# Patient Record
Sex: Female | Born: 1955 | Race: Black or African American | Hispanic: No | Marital: Married | Smoking: Never smoker
Health system: Southern US, Community
[De-identification: ages and names within clinical notes are randomized; demographics above are authoritative.]

## PROBLEM LIST (undated history)

## (undated) DIAGNOSIS — Z973 Presence of spectacles and contact lenses: Secondary | ICD-10-CM

## (undated) DIAGNOSIS — M940 Chondrocostal junction syndrome [Tietze]: Secondary | ICD-10-CM

## (undated) DIAGNOSIS — M5412 Radiculopathy, cervical region: Secondary | ICD-10-CM

## (undated) DIAGNOSIS — Z9109 Other allergy status, other than to drugs and biological substances: Secondary | ICD-10-CM

## (undated) DIAGNOSIS — T7840XA Allergy, unspecified, initial encounter: Secondary | ICD-10-CM

## (undated) DIAGNOSIS — M722 Plantar fascial fibromatosis: Secondary | ICD-10-CM

## (undated) DIAGNOSIS — M549 Dorsalgia, unspecified: Secondary | ICD-10-CM

## (undated) DIAGNOSIS — M67912 Unspecified disorder of synovium and tendon, left shoulder: Secondary | ICD-10-CM

## (undated) DIAGNOSIS — M797 Fibromyalgia: Secondary | ICD-10-CM

## (undated) DIAGNOSIS — G43909 Migraine, unspecified, not intractable, without status migrainosus: Secondary | ICD-10-CM

## (undated) DIAGNOSIS — G5601 Carpal tunnel syndrome, right upper limb: Secondary | ICD-10-CM

## (undated) DIAGNOSIS — M1990 Unspecified osteoarthritis, unspecified site: Secondary | ICD-10-CM

## (undated) HISTORY — DX: Plantar fascial fibromatosis: M72.2

## (undated) HISTORY — DX: Other allergy status, other than to drugs and biological substances: Z91.09

## (undated) HISTORY — DX: Unspecified disorder of synovium and tendon, left shoulder: M67.912

## (undated) HISTORY — DX: Unspecified osteoarthritis, unspecified site: M19.90

## (undated) HISTORY — DX: Chondrocostal junction syndrome (tietze): M94.0

## (undated) HISTORY — DX: Fibromyalgia: M79.7

## (undated) HISTORY — DX: Radiculopathy, cervical region: M54.12

## (undated) HISTORY — DX: Carpal tunnel syndrome, right upper limb: G56.01

## (undated) HISTORY — PX: COLONOSCOPY: SHX174

## (undated) HISTORY — DX: Migraine, unspecified, not intractable, without status migrainosus: G43.909

## (undated) HISTORY — DX: Allergy, unspecified, initial encounter: T78.40XA

## (undated) HISTORY — DX: Dorsalgia, unspecified: M54.9

## (undated) HISTORY — PX: TUBAL LIGATION: SHX77

## (undated) HISTORY — PX: UPPER GASTROINTESTINAL ENDOSCOPY: SHX188

---

## 2010-11-11 ENCOUNTER — Encounter: Payer: Self-pay | Admitting: Family Medicine

## 2010-11-11 ENCOUNTER — Other Ambulatory Visit (HOSPITAL_COMMUNITY)
Admission: RE | Admit: 2010-11-11 | Discharge: 2010-11-11 | Disposition: A | Discharge status: Home / Self Care | Payer: 59 | Source: Ambulatory Visit | Attending: Family Medicine | Admitting: Family Medicine

## 2010-11-11 ENCOUNTER — Ambulatory Visit (INDEPENDENT_AMBULATORY_CARE_PROVIDER_SITE_OTHER): Payer: 59 | Admitting: Family Medicine

## 2010-11-11 VITALS — BP 118/80 | HR 72 | Temp 99.0°F | Ht 65.0 in | Wt 165.6 lb

## 2010-11-11 DIAGNOSIS — Z0000 Encounter for general adult medical examination without abnormal findings: Secondary | ICD-10-CM

## 2010-11-11 DIAGNOSIS — Z01419 Encounter for gynecological examination (general) (routine) without abnormal findings: Secondary | ICD-10-CM | POA: Insufficient documentation

## 2010-11-11 DIAGNOSIS — G5600 Carpal tunnel syndrome, unspecified upper limb: Secondary | ICD-10-CM | POA: Insufficient documentation

## 2010-11-11 DIAGNOSIS — J309 Allergic rhinitis, unspecified: Secondary | ICD-10-CM

## 2010-11-11 DIAGNOSIS — IMO0002 Reserved for concepts with insufficient information to code with codable children: Secondary | ICD-10-CM

## 2010-11-11 DIAGNOSIS — K219 Gastro-esophageal reflux disease without esophagitis: Secondary | ICD-10-CM

## 2010-11-11 DIAGNOSIS — M1710 Unilateral primary osteoarthritis, unspecified knee: Secondary | ICD-10-CM

## 2010-11-11 DIAGNOSIS — M179 Osteoarthritis of knee, unspecified: Secondary | ICD-10-CM

## 2010-11-11 DIAGNOSIS — Z1239 Encounter for other screening for malignant neoplasm of breast: Secondary | ICD-10-CM

## 2010-11-11 DIAGNOSIS — Z780 Asymptomatic menopausal state: Secondary | ICD-10-CM

## 2010-11-11 DIAGNOSIS — J302 Other seasonal allergic rhinitis: Secondary | ICD-10-CM

## 2010-11-11 DIAGNOSIS — M549 Dorsalgia, unspecified: Secondary | ICD-10-CM

## 2010-11-11 HISTORY — DX: Carpal tunnel syndrome, unspecified upper limb: G56.00

## 2010-11-11 MED ORDER — NAPHAZOLINE HCL 0.1 % OP SOLN: 2.0000 [drp] | Freq: Four times a day (QID) | OPHTHALMIC | Status: AC | PRN

## 2010-11-11 MED ORDER — NAPROXEN-ESOMEPRAZOLE 500-20 MG PO TBEC: 1.0000 | DELAYED_RELEASE_TABLET | Freq: Two times a day (BID) | ORAL | Status: DC

## 2010-11-11 NOTE — Patient Instructions (Signed)

## 2010-11-11 NOTE — Progress Notes (Signed)
Subjective:     Kimberly Escobar is a 55 y.o. female and is here for a comprehensive physical exam. The patient reports problems - multiple. Pt needs referral for ortho for oa and back pain,  GI for Genella Rife,  And Hydrographic surveyor for CTS.  Pt had all these appointment before she moved but had to cancel all when moving down here.     History   Social History  . Marital Status: Married    Spouse Name: N/A    Number of Children: 4  . Years of Education: N/A   Occupational History  . housewife Other   Social History Main Topics  . Smoking status: Never Smoker   . Smokeless tobacco: Never Used  . Alcohol Use: 1.8 oz/week    3 Glasses of wine per week  . Drug Use: No  . Sexually Active: Yes -- Female partner(s)   Other Topics Concern  . Not on file   Social History Narrative   Exercise --walk 3x a week   No health maintenance topics applied.  The following portions of the patient's history were reviewed and updated as appropriate: allergies, current medications, past family history, past medical history, past social history, past surgical history and problem list.  Review of Systems Review of Systems  Constitutional: Negative for activity change, appetite change and fatigue.  HENT: Negative for hearing loss, congestion, tinnitus and ear discharge.  dentist q64m Eyes: Negative for visual disturbance (see optho q1y -- vision corrected to 20/20 with glasses).  Respiratory: Negative for cough, chest tightness and shortness of breath.   Cardiovascular: Negative for chest pain, palpitations and leg swelling.  Gastrointestinal: Negative for abdominal pain, diarrhea, constipation and abdominal distention.  Genitourinary: Negative for urgency, frequency, decreased urine volume and difficulty urinating.  Musculoskeletal: Negative for back pain, arthralgias and gait problem.  Skin: Negative for color change, pallor and rash.  Neurological: Negative for dizziness, light-headedness, numbness and  headaches.  Hematological: Negative for adenopathy. Does not bruise/bleed easily.  Psychiatric/Behavioral: Negative for suicidal ideas, confusion, sleep disturbance, self-injury, dysphoric mood, decreased concentration and agitation.       Objective:    BP 118/80  Pulse 72  Temp(Src) 99 F (37.2 C) (Oral)  Ht 5\' 5"  (1.651 m)  Wt 165 lb 9.6 oz (75.116 kg)  BMI 27.56 kg/m2  SpO2 97% General appearance: alert, cooperative, appears stated age and no distress Head: Normocephalic, without obvious abnormality, atraumatic Eyes: conjunctivae/corneas clear. PERRL, EOM's intact. Fundi benign. Ears: normal TM's and external ear canals both ears Nose: Nares normal. Septum midline. Mucosa normal. No drainage or sinus tenderness. Throat: lips, mucosa, and tongue normal; teeth and gums normal Neck: no adenopathy, no carotid bruit, no JVD, supple, symmetrical, trachea midline and thyroid not enlarged, symmetric, no tenderness/mass/nodules Back: + low back pain ,  difficulty lying down Lungs: clear to auscultation bilaterally Breasts: normal appearance, no masses or tenderness Heart: regular rate and rhythm, S1, S2 normal, no murmur, click, rub or gallop Abdomen: soft, non-tender; bowel sounds normal; no masses,  no organomegaly Pelvic: cervix normal in appearance, external genitalia normal, no adnexal masses or tenderness, no cervical motion tenderness, rectovaginal septum normal, uterus normal size, shape, and consistency and vagina normal without discharge Extremities: extremities normal, atraumatic, no cyanosis or edema Pulses: 2+ and symmetric Skin: Skin color, texture, turgor normal. No rashes or lesions Lymph nodes: Cervical, supraclavicular, and axillary nodes normal. Neurologic: Alert and oriented X 3, normal strength and tone. Normal symmetric reflexes. Normal coordination and gait  psych-- no depression, no anxiety    Assessment:    Healthy female exam.  OA--- f/u ortho Low back  pain---pt saw ortho in [place]---we will refer her to ortho and send for records allergies  ---naphcon eye drops gerd---- pt needed egd--had it scheduled in [place] but had to cancel it when she moved--we are sending for her records Plan:  Check fasting labs Schedule--mammo, bmd ghm utd    See After Visit Summary for Counseling Recommendations

## 2010-11-12 ENCOUNTER — Encounter: Payer: Self-pay | Admitting: Gastroenterology

## 2010-11-18 ENCOUNTER — Other Ambulatory Visit: Payer: Self-pay | Admitting: Family Medicine

## 2010-11-18 DIAGNOSIS — Z0000 Encounter for general adult medical examination without abnormal findings: Secondary | ICD-10-CM

## 2010-11-19 ENCOUNTER — Other Ambulatory Visit (INDEPENDENT_AMBULATORY_CARE_PROVIDER_SITE_OTHER): Payer: 59

## 2010-11-19 DIAGNOSIS — Z0000 Encounter for general adult medical examination without abnormal findings: Secondary | ICD-10-CM

## 2010-11-19 LAB — LIPID PANEL
Cholesterol: 197 mg/dL (ref 0–200)
LDL Cholesterol: 121 mg/dL — ABNORMAL HIGH (ref 0–99)
Triglycerides: 74 mg/dL (ref 0.0–149.0)
VLDL: 14.8 mg/dL (ref 0.0–40.0)

## 2010-11-19 LAB — HEPATIC FUNCTION PANEL
Albumin: 4.4 g/dL (ref 3.5–5.2)
Total Protein: 8.1 g/dL (ref 6.0–8.3)

## 2010-11-19 LAB — CBC WITH DIFFERENTIAL/PLATELET
Basophils Relative: 0.6 % (ref 0.0–3.0)
Eosinophils Relative: 0.9 % (ref 0.0–5.0)
HCT: 38.4 % (ref 36.0–46.0)
Hemoglobin: 12.9 g/dL (ref 12.0–15.0)
Lymphs Abs: 2.1 10*3/uL (ref 0.7–4.0)
MCV: 96.3 fl (ref 78.0–100.0)
Monocytes Absolute: 0.3 10*3/uL (ref 0.1–1.0)
Monocytes Relative: 7.1 % (ref 3.0–12.0)
Platelets: 267 10*3/uL (ref 150.0–400.0)
RBC: 3.99 Mil/uL (ref 3.87–5.11)
WBC: 4.6 10*3/uL (ref 4.5–10.5)

## 2010-11-19 LAB — BASIC METABOLIC PANEL
BUN: 17 mg/dL (ref 6–23)
Chloride: 107 mEq/L (ref 96–112)
GFR: 110.88 mL/min (ref >60.00)
Potassium: 3.9 mEq/L (ref 3.5–5.1)
Sodium: 141 mEq/L (ref 135–145)

## 2010-11-19 NOTE — Progress Notes (Signed)
Labs only

## 2010-12-03 ENCOUNTER — Ambulatory Visit: Payer: 59 | Admitting: Gastroenterology

## 2010-12-06 ENCOUNTER — Encounter: Payer: Self-pay | Admitting: Family Medicine

## 2010-12-10 ENCOUNTER — Ambulatory Visit (INDEPENDENT_AMBULATORY_CARE_PROVIDER_SITE_OTHER): Payer: 59 | Admitting: Gastroenterology

## 2010-12-10 ENCOUNTER — Encounter: Payer: Self-pay | Admitting: Gastroenterology

## 2010-12-10 VITALS — BP 116/70 | HR 72 | Ht 65.0 in | Wt 163.4 lb

## 2010-12-10 DIAGNOSIS — K219 Gastro-esophageal reflux disease without esophagitis: Secondary | ICD-10-CM

## 2010-12-10 HISTORY — DX: Gastro-esophageal reflux disease without esophagitis: K21.9

## 2010-12-10 NOTE — Progress Notes (Signed)
HPI: This is a   very pleasant 55 year old woman who I am meeting for the first time today.  She has been "spitting up after she eats." depends on amount of food that she eats.  In a week she will "spit up" twice.  Large meals cuase it more often.  Liquidy belching, sometimes has acid taste to it.  No particular types of foods.    This has been going on for 3 years  She was given some reglan many month ago, she tried it a few times without noticing any improving.  Overall her wieght is up slightly.  No overt GI bleeding.  No significant pains.  Recent cbc was normal.  Recently moved from [place], husband relocated.   She had a colonoscopy about 2 years, no polyps were found.  She is on vivomovo bid.   Review of systems: Pertinent positive and negative review of systems were noted in the above HPI section. Complete review of systems was performed and was otherwise normal.    Past Medical History  Diagnosis Date  . Arthritis   . Migraines   . Environmental allergies     Past Surgical History  Procedure Date  . Cesarean section     x's 4    Current Outpatient Prescriptions  Medication Sig Dispense Refill  . cyclobenzaprine (FLEXERIL) 10 MG tablet Take 10 mg by mouth 3 (three) times daily as needed.        . metoCLOPramide (REGLAN) 10 MG tablet Take 10 mg by mouth 3 (three) times daily.        . naproxen (NAPROSYN) 375 MG tablet Take 375 mg by mouth 2 (two) times daily with a meal.        . Naproxen-Esomeprazole (VIMOVO) 500-20 MG TBEC Take 1 tablet by mouth 2 (two) times daily.  60 tablet  5  . traMADol (ULTRAM) 50 MG tablet Take 50 mg by mouth every 6 (six) hours as needed. Maximum dose= 8 tablets per day         Allergies as of 12/10/2010  . (No Known Allergies)    Family History  Problem Relation Age of Onset  . Arthritis    . Heart disease Mother   . Heart disease Sister     Pacemaker  . Hypertension Mother   . Hypertension Sister   . Stroke Sister     oldest  sibling  . Breast cancer Sister     Oldest sibling    History   Social History  . Marital Status: Married    Spouse Name: N/A    Number of Children: 4  . Years of Education: N/A   Occupational History  . housewife Other   Social History Main Topics  . Smoking status: Never Smoker   . Smokeless tobacco: Never Used  . Alcohol Use: 1.8 oz/week    3 Glasses of wine per week  . Drug Use: No  . Sexually Active: Yes -- Female partner(s)   Other Topics Concern  . Not on file   Social History Narrative   Exercise --walk 3x a week       Physical Exam: BP 116/70  Pulse 72  Ht 5\' 5"  (1.651 m)  Wt 163 lb 6.4 oz (74.118 kg)  BMI 27.19 kg/m2 Constitutional: generally well-appearing Psychiatric: alert and oriented x3 Eyes: extraocular movements intact Mouth: oral pharynx moist, no lesions Neck: supple no lymphadenopathy Cardiovascular: heart regular rate and rhythm Lungs: clear to auscultation bilaterally Abdomen: soft, nontender, nondistended, no  obvious ascites, no peritoneal signs, normal bowel sounds Extremities: no lower extremity edema bilaterally Skin: no lesions on visible extremities    Assessment and plan: 55 y.o. female with  intermittent vomiting, regurgitation likely GERD related  She takes 1 g of naproxen a day for her knees and this concerned because gastritis, peptic ulcer disease. Tylenol does not help unfortunately. I think we should proceed with EGD at her soonest convenience to check for peptic ulcer disease, H. pylori, gastritis, significant GERD damage. Further recommendations based on those findings. She was recommended to take Reglan several months ago by a doctor in [place] before she moved here, she has not been taking it very often and I removed it from her medication list.

## 2010-12-10 NOTE — Patient Instructions (Addendum)
We will get records from Dr. Corwin Levins in [facility]) for your 2010 colonoscopy. You will be set up for an upper endoscopy. GERD handout.    Gastroesophageal reflux disease (GERD) happens when acid from your stomach flows up into the esophagus. When acid comes in contact with the esophagus, the acid causes soreness (inflammation) in the esophagus. Over time, GERD may create small holes (ulcers) in the lining of the esophagus. CAUSES   Increased body weight. This puts pressure on the stomach, making acid rise from the stomach into the esophagus.   Smoking. This increases acid production in the stomach.   Drinking alcohol. This causes decreased pressure in the lower esophageal sphincter (valve or ring of muscle between the esophagus and stomach), allowing acid from the stomach into the esophagus.   Late evening meals and a full stomach. This increases pressure and acid production in the stomach.   A malformed lower esophageal sphincter.  Sometimes, no cause is found. SYMPTOMS   Burning pain in the lower part of the mid-chest behind the breastbone and in the mid-stomach area. This may occur twice a week or more often.   Trouble swallowing.   Sore throat.   Dry cough.   Asthma-like symptoms including chest tightness, shortness of breath, or wheezing.  DIAGNOSIS  Your caregiver may be able to diagnose GERD based on your symptoms. In some cases, X-rays and other tests may be done to check for complications or to check the condition of your stomach and esophagus. TREATMENT  Your caregiver may recommend over-the-counter or prescription medicines to help decrease acid production. Ask your caregiver before starting or adding any new medicines.  HOME CARE INSTRUCTIONS   Change the factors that you can control. Ask your caregiver for guidance concerning weight loss, quitting smoking, and alcohol consumption.   Avoid foods and drinks that make your symptoms worse, such as:     Caffeine or alcoholic drinks.   Chocolate.   Peppermint or mint flavorings.   Garlic and onions.   Spicy foods.   Citrus fruits, such as oranges, lemons, or limes.   Tomato-based foods such as sauce, chili, salsa, and pizza.   Fried and fatty foods.   Avoid lying down for the 3 hours prior to your bedtime or prior to taking a nap.   Eat small, frequent meals instead of large meals.   Wear loose-fitting clothing. Do not wear anything tight around your waist that causes pressure on your stomach.   Raise the head of your bed 6 to 8 inches with wood blocks to help you sleep. Extra pillows will not help.   Only take over-the-counter or prescription medicines for pain, discomfort, or fever as directed by your caregiver.   Do not take aspirin, ibuprofen, or other nonsteroidal anti-inflammatory drugs (NSAIDs).  SEEK IMMEDIATE MEDICAL CARE IF:   You have pain in your arms, neck, jaw, teeth, or back.   Your pain increases or changes in intensity or duration.   You develop nausea, vomiting, or sweating (diaphoresis).   You develop shortness of breath, or you faint.   Your vomit is green, yellow, black, or looks like coffee grounds or blood.   Your stool is red, bloody, or black.  These symptoms could be signs of other problems, such as heart disease, gastric bleeding, or esophageal bleeding. MAKE SURE YOU:   Understand these instructions.   Will watch your condition.   Will get help right away if you are not doing well or  get worse.  Document Released: 10/09/2004 Document Revised: 09/11/2010 Document Reviewed: 07/19/2010 [facility] Patient Information 2012 [place], [place].

## 2010-12-23 ENCOUNTER — Encounter: Payer: Self-pay | Admitting: Gastroenterology

## 2010-12-23 ENCOUNTER — Ambulatory Visit (AMBULATORY_SURGERY_CENTER): Payer: 59 | Admitting: Gastroenterology

## 2010-12-23 VITALS — BP 129/68 | HR 79 | Temp 97.7°F | Resp 16 | Ht 65.0 in | Wt 163.0 lb

## 2010-12-23 DIAGNOSIS — K2940 Chronic atrophic gastritis without bleeding: Secondary | ICD-10-CM

## 2010-12-23 DIAGNOSIS — K219 Gastro-esophageal reflux disease without esophagitis: Secondary | ICD-10-CM

## 2010-12-23 DIAGNOSIS — K2970 Gastritis, unspecified, without bleeding: Secondary | ICD-10-CM

## 2010-12-23 DIAGNOSIS — A048 Other specified bacterial intestinal infections: Secondary | ICD-10-CM

## 2010-12-23 MED ORDER — OMEPRAZOLE MAGNESIUM 20 MG PO TBEC: 20.0000 mg | DELAYED_RELEASE_TABLET | Freq: Every day | ORAL | Status: DC

## 2010-12-23 MED ORDER — SODIUM CHLORIDE 0.9 % IV SOLN: 500.0000 mL | INTRAVENOUS | Status: DC

## 2010-12-23 NOTE — Patient Instructions (Signed)
Please refer to your blue and neon green sheets for instructions regarding diet and activity for the rest of today.  You may resume your medications as you would normally take them.   You will receive a letter in the mail in about two weeks regarding the results of the biopsies taken today.  Gastritis Gastritis is an inflammation (the body's way of reacting to injury and/or infection) of the stomach. It is often caused by viral or bacterial (germ) infections. It can also be caused by chemicals (including alcohol) and medications. This illness may be associated with generalized malaise (feeling tired, not well), cramps, and fever. The illness may last 2 to 7 days. If symptoms of gastritis continue, gastroscopy (looking into the stomach with a telescope-like instrument), biopsy (taking tissue samples), and/or blood tests may be necessary to determine the cause. Antibiotics will not affect the illness unless there is a bacterial infection present. One common bacterial cause of gastritis is an organism known as H. Pylori. This can be treated with antibiotics. Other forms of gastritis are caused by too much acid in the stomach. They can be treated with medications such as H2 blockers and antacids. Home treatment is usually all that is needed. Young children will quickly become dehydrated (loss of body fluids) if vomiting and diarrhea are both present. Medications may be given to control nausea. Medications are usually not given for diarrhea unless especially bothersome. Some medications slow the removal of the virus from the gastrointestinal tract. This slows down the healing process. HOME CARE INSTRUCTIONS Home care instructions for nausea and vomiting:  For adults: drink small amounts of fluids often. Drink at least 2 quarts a day. Take sips frequently. Do not drink large amounts of fluid at one time. This may worsen the nausea.   Only take over-the-counter or prescription medicines for pain, discomfort,  or fever as directed by your caregiver.   Drink clear liquids only. Those are anything you can see through such as water, broth, or soft drinks.   Once you are keeping clear liquids down, you may start full liquids, soups, juices, and ice cream or sherbet. Slowly add bland (plain, not spicy) foods to your diet.  Home care instructions for diarrhea:  Diarrhea can be caused by bacterial infections or a virus. Your condition should improve with time, rest, fluids, and/or anti-diarrheal medication.   Until your diarrhea is under control, you should drink clear liquids often in small amounts. Clear liquids include: water, broth, jell-o water and weak tea.  Avoid:  Milk.   Fruits.   Tobacco.   Alcohol.   Extremely hot or cold fluids.   Too much intake of anything at one time.  When your diarrhea stops you may add the following foods, which help the stool to become more formed:  Rice.   Bananas.   Apples without skin.   Dry toast.  Once these foods are tolerated you may add low-fat yogurt and low-fat cottage cheese. They will help to restore the normal bacterial balance in your bowel. Wash your hands well to avoid spreading bacteria (germ) or virus. SEEK IMMEDIATE MEDICAL CARE IF:   You are unable to keep fluids down.   Vomiting or diarrhea become persistent (constant).   Abdominal pain develops, increases, or localizes. (Right sided pain can be appendicitis. Left sided pain in adults can be diverticulitis.)   You develop a fever (an oral temperature above 102 F (38.9 C)).   Diarrhea becomes excessive or contains blood or mucus.     You have excessive weakness, dizziness, fainting or extreme thirst.   You are not improving or you are getting worse.   You have any other questions or concerns.  Document Released: 12/24/2000 Document Revised: 09/11/2010 Document Reviewed: 12/30/2004 [employer] Patient Information 2012 [employer]. 

## 2010-12-23 NOTE — Progress Notes (Signed)
Patient did not experience any of the following events: a burn prior to discharge; a fall within the facility; wrong site/side/patient/procedure/implant event; or a hospital transfer or hospital admission upon discharge from the facility. (G8907) Patient did not have preoperative order for IV antibiotic SSI prophylaxis. (G8918)  

## 2010-12-23 NOTE — Op Note (Signed)
[facility] [address] [place], [place]  [address]  ENDOSCOPY PROCEDURE REPORT  PATIENT:  Kimberly, Escobar  MR#:  [id number] BIRTHDATE:  May 03, 1955, 55 yrs. old  GENDER:  female ENDOSCOPIST:  Rachael Fee, MD Referred by:  Loreen Freud, DO PROCEDURE DATE:  12/23/2010 PROCEDURE:  EGD with biopsy, 62[address] ASA CLASS:  Class II INDICATIONS:  GERD, intermittent vomiting, daily NSAIDs MEDICATIONS:   Fentanyl 50 mcg IV, These medications were titrated to patient response per physician's verbal order, Versed 6 mg IV TOPICAL ANESTHETIC:  Cetacaine Spray  DESCRIPTION OF PROCEDURE:   After the risks benefits and alternatives of the procedure were thoroughly explained, informed consent was obtained.  The LB [id number]-[id number] endoscope was introduced through the mouth and advanced to the second portion of the duodenum, without limitations.  The instrument was slowly withdrawn as the mucosa was fully examined. <<PROCEDUREIMAGES>> There was mild, non-specific gastritis. This was biopsied, sent to pathology (jar 1) (see image5).  Otherwise the examination was normal (see image6, image4, image3, image2, and image1). Retroflexed views revealed no abnormalities.    The scope was then withdrawn from the patient and the procedure completed. COMPLICATIONS:  None  ENDOSCOPIC IMPRESSION: 1) Mild gastritis, biopsied for H. pylori 2) Otherwise normal examination  RECOMMENDATIONS: Please start OTC PPI (such as prilosec, prevacid, or generic equivalent).  One pill, once daily, best taken 20-30 min before BF meal.  ______________________________ Rachael Fee, MD  n. eSIGNED:   Rachael Fee at 12/23/2010 09:10 AM  [place], [phone]

## 2010-12-24 ENCOUNTER — Telehealth: Payer: Self-pay | Admitting: *Deleted

## 2010-12-24 ENCOUNTER — Telehealth: Payer: Self-pay | Admitting: Family Medicine

## 2010-12-24 MED ORDER — VALACYCLOVIR HCL 500 MG PO TABS: 500.0000 mg | ORAL_TABLET | Freq: Every day | ORAL | Status: DC

## 2010-12-24 NOTE — Telephone Encounter (Signed)
Rx prescribed  by previous PCP please advise     KP

## 2010-12-24 NOTE — Telephone Encounter (Signed)
Ok to rx--- #30  5 refills

## 2010-12-24 NOTE — Telephone Encounter (Signed)
No answer so left message for pt as ok'd in admitting yesterday. ewm

## 2010-12-24 NOTE — Telephone Encounter (Signed)
Noted pt medication has already been sent per noted in chart

## 2010-12-24 NOTE — Telephone Encounter (Signed)
Patient needs refill for valacyclovir 500 mg 1 by mouth daily - [facility] [address] - this was prescribed by previous pcp

## 2010-12-24 NOTE — Telephone Encounter (Signed)
Faxed.   KP 

## 2010-12-26 ENCOUNTER — Other Ambulatory Visit: Payer: Self-pay | Admitting: *Deleted

## 2010-12-26 NOTE — Telephone Encounter (Signed)
Pt left vm stating her acyclovir has not been filled yet, contacted mariah at [facility] in [place] at [email or web address] noted in chart and the medication is there. Left vm to alert pt the medication is at pharmacy ready for pick up

## 2010-12-31 ENCOUNTER — Other Ambulatory Visit: Payer: Self-pay

## 2010-12-31 MED ORDER — AMOXICILL-CLARITHRO-LANSOPRAZ PO MISC: Freq: Two times a day (BID) | ORAL | Status: DC

## 2011-01-06 ENCOUNTER — Encounter: Payer: Self-pay | Admitting: Family Medicine

## 2011-01-21 ENCOUNTER — Ambulatory Visit (INDEPENDENT_AMBULATORY_CARE_PROVIDER_SITE_OTHER): Payer: 59 | Admitting: Family Medicine

## 2011-01-21 ENCOUNTER — Encounter: Payer: Self-pay | Admitting: Family Medicine

## 2011-01-21 VITALS — BP 126/82 | HR 87 | Temp 98.6°F | Wt 160.0 lb

## 2011-01-21 DIAGNOSIS — G4700 Insomnia, unspecified: Secondary | ICD-10-CM

## 2011-01-21 DIAGNOSIS — A048 Other specified bacterial intestinal infections: Secondary | ICD-10-CM

## 2011-01-21 MED ORDER — ZOLPIDEM TARTRATE 5 MG PO TABS: 5.0000 mg | ORAL_TABLET | Freq: Every evening | ORAL | Status: DC | PRN

## 2011-01-21 NOTE — Patient Instructions (Signed)
Helicobacter Pylori and Ulcer Disease An ulcer may be in your stomach (gastric ulcer) or in the first part of your small bowel, which is called the duodenum (duodenal ulcer). An ulcer is a break in the stomach or duodenum lining. The break wears down into the deeper tissue. Helicobacter pylori (H. pylori) is a type of germ (bacteria) that may cause the majority of gastric or duodenal ulcers. CAUSES   A germ (bacterium). H. pylori can weaken the protective mucous coating of the stomach and duodenum. This allows acid to get through to the sensitive lining of the stomach or duodenum and an ulcer can then form.   Certain medications.   Using substances that can bother the lining of the stomach (alcohol, tobacco or medications such as Advil or Motrin) in the presence of H.pylori infection. This can increase the chances of getting an ulcer.   Cancer (rarely).  Most people infected with H. pylori do not get ulcers. It is not known how people catch H. pylori. It may be through food or water. H. pylori has been found in the saliva of some infected people. Therefore, the bacteria may also spread through mouth-to-mouth contact such as kissing. SYMPTOMS  The problems (symptoms) of ulcer disease are usually:  A burning or gnawing of the mid-upper belly (abdomen). This is often worse on an empty stomach. It may get better with food. This may be associated with feeling sick to your stomach (nausea), bloating and vomiting.   If the ulcer results in bleeding, it can cause:   Black, tarry stools.   Throwing up bright red blood.   Throwing up coffee ground looking materials.  With severe bleeding, there may be loss of consciousness and shock. Besides ulcer disease, H. pylori can also cause chronic gastritis (irritation of the lining of the stomach without ulcer) or stomach acid-type discomfort. You may not have symptoms even though you have an H. pylori infection. Although this is an infection, you may not  have usual infection symptoms (such as fever). DIAGNOSIS  Ulcer disease can be diagnosed in many different ways. If you have an ulcer, it is important to know whether or not it is caused by H. Pylori. Treatment for an ulcer caused by H. pylori is different from that for an ulcer with other causes. The best way to detect H. pylori is taking tissue directly from the ulcer during an endoscopy test.   An endoscopy is an exam that uses an endoscope. This is a thin, lighted tube with a small camera on the end. It is like a flexible telescope. The patient is given a drug to make them calm (sedative). The caregiver eases the endoscope into the mouth and down the throat to the stomach and duodenum. This allows the doctor to see the lining of the esophagus, stomach and duodenum.   If an endoscopy is not needed, then H. pylori can be detected with tests of the blood, stool or even breath.  TREATMENT   H. pylori peptic ulcer treatment usually involves a combination of:   Medicines that kill germs (antibiotics).   Acid suppressors.   Stomach protectors.   The use of only one medication to treat H. pylori is not recommended. The most proven treatment is a 2 week course of treatment called triple therapy. It involves taking two antibiotics to kill the bacteria and either an acid suppressor or stomach-lining shield. Two-week triple therapy reduces ulcer symptoms, kills the bacteria, and prevents ulcers from coming back in many   patients.   Unfortunately, patients may find triple therapy hard to do. This is because it involves taking as many as 20 pills a day. Also, the antibiotics used in triple therapy may cause mild side effects. These include nausea, vomiting, diarrhea, dark stools, a metallic taste in the mouth, dizziness, headache and yeast infections in women. Talk to your caregiver if you have any of these side effects.  HOME CARE INSTRUCTIONS   Take your medications as directed and for as long as  prescribed. Contact your caregiver if you have problems or side effects from your medications.   Continue regular work and usual activities unless told otherwise by your caregiver.   Avoid tobacco, alcohol and caffeine. Tobacco use will decrease and slow healing.   Avoid medications that are harmful. This includes aspirin and NSAIDS such as ibuprofen and naproxen.   Avoid foods that seem to aggravate or cause discomfort.   There are many over-the-counter products available to control stomach acid and other symptoms. Discuss these with your caregiver before using them. Do not  stop taking prescription medications for over-the-counter medications without talking with your caregiver.   Special diets are not usually needed.   Keep any follow-up appointments and blood tests as directed.  SEEK MEDICAL CARE IF:   Your pain or other ulcer symptoms do not improve within a few days of starting treatment.   You develop diarrhea. This can be a problem related to certain treatments.   You have ongoing indigestion or heartburn even if your main ulcer symptoms are improved.   You think you have any side effects from your medications or if you do not understand how to use your medications right.  SEEK IMMEDIATE MEDICAL CARE IF:  Any of the following happen:  You develop bright red, rectal bleeding.   You develop dark black, tarry stools.   You throw up (vomit) blood.   You become light-headed, weak, have fainting episodes, or become sweaty, cold and clammy.   You have severe abdominal pain not controlled by medications. Do not take pain medications unless ordered by your caregiver.  MAKE SURE YOU:   Understand these instructions.   Will watch your condition.   Will get help right away if you are not doing well or get worse.  Document Released: 03/22/2003 Document Revised: 09/11/2010 Document Reviewed: 08/19/2007 [facility] Patient Information 2012 [place], [place].  Insomnia Insomnia is  frequent trouble falling and/or staying asleep. Insomnia can be a long term problem or a short term problem. Both are common. Insomnia can be a short term problem when the wakefulness is related to a certain stress or worry. Long term insomnia is often related to ongoing stress during waking hours and/or poor sleeping habits. Overtime, sleep deprivation itself can make the problem worse. Every little thing feels more severe because you are overtired and your ability to cope is decreased. CAUSES   Stress, anxiety, and depression.   Poor sleeping habits.   Distractions such as TV in the bedroom.   Naps close to bedtime.   Engaging in emotionally charged conversations before bed.   Technical reading before sleep.   Alcohol and other sedatives. They may make the problem worse. They can hurt normal sleep patterns and normal dream activity.   Stimulants such as caffeine for several hours prior to bedtime.   Pain syndromes and shortness of breath can cause insomnia.   Exercise late at night.   Changing time zones may cause sleeping problems (jet lag).  It is  sometimes helpful to have someone observe your sleeping patterns. They should look for periods of not breathing during the night (sleep apnea). They should also look to see how long those periods last. If you live alone or observers are uncertain, you can also be observed at a sleep clinic where your sleep patterns will be professionally monitored. Sleep apnea requires a checkup and treatment. Give your caregivers your medical history. Give your caregivers observations your family has made about your sleep.  SYMPTOMS   Not feeling rested in the morning.   Anxiety and restlessness at bedtime.   Difficulty falling and staying asleep.  TREATMENT   Your caregiver may prescribe treatment for an underlying medical disorders. Your caregiver can give advice or help if you are using alcohol or other drugs for self-medication. Treatment of  underlying problems will usually eliminate insomnia problems.   Medications can be prescribed for short time use. They are generally not recommended for lengthy use.   Over-the-counter sleep medicines are not recommended for lengthy use. They can be habit forming.   You can promote easier sleeping by making lifestyle changes such as:   Using relaxation techniques that help with breathing and reduce muscle tension.   Exercising earlier in the day.   Changing your diet and the time of your last meal. No night time snacks.   Establish a regular time to go to bed.   Counseling can help with stressful problems and worry.   Soothing music and white noise may be helpful if there are background noises you cannot remove.   Stop tedious detailed work at least one hour before bedtime.  HOME CARE INSTRUCTIONS   Keep a diary. Inform your caregiver about your progress. This includes any medication side effects. See your caregiver regularly. Take note of:   Times when you are asleep.   Times when you are awake during the night.   The quality of your sleep.   How you feel the next day.  This information will help your caregiver care for you.  Get out of bed if you are still awake after 15 minutes. Read or do some quiet activity. Keep the lights down. Wait until you feel sleepy and go back to bed.   Keep regular sleeping and waking hours. Avoid naps.   Exercise regularly.   Avoid distractions at bedtime. Distractions include watching television or engaging in any intense or detailed activity like attempting to balance the household checkbook.   Develop a bedtime ritual. Keep a familiar routine of bathing, brushing your teeth, climbing into bed at the same time each night, listening to soothing music. Routines increase the success of falling to sleep faster.   Use relaxation techniques. This can be using breathing and muscle tension release routines. It can also include visualizing peaceful  scenes. You can also help control troubling or intruding thoughts by keeping your mind occupied with boring or repetitive thoughts like the old concept of counting sheep. You can make it more creative like imagining planting one beautiful flower after another in your backyard garden.   During your day, work to eliminate stress. When this is not possible use some of the previous suggestions to help reduce the anxiety that accompanies stressful situations.  MAKE SURE YOU:   Understand these instructions.   Will watch your condition.   Will get help right away if you are not doing well or get worse.  Document Released: 12/28/1999 Document Revised: 09/11/2010 Document Reviewed: 01/27/2007 [employer] Patient Information 2012  [employer].

## 2011-01-22 ENCOUNTER — Encounter: Payer: Self-pay | Admitting: Family Medicine

## 2011-01-22 DIAGNOSIS — G4700 Insomnia, unspecified: Secondary | ICD-10-CM | POA: Insufficient documentation

## 2011-01-22 HISTORY — DX: Insomnia, unspecified: G47.00

## 2011-01-22 NOTE — Progress Notes (Signed)
  Subjective:    Patient ID: Kimberly Escobar, female    DOB: 01-28-1955, 56 y.o.   MRN: [id number]  HPI Pt here to review labs.  Pt was diagnosed with H pylori and took 2 prevpacs--- she has finished them and pt still has reflux symptoms.      Review of Systems    as above Objective:   Physical Exam  Constitutional: She is oriented to person, place, and time. She appears well-developed and well-nourished.  Pulmonary/Chest: Effort normal and breath sounds normal.  Abdominal: Soft. Bowel sounds are normal. She exhibits no distension and no mass. There is no tenderness. There is no rebound and no guarding.  Neurological: She is alert and oriented to person, place, and time.  Psychiatric: She has a normal mood and affect. Her behavior is normal. Judgment and thought content normal.          Assessment & Plan:  H pylori---  Finished prevpac and take prilosec otc 1 po qd                  F/u if symptoms do not improve Labs were great ---recheck 1year

## 2011-02-17 ENCOUNTER — Telehealth: Payer: Self-pay | Admitting: Family Medicine

## 2011-02-17 DIAGNOSIS — M1710 Unilateral primary osteoarthritis, unspecified knee: Secondary | ICD-10-CM

## 2011-02-17 DIAGNOSIS — IMO0002 Reserved for concepts with insufficient information to code with codable children: Secondary | ICD-10-CM

## 2011-02-17 NOTE — Telephone Encounter (Signed)
Ok to put referral in 

## 2011-04-09 ENCOUNTER — Other Ambulatory Visit (INDEPENDENT_AMBULATORY_CARE_PROVIDER_SITE_OTHER): Payer: Medicare Other

## 2011-04-09 DIAGNOSIS — M949 Disorder of cartilage, unspecified: Secondary | ICD-10-CM

## 2011-04-09 DIAGNOSIS — M899 Disorder of bone, unspecified: Secondary | ICD-10-CM

## 2011-04-09 LAB — CALCIUM: Calcium: 9.3 mg/dL (ref 8.4–10.5)

## 2011-07-10 ENCOUNTER — Encounter: Payer: Medicare Other | Admitting: Family Medicine

## 2011-07-29 ENCOUNTER — Ambulatory Visit (INDEPENDENT_AMBULATORY_CARE_PROVIDER_SITE_OTHER): Payer: 59 | Admitting: Family Medicine

## 2011-07-29 ENCOUNTER — Encounter: Payer: Self-pay | Admitting: Family Medicine

## 2011-07-29 ENCOUNTER — Telehealth: Payer: Self-pay | Admitting: Family Medicine

## 2011-07-29 VITALS — BP 100/68 | HR 75 | Temp 98.6°F | Wt 164.0 lb

## 2011-07-29 DIAGNOSIS — H6690 Otitis media, unspecified, unspecified ear: Secondary | ICD-10-CM

## 2011-07-29 DIAGNOSIS — H6692 Otitis media, unspecified, left ear: Secondary | ICD-10-CM

## 2011-07-29 DIAGNOSIS — H04129 Dry eye syndrome of unspecified lacrimal gland: Secondary | ICD-10-CM

## 2011-07-29 MED ORDER — AMOXICILLIN-POT CLAVULANATE 875-125 MG PO TABS: 1.0000 | ORAL_TABLET | Freq: Two times a day (BID) | ORAL | Status: AC

## 2011-07-29 MED ORDER — ANTIPYRINE-BENZOCAINE 5.4-1.4 % OT SOLN: 3.0000 [drp] | Freq: Four times a day (QID) | OTIC | Status: AC | PRN

## 2011-07-29 MED ORDER — CYCLOSPORINE 0.05 % OP EMUL: 1.0000 [drp] | Freq: Two times a day (BID) | OPHTHALMIC | Status: AC

## 2011-07-29 NOTE — Telephone Encounter (Signed)
Seen today.   KP 

## 2011-07-29 NOTE — Patient Instructions (Signed)
Otitis Media, Adult  A middle ear infection is an infection in the space behind the eardrum. The medical name for this is "otitis media." It may happen after a common cold. It is caused by a germ that starts growing in that space. You may feel swollen glands in your neck on the side of the ear infection.  HOME CARE INSTRUCTIONS   · Take your medicine as directed until it is gone, even if you feel better after the first few days.  · Only take over-the-counter or prescription medicines for pain, discomfort, or fever as directed by your caregiver.  · Occasional use of a nasal decongestant a couple times per day may help with discomfort and help the eustachian tube to drain better.  Follow up with your caregiver in 10 to 14 days or as directed, to be certain that the infection has cleared. Not keeping the appointment could result in a chronic or permanent injury, pain, hearing loss and disability. If there is any problem keeping the appointment, you must call back to this facility for assistance.  SEEK IMMEDIATE MEDICAL CARE IF:   · You are not getting better in 2 to 3 days.  · You have pain that is not controlled with medication.  · You feel worse instead of better.  · You cannot use the medication as directed.  · You develop swelling, redness or pain around the ear or stiffness in your neck.  MAKE SURE YOU:   · Understand these instructions.  · Will watch your condition.  · Will get help right away if you are not doing well or get worse.  Document Released: 10/05/2003 Document Revised: 12/19/2010 Document Reviewed: 08/06/2007  [employer]® Patient Information ©2012 [employer].

## 2011-07-29 NOTE — Progress Notes (Signed)
  Subjective:     Kimberly Escobar is a 56 y.o. female who presents with ear pain and possible ear infection. Symptoms include: left ear pain and plugged sensation in the left ear. Onset of symptoms was several days ago, and have been gradually worsening since that time. Associated symptoms include: none.  Patient denies: achiness, chills, congestion, coryza, fever , headache, low grade fever, non productive cough, post nasal drip, productive cough, sinus pressure, sneezing and sore throat. She is drinking plenty of fluids.  The following portions of the patient's history were reviewed and updated as appropriate: allergies, current medications, past family history, past medical history, past social history, past surgical history and problem list.  Review of Systems Pertinent items are noted in HPI.   Objective:    BP 100/68  Pulse 75  Temp 98.6 F (37 C) (Oral)  Wt 164 lb (74.39 kg)  SpO2 97% General:  alert, cooperative, appears stated age and no distress  Right Ear: normal  Left Ear: diminished mobility--+ errythema,  + fluid  Mouth:  lips, mucosa, and tongue normal; teeth and gums normal  Neck: no adenopathy, no carotid bruit, no JVD, supple, symmetrical, trachea midline and thyroid not enlarged, symmetric, no tenderness/mass/nodules     Assessment:    Left acute otitis media   Plan:    Treatment: Augmentin.  Auralgan  OTC analgesia as needed. Fluids, rest, avoid carbonated/alcoholic and caffeinated beverages.  Follow up in several days if not improving.

## 2011-07-29 NOTE — Telephone Encounter (Signed)
Caller: [place]/Patient; PCP: Lelon Perla.; CB#: [phone]; ; ; Call regarding Ear Pain;  Patient states she developed left ear pain, onset 07/28/11. States pain worse at night. Patient describes as intermittent "sharp" pain.  Denies ear drainage or swelling. Afebrile. Triage per Ear Symptoms Protocol. No emergent sx identified. Care advice given per guidelines. Patient advised to elevate head, warm compresses to ear. Advised to keep water out of ear. Patient advised Ibuprofen 800mg . q 8 hours prn for pain, advised to take with food. Call back parameters reviewed. Patient verbalizes understanding. Appt. scheduled for 07/29/11 1445 with Dr. Laury Axon.

## 2011-10-24 ENCOUNTER — Ambulatory Visit (INDEPENDENT_AMBULATORY_CARE_PROVIDER_SITE_OTHER): Payer: 59 | Admitting: Family Medicine

## 2011-10-24 ENCOUNTER — Encounter: Payer: Self-pay | Admitting: Family Medicine

## 2011-10-24 ENCOUNTER — Telehealth: Payer: Self-pay | Admitting: Family Medicine

## 2011-10-24 VITALS — BP 126/82 | HR 89 | Temp 98.3°F | Ht 65.0 in | Wt 160.4 lb

## 2011-10-24 DIAGNOSIS — K0889 Other specified disorders of teeth and supporting structures: Secondary | ICD-10-CM

## 2011-10-24 DIAGNOSIS — M549 Dorsalgia, unspecified: Secondary | ICD-10-CM

## 2011-10-24 DIAGNOSIS — K089 Disorder of teeth and supporting structures, unspecified: Secondary | ICD-10-CM

## 2011-10-24 DIAGNOSIS — B009 Herpesviral infection, unspecified: Secondary | ICD-10-CM

## 2011-10-24 DIAGNOSIS — R6884 Jaw pain: Secondary | ICD-10-CM | POA: Insufficient documentation

## 2011-10-24 MED ORDER — VALACYCLOVIR HCL 500 MG PO TABS: 500.0000 mg | ORAL_TABLET | Freq: Every day | ORAL | Status: DC

## 2011-10-24 MED ORDER — ACYCLOVIR 5 % EX OINT: TOPICAL_OINTMENT | CUTANEOUS | Status: DC

## 2011-10-24 MED ORDER — AMOXICILLIN-POT CLAVULANATE 875-125 MG PO TABS: 1.0000 | ORAL_TABLET | Freq: Two times a day (BID) | ORAL | Status: DC

## 2011-10-24 MED ORDER — TRAMADOL HCL 50 MG PO TABS: 50.0000 mg | ORAL_TABLET | Freq: Four times a day (QID) | ORAL | Status: DC | PRN

## 2011-10-24 MED ORDER — CYCLOBENZAPRINE HCL 10 MG PO TABS: 10.0000 mg | ORAL_TABLET | Freq: Three times a day (TID) | ORAL | Status: DC | PRN

## 2011-10-24 NOTE — Assessment & Plan Note (Signed)
Flexeril  Ultram rto 2 weeks or sooner if no better

## 2011-10-24 NOTE — Assessment & Plan Note (Signed)
?   Dental issue No swollen lymph nodes No pain with palpation abx sent to pharmacy F/u dentist

## 2011-10-24 NOTE — Telephone Encounter (Signed)
Caller: [name]/Patient; Patient Name: Kimberly Escobar; PCP: Lelon Perla.; Best Callback Phone Number: [phone].  Call regarding Right Jaw Pain, below Ear, onset 2 week. All emergent symptoms ruled out per Jaw Symptoms protocol, see in 72 hours due to persistent pain more than 1 week that interferes with sleep and not evaluated.  Appt scheduled at 1600 on 10-11 with Dr Laury Axon.  Pt verbalized understanding.

## 2011-10-24 NOTE — Progress Notes (Signed)
  Subjective:    Patient ID: Kimberly Escobar, female    DOB: [birth year], 56 y.o.   MRN: [id number]  HPI Pt here c/o R jaw pain.  No clicking, no grinding of teeth.  Pain is not severe in jaw.  No swelling. Pt also c/o low back pain.  Hx pinched nerve and she over did gardening last week and has had pain in low back back and buttock.      Review of Systems as above   Objective:   Physical Exam  Constitutional: She is oriented to person, place, and time. She appears well-developed and well-nourished.  HENT:  Head: Normocephalic.  Right Ear: External ear normal.  Left Ear: External ear normal.  Nose: Nose normal.  Mouth/Throat: Oropharynx is clear and moist. No oropharyngeal exudate.  Neck: Normal range of motion. Neck supple. No thyromegaly present.  Musculoskeletal: She exhibits no edema.       Neg SLR DTR = and B/L   Lymphadenopathy:    She has no cervical adenopathy.  Neurological: She is alert and oriented to person, place, and time.  Psychiatric: She has a normal mood and affect. Her behavior is normal. Judgment and thought content normal.          Assessment & Plan:

## 2011-10-24 NOTE — Patient Instructions (Signed)
Back Pain, Adult Low back pain is very common. About 1 in 5 people have back pain.The cause of low back pain is rarely dangerous. The pain often gets better over time.About half of people with a sudden onset of back pain feel better in just 2 weeks. About 8 in 10 people feel better by 6 weeks.  CAUSES Some common causes of back pain include:  Strain of the muscles or ligaments supporting the spine.  Wear and tear (degeneration) of the spinal discs.  Arthritis.  Direct injury to the back. DIAGNOSIS Most of the time, the direct cause of low back pain is not known.However, back pain can be treated effectively even when the exact cause of the pain is unknown.Answering your caregiver's questions about your overall health and symptoms is one of the most accurate ways to make sure the cause of your pain is not dangerous. If your caregiver needs more information, he or she may order lab work or imaging tests (X-rays or MRIs).However, even if imaging tests show changes in your back, this usually does not require surgery. HOME CARE INSTRUCTIONS For many people, back pain returns.Since low back pain is rarely dangerous, it is often a condition that people can learn to manageon their own.   Remain active. It is stressful on the back to sit or stand in one place. Do not sit, drive, or stand in one place for more than 30 minutes at a time. Take short walks on level surfaces as soon as pain allows.Try to increase the length of time you walk each day.  Do not stay in bed.Resting more than 1 or 2 days can delay your recovery.  Do not avoid exercise or work.Your body is made to move.It is not dangerous to be active, even though your back may hurt.Your back will likely heal faster if you return to being active before your pain is gone.  Pay attention to your body when you bend and lift. Many people have less discomfortwhen lifting if they bend their knees, keep the load close to their bodies,and  avoid twisting. Often, the most comfortable positions are those that put less stress on your recovering back.  Find a comfortable position to sleep. Use a firm mattress and lie on your side with your knees slightly bent. If you lie on your back, put a pillow under your knees.  Only take over-the-counter or prescription medicines as directed by your caregiver. Over-the-counter medicines to reduce pain and inflammation are often the most helpful.Your caregiver may prescribe muscle relaxant drugs.These medicines help dull your pain so you can more quickly return to your normal activities and healthy exercise.  Put ice on the injured area.  Put ice in a plastic bag.  Place a towel between your skin and the bag.  Leave the ice on for 15 to 20 minutes, 3 to 4 times a day for the first 2 to 3 days. After that, ice and heat may be alternated to reduce pain and spasms.  Ask your caregiver about trying back exercises and gentle massage. This may be of some benefit.  Avoid feeling anxious or stressed.Stress increases muscle tension and can worsen back pain.It is important to recognize when you are anxious or stressed and learn ways to manage it.Exercise is a great option. SEEK MEDICAL CARE IF:  You have pain that is not relieved with rest or medicine.  You have pain that does not improve in 1 week.  You have new symptoms.  You are generally   not feeling well. SEEK IMMEDIATE MEDICAL CARE IF:   You have pain that radiates from your back into your legs.  You develop new bowel or bladder control problems.  You have unusual weakness or numbness in your arms or legs.  You develop nausea or vomiting.  You develop abdominal pain.  You feel faint. Document Released: 12/30/2004 Document Revised: 07/01/2011 Document Reviewed: 05/20/2010 [employer] Patient Information 2013 [employer].  

## 2011-10-24 NOTE — Telephone Encounter (Signed)
FYI

## 2011-11-17 ENCOUNTER — Other Ambulatory Visit (HOSPITAL_COMMUNITY)
Admission: RE | Admit: 2011-11-17 | Discharge: 2011-11-17 | Disposition: A | Discharge status: Home / Self Care | Payer: 59 | Source: Ambulatory Visit | Attending: Family Medicine | Admitting: Family Medicine

## 2011-11-17 ENCOUNTER — Ambulatory Visit (INDEPENDENT_AMBULATORY_CARE_PROVIDER_SITE_OTHER): Payer: 59 | Admitting: Family Medicine

## 2011-11-17 ENCOUNTER — Encounter: Payer: Self-pay | Admitting: Family Medicine

## 2011-11-17 VITALS — BP 108/64 | HR 75 | Temp 98.3°F | Ht 65.0 in | Wt 158.0 lb

## 2011-11-17 DIAGNOSIS — M545 Low back pain, unspecified: Secondary | ICD-10-CM

## 2011-11-17 DIAGNOSIS — Z124 Encounter for screening for malignant neoplasm of cervix: Secondary | ICD-10-CM | POA: Insufficient documentation

## 2011-11-17 DIAGNOSIS — Z23 Encounter for immunization: Secondary | ICD-10-CM

## 2011-11-17 DIAGNOSIS — G43909 Migraine, unspecified, not intractable, without status migrainosus: Secondary | ICD-10-CM

## 2011-11-17 DIAGNOSIS — Z0000 Encounter for general adult medical examination without abnormal findings: Secondary | ICD-10-CM

## 2011-11-17 LAB — POCT URINALYSIS DIPSTICK
Bilirubin, UA: NEGATIVE
Blood, UA: NEGATIVE
Nitrite, UA: NEGATIVE
Protein, UA: NEGATIVE
pH, UA: 6.5

## 2011-11-17 LAB — CBC WITH DIFFERENTIAL/PLATELET
Basophils Absolute: 0 10*3/uL (ref 0.0–0.1)
Eosinophils Absolute: 0 10*3/uL (ref 0.0–0.7)
Lymphocytes Relative: 36.3 % (ref 12.0–46.0)
MCHC: 32.9 g/dL (ref 30.0–36.0)
MCV: 97.6 fl (ref 78.0–100.0)
Monocytes Absolute: 0.3 10*3/uL (ref 0.1–1.0)
Neutrophils Relative %: 56.1 % (ref 43.0–77.0)
Platelets: 278 10*3/uL (ref 150.0–400.0)
RBC: 4.28 Mil/uL (ref 3.87–5.11)
RDW: 13.5 % (ref 11.5–14.6)

## 2011-11-17 LAB — BASIC METABOLIC PANEL
BUN: 14 mg/dL (ref 6–23)
CO2: 27 mEq/L (ref 19–32)
Calcium: 9.7 mg/dL (ref 8.4–10.5)
Chloride: 105 mEq/L (ref 96–112)
Creatinine, Ser: 0.7 mg/dL (ref 0.4–1.2)

## 2011-11-17 LAB — HEPATIC FUNCTION PANEL
Alkaline Phosphatase: 68 U/L (ref 39–117)
Bilirubin, Direct: 0 mg/dL (ref 0.0–0.3)
Total Bilirubin: 0.3 mg/dL (ref 0.3–1.2)
Total Protein: 8.1 g/dL (ref 6.0–8.3)

## 2011-11-17 LAB — LIPID PANEL
HDL: 61 mg/dL (ref >39.00)
LDL Cholesterol: 113 mg/dL — ABNORMAL HIGH (ref 0–99)
Total CHOL/HDL Ratio: 3
Triglycerides: 71 mg/dL (ref 0.0–149.0)
VLDL: 14.2 mg/dL (ref 0.0–40.0)

## 2011-11-17 MED ORDER — RIZATRIPTAN BENZOATE 10 MG PO TBDP: 10.0000 mg | ORAL_TABLET | ORAL | Status: DC | PRN

## 2011-11-17 MED ORDER — KETOROLAC TROMETHAMINE 60 MG/2ML IM SOLN
60.0000 mg | Freq: Once | INTRAMUSCULAR | Status: AC
  Administered 2011-11-17: 60 mg via INTRAMUSCULAR

## 2011-11-17 NOTE — Patient Instructions (Addendum)
Preventive Care for Adults, Female A healthy lifestyle and preventive care can promote health and wellness. Preventive health guidelines for women include the following key practices.  A routine yearly physical is a good way to check with your caregiver about your health and preventive screening. It is a chance to share any concerns and updates on your health, and to receive a thorough exam.  Visit your dentist for a routine exam and preventive care every 6 months. Brush your teeth twice a day and floss once a day. Good oral hygiene prevents tooth decay and gum disease.  The frequency of eye exams is based on your age, health, family medical history, use of contact lenses, and other factors. Follow your caregiver's recommendations for frequency of eye exams.  Eat a healthy diet. Foods like vegetables, fruits, whole grains, low-fat dairy products, and lean protein foods contain the nutrients you need without too many calories. Decrease your intake of foods high in solid fats, added sugars, and salt. Eat the right amount of calories for you.Get information about a proper diet from your caregiver, if necessary.  Regular physical exercise is one of the most important things you can do for your health. Most adults should get at least 150 minutes of moderate-intensity exercise (any activity that increases your heart rate and causes you to sweat) each week. In addition, most adults need muscle-strengthening exercises on 2 or more days a week.  Maintain a healthy weight. The body mass index (BMI) is a screening tool to identify possible weight problems. It provides an estimate of body fat based on height and weight. Your caregiver can help determine your BMI, and can help you achieve or maintain a healthy weight.For adults 20 years and older:  A BMI below 18.5 is considered underweight.  A BMI of 18.5 to 24.9 is normal.  A BMI of 25 to 29.9 is considered overweight.  A BMI of 30 and above is  considered obese.  Maintain normal blood lipids and cholesterol levels by exercising and minimizing your intake of saturated fat. Eat a balanced diet with plenty of fruit and vegetables. Blood tests for lipids and cholesterol should begin at age 20 and be repeated every 5 years. If your lipid or cholesterol levels are high, you are over 50, or you are at high risk for heart disease, you may need your cholesterol levels checked more frequently.Ongoing high lipid and cholesterol levels should be treated with medicines if diet and exercise are not effective.  If you smoke, find out from your caregiver how to quit. If you do not use tobacco, do not start.  If you are pregnant, do not drink alcohol. If you are breastfeeding, be very cautious about drinking alcohol. If you are not pregnant and choose to drink alcohol, do not exceed 1 drink per day. One drink is considered to be 12 ounces (355 mL) of beer, 5 ounces (148 mL) of wine, or 1.5 ounces (44 mL) of liquor.  Avoid use of street drugs. Do not share needles with anyone. Ask for help if you need support or instructions about stopping the use of drugs.  High blood pressure causes heart disease and increases the risk of stroke. Your blood pressure should be checked at least every 1 to 2 years. Ongoing high blood pressure should be treated with medicines if weight loss and exercise are not effective.  If you are 55 to 58 years old, ask your caregiver if you should take aspirin to prevent strokes.  Diabetes   screening involves taking a blood sample to check your fasting blood sugar level. This should be done once every 3 years, after age 45, if you are within normal weight and without risk factors for diabetes. Testing should be considered at a younger age or be carried out more frequently if you are overweight and have at least 1 risk factor for diabetes.  Breast cancer screening is essential preventive care for women. You should practice "breast  self-awareness." This means understanding the normal appearance and feel of your breasts and may include breast self-examination. Any changes detected, no matter how small, should be reported to a caregiver. Women in their 20s and 30s should have a clinical breast exam (CBE) by a caregiver as part of a regular health exam every 1 to 3 years. After age 40, women should have a CBE every year. Starting at age 40, women should consider having a mammography (breast X-ray test) every year. Women who have a family history of breast cancer should talk to their caregiver about genetic screening. Women at a high risk of breast cancer should talk to their caregivers about having magnetic resonance imaging (MRI) and a mammography every year.  The Pap test is a screening test for cervical cancer. A Pap test can show cell changes on the cervix that might become cervical cancer if left untreated. A Pap test is a procedure in which cells are obtained and examined from the lower end of the uterus (cervix).  Women should have a Pap test starting at age 21.  Between ages 21 and 29, Pap tests should be repeated every 2 years.  Beginning at age 30, you should have a Pap test every 3 years as long as the past 3 Pap tests have been normal.  Some women have medical problems that increase the chance of getting cervical cancer. Talk to your caregiver about these problems. It is especially important to talk to your caregiver if a new problem develops soon after your last Pap test. In these cases, your caregiver may recommend more frequent screening and Pap tests.  The above recommendations are the same for women who have or have not gotten the vaccine for human papillomavirus (HPV).  If you had a hysterectomy for a problem that was not cancer or a condition that could lead to cancer, then you no longer need Pap tests. Even if you no longer need a Pap test, a regular exam is a good idea to make sure no other problems are  starting.  If you are between ages 65 and 70, and you have had normal Pap tests going back 10 years, you no longer need Pap tests. Even if you no longer need a Pap test, a regular exam is a good idea to make sure no other problems are starting.  If you have had past treatment for cervical cancer or a condition that could lead to cancer, you need Pap tests and screening for cancer for at least 20 years after your treatment.  If Pap tests have been discontinued, risk factors (such as a new sexual partner) need to be reassessed to determine if screening should be resumed.  The HPV test is an additional test that may be used for cervical cancer screening. The HPV test looks for the virus that can cause the cell changes on the cervix. The cells collected during the Pap test can be tested for HPV. The HPV test could be used to screen women aged 30 years and older, and should   be used in women of any age who have unclear Pap test results. After the age of 30, women should have HPV testing at the same frequency as a Pap test.  Colorectal cancer can be detected and often prevented. Most routine colorectal cancer screening begins at the age of 50 and continues through age 75. However, your caregiver may recommend screening at an earlier age if you have risk factors for colon cancer. On a yearly basis, your caregiver may provide home test kits to check for hidden blood in the stool. Use of a small camera at the end of a tube, to directly examine the colon (sigmoidoscopy or colonoscopy), can detect the earliest forms of colorectal cancer. Talk to your caregiver about this at age 50, when routine screening begins. Direct examination of the colon should be repeated every 5 to 10 years through age 75, unless early forms of pre-cancerous polyps or small growths are found.  Hepatitis C blood testing is recommended for all people born from 1945 through 1965 and any individual with known risks for hepatitis C.  Practice  safe sex. Use condoms and avoid high-risk sexual practices to reduce the spread of sexually transmitted infections (STIs). STIs include gonorrhea, chlamydia, syphilis, trichomonas, herpes, HPV, and human immunodeficiency virus (HIV). Herpes, HIV, and HPV are viral illnesses that have no cure. They can result in disability, cancer, and death. Sexually active women aged 25 and younger should be checked for chlamydia. Older women with new or multiple partners should also be tested for chlamydia. Testing for other STIs is recommended if you are sexually active and at increased risk.  Osteoporosis is a disease in which the bones lose minerals and strength with aging. This can result in serious bone fractures. The risk of osteoporosis can be identified using a bone density scan. Women ages 65 and over and women at risk for fractures or osteoporosis should discuss screening with their caregivers. Ask your caregiver whether you should take a calcium supplement or vitamin D to reduce the rate of osteoporosis.  Menopause can be associated with physical symptoms and risks. Hormone replacement therapy is available to decrease symptoms and risks. You should talk to your caregiver about whether hormone replacement therapy is right for you.  Use sunscreen with sun protection factor (SPF) of 30 or more. Apply sunscreen liberally and repeatedly throughout the day. You should seek shade when your shadow is shorter than you. Protect yourself by wearing long sleeves, pants, a wide-brimmed hat, and sunglasses year round, whenever you are outdoors.  Once a month, do a whole body skin exam, using a mirror to look at the skin on your back. Notify your caregiver of new moles, moles that have irregular borders, moles that are larger than a pencil eraser, or moles that have changed in shape or color.  Stay current with required immunizations.  Influenza. You need a dose every fall (or winter). The composition of the flu vaccine  changes each year, so being vaccinated once is not enough.  Pneumococcal polysaccharide. You need 1 to 2 doses if you smoke cigarettes or if you have certain chronic medical conditions. You need 1 dose at age 65 (or older) if you have never been vaccinated.  Tetanus, diphtheria, pertussis (Tdap, Td). Get 1 dose of Tdap vaccine if you are younger than age 65, are over 65 and have contact with an infant, are a healthcare worker, are pregnant, or simply want to be protected from whooping cough. After that, you need a Td   booster dose every 10 years. Consult your caregiver if you have not had at least 3 tetanus and diphtheria-containing shots sometime in your life or have a deep or dirty wound.  HPV. You need this vaccine if you are a woman age 26 or younger. The vaccine is given in 3 doses over 6 months.  Measles, mumps, rubella (MMR). You need at least 1 dose of MMR if you were born in 1957 or later. You may also need a second dose.  Meningococcal. If you are age 19 to 21 and a first-year college student living in a residence hall, or have one of several medical conditions, you need to get vaccinated against meningococcal disease. You may also need additional booster doses.  Zoster (shingles). If you are age 60 or older, you should get this vaccine.  Varicella (chickenpox). If you have never had chickenpox or you were vaccinated but received only 1 dose, talk to your caregiver to find out if you need this vaccine.  Hepatitis A. You need this vaccine if you have a specific risk factor for hepatitis A virus infection or you simply wish to be protected from this disease. The vaccine is usually given as 2 doses, 6 to 18 months apart.  Hepatitis B. You need this vaccine if you have a specific risk factor for hepatitis B virus infection or you simply wish to be protected from this disease. The vaccine is given in 3 doses, usually over 6 months. Preventive Services / Frequency Ages 19 to 39  Blood  pressure check.** / Every 1 to 2 years.  Lipid and cholesterol check.** / Every 5 years beginning at age 20.  Clinical breast exam.** / Every 3 years for women in their 20s and 30s.  Pap test.** / Every 2 years from ages 21 through 29. Every 3 years starting at age 30 through age 65 or 70 with a history of 3 consecutive normal Pap tests.  HPV screening.** / Every 3 years from ages 30 through ages 65 to 70 with a history of 3 consecutive normal Pap tests.  Hepatitis C blood test.** / For any individual with known risks for hepatitis C.  Skin self-exam. / Monthly.  Influenza immunization.** / Every year.  Pneumococcal polysaccharide immunization.** / 1 to 2 doses if you smoke cigarettes or if you have certain chronic medical conditions.  Tetanus, diphtheria, pertussis (Tdap, Td) immunization. / A one-time dose of Tdap vaccine. After that, you need a Td booster dose every 10 years.  HPV immunization. / 3 doses over 6 months, if you are 26 and younger.  Measles, mumps, rubella (MMR) immunization. / You need at least 1 dose of MMR if you were born in 1957 or later. You may also need a second dose.  Meningococcal immunization. / 1 dose if you are age 19 to 21 and a first-year college student living in a residence hall, or have one of several medical conditions, you need to get vaccinated against meningococcal disease. You may also need additional booster doses.  Varicella immunization.** / Consult your caregiver.  Hepatitis A immunization.** / Consult your caregiver. 2 doses, 6 to 18 months apart.  Hepatitis B immunization.** / Consult your caregiver. 3 doses usually over 6 months. Ages 40 to 64  Blood pressure check.** / Every 1 to 2 years.  Lipid and cholesterol check.** / Every 5 years beginning at age 20.  Clinical breast exam.** / Every year after age 40.  Mammogram.** / Every year beginning at age 40   and continuing for as long as you are in good health. Consult with your  caregiver.  Pap test.** / Every 3 years starting at age 30 through age 65 or 70 with a history of 3 consecutive normal Pap tests.  HPV screening.** / Every 3 years from ages 30 through ages 65 to 70 with a history of 3 consecutive normal Pap tests.  Fecal occult blood test (FOBT) of stool. / Every year beginning at age 50 and continuing until age 75. You may not need to do this test if you get a colonoscopy every 10 years.  Flexible sigmoidoscopy or colonoscopy.** / Every 5 years for a flexible sigmoidoscopy or every 10 years for a colonoscopy beginning at age 50 and continuing until age 75.  Hepatitis C blood test.** / For all people born from 1945 through 1965 and any individual with known risks for hepatitis C.  Skin self-exam. / Monthly.  Influenza immunization.** / Every year.  Pneumococcal polysaccharide immunization.** / 1 to 2 doses if you smoke cigarettes or if you have certain chronic medical conditions.  Tetanus, diphtheria, pertussis (Tdap, Td) immunization.** / A one-time dose of Tdap vaccine. After that, you need a Td booster dose every 10 years.  Measles, mumps, rubella (MMR) immunization. / You need at least 1 dose of MMR if you were born in 1957 or later. You may also need a second dose.  Varicella immunization.** / Consult your caregiver.  Meningococcal immunization.** / Consult your caregiver.  Hepatitis A immunization.** / Consult your caregiver. 2 doses, 6 to 18 months apart.  Hepatitis B immunization.** / Consult your caregiver. 3 doses, usually over 6 months. Ages 65 and over  Blood pressure check.** / Every 1 to 2 years.  Lipid and cholesterol check.** / Every 5 years beginning at age 20.  Clinical breast exam.** / Every year after age 40.  Mammogram.** / Every year beginning at age 40 and continuing for as long as you are in good health. Consult with your caregiver.  Pap test.** / Every 3 years starting at age 30 through age 65 or 70 with a 3  consecutive normal Pap tests. Testing can be stopped between 65 and 70 with 3 consecutive normal Pap tests and no abnormal Pap or HPV tests in the past 10 years.  HPV screening.** / Every 3 years from ages 30 through ages 65 or 70 with a history of 3 consecutive normal Pap tests. Testing can be stopped between 65 and 70 with 3 consecutive normal Pap tests and no abnormal Pap or HPV tests in the past 10 years.  Fecal occult blood test (FOBT) of stool. / Every year beginning at age 50 and continuing until age 75. You may not need to do this test if you get a colonoscopy every 10 years.  Flexible sigmoidoscopy or colonoscopy.** / Every 5 years for a flexible sigmoidoscopy or every 10 years for a colonoscopy beginning at age 50 and continuing until age 75.  Hepatitis C blood test.** / For all people born from 1945 through 1965 and any individual with known risks for hepatitis C.  Osteoporosis screening.** / A one-time screening for women ages 65 and over and women at risk for fractures or osteoporosis.  Skin self-exam. / Monthly.  Influenza immunization.** / Every year.  Pneumococcal polysaccharide immunization.** / 1 dose at age 65 (or older) if you have never been vaccinated.  Tetanus, diphtheria, pertussis (Tdap, Td) immunization. / A one-time dose of Tdap vaccine if you are over   65 and have contact with an infant, are a healthcare worker, or simply want to be protected from whooping cough. After that, you need a Td booster dose every 10 years.  Varicella immunization.** / Consult your caregiver.  Meningococcal immunization.** / Consult your caregiver.  Hepatitis A immunization.** / Consult your caregiver. 2 doses, 6 to 18 months apart.  Hepatitis B immunization.** / Check with your caregiver. 3 doses, usually over 6 months. ** Family history and personal history of risk and conditions may change your caregiver's recommendations. Document Released: 02/25/2001 Document Revised: 03/24/2011  Document Reviewed: 05/27/2010 [employer] Patient Information 2013 [employer].  

## 2011-11-17 NOTE — Progress Notes (Signed)
Subjective:     Kimberly Escobar is a 56 y.o. female and is here for a comprehensive physical exam. The patient reports problems - low back pain con't and she is seeing dentist for possible TMJ.  History   Social History  . Marital Status: Married    Spouse Name: N/A    Number of Children: 4  . Years of Education: N/A   Occupational History  . housewife Other   Social History Main Topics  . Smoking status: Never Smoker   . Smokeless tobacco: Never Used  . Alcohol Use: 1.8 oz/week    3 Glasses of wine per week  . Drug Use: No  . Sexually Active: Yes -- Female partner(s)   Other Topics Concern  . Not on file   Social History Narrative   Exercise --walk 3x a week   Health Maintenance  Topic Date Due  . Colonoscopy  09/15/1971  . Tetanus/tdap  09/14/1972  . Influenza Vaccine  09/14/2011  . Mammogram  11/25/2012  . Pap Smear  11/17/2014    The following portions of the patient's history were reviewed and updated as appropriate:  She  has a past medical history of Arthritis; Migraines; Environmental allergies; Back pain; and Carpal tunnel syndrome of right wrist. She  does not have any pertinent problems on file. She  has past surgical history that includes Cesarean section. Her family history includes Arthritis in an unspecified family member; Breast cancer in her sister; Heart disease in her mother and sister; Hypertension in her mother and sister; and Stroke in her sister.  There is no history of Colon cancer. She  reports that she has never smoked. She has never used smokeless tobacco. She reports that she drinks about 1.8 ounces of alcohol per week. She reports that she does not use illicit drugs. She has a current medication list which includes the following prescription(s): acyclovir ointment, calcium carbonate-vitamin d, multivitamin, pyridoxine hcl, tramadol, valacyclovir, naproxen-esomeprazole, omeprazole, rizatriptan, and zolpidem. Current Outpatient Prescriptions on File  Prior to Visit  Medication Sig Dispense Refill  . acyclovir ointment (ZOVIRAX) 5 % Apply topically every 3 (three) hours. As directed  15 g  5  . Calcium Carbonate-Vitamin D (CALCIUM + D PO) Take 4 tablets by mouth daily.        . Multiple Vitamin (MULTIVITAMIN) capsule Take 1 capsule by mouth daily.        . Pyridoxine HCl (VITAMIN B6 PO) Take 2 tablets by mouth daily.        . traMADol (ULTRAM) 50 MG tablet Take 1 tablet (50 mg total) by mouth every 6 (six) hours as needed. Maximum dose= 8 tablets per day  30 tablet  1  . valACYclovir (VALTREX) 500 MG tablet Take 1 tablet (500 mg total) by mouth daily.  30 tablet  5  . Naproxen-Esomeprazole (VIMOVO) 500-20 MG TBEC Take 1 tablet by mouth 2 (two) times daily.  60 tablet  5  . omeprazole (PRILOSEC) 20 MG capsule       . rizatriptan (MAXALT-MLT) 10 MG disintegrating tablet Take 1 tablet (10 mg total) by mouth as needed for migraine. May repeat in 2 hours if needed  10 tablet  0  . zolpidem (AMBIEN) 5 MG tablet Take 1 tablet (5 mg total) by mouth at bedtime as needed for sleep.  15 tablet  0   She  has no known allergies..  Review of Systems Review of Systems  Constitutional: Negative for activity change, appetite change and fatigue.  HENT: Negative for hearing loss, congestion, tinnitus and ear discharge.  dentist q70m Eyes: Negative for visual disturbance (see optho q1y -- vision corrected to 20/20 with glasses).  Respiratory: Negative for cough, chest tightness and shortness of breath.   Cardiovascular: Negative for chest pain, palpitations and leg swelling.  Gastrointestinal: Negative for abdominal pain, diarrhea, constipation and abdominal distention.  Genitourinary: Negative for urgency, frequency, decreased urine volume and difficulty urinating.  Musculoskeletal: Negative for arthralgias and gait problem.  + low back pain Skin: Negative for color change, pallor and rash.  Neurological: Negative for dizziness, light-headedness, numbness  --+ headache Hematological: Negative for adenopathy. Does not bruise/bleed easily.  Psychiatric/Behavioral: Negative for suicidal ideas, confusion, sleep disturbance, self-injury, dysphoric mood, decreased concentration and agitation.       Objective:    BP 108/64  Pulse 75  Temp 98.3 F (36.8 C) (Oral)  Ht 5\' 5"  (1.651 m)  Wt 158 lb (71.668 kg)  BMI 26.29 kg/m2  SpO2 96% General appearance: alert, cooperative, appears stated age and no distress Head: Normocephalic, without obvious abnormality, atraumatic Eyes: negative findings: lids and lashes normal, conjunctivae and sclerae normal and pupils equal, round, reactive to light and accomodation Ears: normal TM's and external ear canals both ears Nose: Nares normal. Septum midline. Mucosa normal. No drainage or sinus tenderness. Throat: lips, mucosa, and tongue normal; teeth and gums normal Neck: no adenopathy, no carotid bruit, no JVD, supple, symmetrical, trachea midline and thyroid not enlarged, symmetric, no tenderness/mass/nodules Back: symmetric, no curvature. ROM normal. No CVA tenderness. Lungs: clear to auscultation bilaterally Breasts: normal appearance, no masses or tenderness Heart: S1, S2 normal Abdomen: soft, non-tender; bowel sounds normal; no masses,  no organomegaly Pelvic: cervix normal in appearance, external genitalia normal, no adnexal masses or tenderness, no cervical motion tenderness, rectovaginal septum normal, uterus normal size, shape, and consistency and vagina normal without discharge Extremities: extremities normal, atraumatic, no cyanosis or edema Pulses: 2+ and symmetric Skin: Skin color, texture, turgor normal. No rashes or lesions Lymph nodes: Cervical, supraclavicular, and axillary nodes normal. Neurologic: Alert and oriented X 3, normal strength and tone. Normal symmetric reflexes. Normal coordination and gait psych-- no depression, no anxiety    Assessment:    Healthy female exam.        Plan:  ghm utd Check labs   See After Visit Summary for Counseling Recommendations

## 2011-11-18 ENCOUNTER — Encounter: Payer: Self-pay | Admitting: Gastroenterology

## 2011-11-24 ENCOUNTER — Encounter: Payer: Self-pay | Admitting: Gastroenterology

## 2011-12-22 ENCOUNTER — Telehealth: Payer: Self-pay | Admitting: *Deleted

## 2011-12-22 NOTE — Telephone Encounter (Signed)
[name] came in for a previsit for colon.  She had a colonoscopy at [facility]/ Dr. Allena Katz in late 2011.  She and her husband states that she had a normal colon no polyps.  I am giving the release for medical records form to Graciella Freer.  Wyona Almas

## 2012-01-05 ENCOUNTER — Encounter: Payer: 59 | Admitting: Gastroenterology

## 2012-01-09 ENCOUNTER — Other Ambulatory Visit: Payer: 59 | Admitting: Gastroenterology

## 2012-01-26 ENCOUNTER — Encounter: Payer: Self-pay | Admitting: Family Medicine

## 2012-01-28 ENCOUNTER — Other Ambulatory Visit: Payer: Self-pay | Admitting: *Deleted

## 2012-01-28 ENCOUNTER — Encounter: Payer: 59 | Admitting: Family Medicine

## 2012-01-28 DIAGNOSIS — G4700 Insomnia, unspecified: Secondary | ICD-10-CM

## 2012-01-28 MED ORDER — ZOLPIDEM TARTRATE 5 MG PO TABS: 5.0000 mg | ORAL_TABLET | Freq: Every evening | ORAL | Status: DC | PRN

## 2012-01-28 NOTE — Telephone Encounter (Signed)
Last OV 11-17-11, last filled 01-21-11 #15

## 2012-01-28 NOTE — Telephone Encounter (Signed)
She just got it filled----15 should last 1 month

## 2012-01-28 NOTE — Telephone Encounter (Signed)
Rx was not received by the pharmacy--- Rx phoned in.  While I was waiting for the pharmacist to come on the line I noticed the Rx was not filled since Jan of 2013. Rx has not been phoned in.....Marland KitchenMarland KitchenPlease advise    KP

## 2012-01-28 NOTE — Telephone Encounter (Signed)
i misread date---- ok to fill #[id number]

## 2012-01-29 ENCOUNTER — Other Ambulatory Visit: Payer: Self-pay | Admitting: Family Medicine

## 2012-01-29 MED ORDER — ZOLPIDEM TARTRATE 5 MG PO TABS: 5.0000 mg | ORAL_TABLET | Freq: Every evening | ORAL | Status: DC | PRN

## 2012-03-30 ENCOUNTER — Encounter: Payer: Self-pay | Admitting: Lab

## 2012-03-31 ENCOUNTER — Ambulatory Visit (INDEPENDENT_AMBULATORY_CARE_PROVIDER_SITE_OTHER): Payer: Medicare Other | Admitting: Family Medicine

## 2012-03-31 ENCOUNTER — Encounter: Payer: Self-pay | Admitting: Family Medicine

## 2012-03-31 VITALS — BP 100/76 | HR 90 | Temp 98.7°F | Ht 64.5 in | Wt 164.0 lb

## 2012-03-31 DIAGNOSIS — K219 Gastro-esophageal reflux disease without esophagitis: Secondary | ICD-10-CM

## 2012-03-31 DIAGNOSIS — Z0000 Encounter for general adult medical examination without abnormal findings: Secondary | ICD-10-CM

## 2012-03-31 DIAGNOSIS — G4700 Insomnia, unspecified: Secondary | ICD-10-CM

## 2012-03-31 DIAGNOSIS — Z136 Encounter for screening for cardiovascular disorders: Secondary | ICD-10-CM

## 2012-03-31 MED ORDER — ZOLPIDEM TARTRATE 5 MG PO TABS: 5.0000 mg | ORAL_TABLET | Freq: Every evening | ORAL | Status: DC | PRN

## 2012-03-31 NOTE — Patient Instructions (Addendum)
Preventive Care for Adults, Female A healthy lifestyle and preventive care can promote health and wellness. Preventive health guidelines for women include the following key practices.  A routine yearly physical is a good way to check with your caregiver about your health and preventive screening. It is a chance to share any concerns and updates on your health, and to receive a thorough exam.  Visit your dentist for a routine exam and preventive care every 6 months. Brush your teeth twice a day and floss once a day. Good oral hygiene prevents tooth decay and gum disease.  The frequency of eye exams is based on your age, health, family medical history, use of contact lenses, and other factors. Follow your caregiver's recommendations for frequency of eye exams.  Eat a healthy diet. Foods like vegetables, fruits, whole grains, low-fat dairy products, and lean protein foods contain the nutrients you need without too many calories. Decrease your intake of foods high in solid fats, added sugars, and salt. Eat the right amount of calories for you.Get information about a proper diet from your caregiver, if necessary.  Regular physical exercise is one of the most important things you can do for your health. Most adults should get at least 150 minutes of moderate-intensity exercise (any activity that increases your heart rate and causes you to sweat) each week. In addition, most adults need muscle-strengthening exercises on 2 or more days a week.  Maintain a healthy weight. The body mass index (BMI) is a screening tool to identify possible weight problems. It provides an estimate of body fat based on height and weight. Your caregiver can help determine your BMI, and can help you achieve or maintain a healthy weight.For adults 20 years and older:  A BMI below 18.5 is considered underweight.  A BMI of 18.5 to 24.9 is normal.  A BMI of 25 to 29.9 is considered overweight.  A BMI of 30 and above is  considered obese.  Maintain normal blood lipids and cholesterol levels by exercising and minimizing your intake of saturated fat. Eat a balanced diet with plenty of fruit and vegetables. Blood tests for lipids and cholesterol should begin at age 20 and be repeated every 5 years. If your lipid or cholesterol levels are high, you are over 50, or you are at high risk for heart disease, you may need your cholesterol levels checked more frequently.Ongoing high lipid and cholesterol levels should be treated with medicines if diet and exercise are not effective.  If you smoke, find out from your caregiver how to quit. If you do not use tobacco, do not start.  If you are pregnant, do not drink alcohol. If you are breastfeeding, be very cautious about drinking alcohol. If you are not pregnant and choose to drink alcohol, do not exceed 1 drink per day. One drink is considered to be 12 ounces (355 mL) of beer, 5 ounces (148 mL) of wine, or 1.5 ounces (44 mL) of liquor.  Avoid use of street drugs. Do not share needles with anyone. Ask for help if you need support or instructions about stopping the use of drugs.  High blood pressure causes heart disease and increases the risk of stroke. Your blood pressure should be checked at least every 1 to 2 years. Ongoing high blood pressure should be treated with medicines if weight loss and exercise are not effective.  If you are 55 to 59 years old, ask your caregiver if you should take aspirin to prevent strokes.  Diabetes   screening involves taking a blood sample to check your fasting blood sugar level. This should be done once every 3 years, after age 45, if you are within normal weight and without risk factors for diabetes. Testing should be considered at a younger age or be carried out more frequently if you are overweight and have at least 1 risk factor for diabetes.  Breast cancer screening is essential preventive care for women. You should practice "breast  self-awareness." This means understanding the normal appearance and feel of your breasts and may include breast self-examination. Any changes detected, no matter how small, should be reported to a caregiver. Women in their 20s and 30s should have a clinical breast exam (CBE) by a caregiver as part of a regular health exam every 1 to 3 years. After age 40, women should have a CBE every year. Starting at age 40, women should consider having a mammography (breast X-ray test) every year. Women who have a family history of breast cancer should talk to their caregiver about genetic screening. Women at a high risk of breast cancer should talk to their caregivers about having magnetic resonance imaging (MRI) and a mammography every year.  The Pap test is a screening test for cervical cancer. A Pap test can show cell changes on the cervix that might become cervical cancer if left untreated. A Pap test is a procedure in which cells are obtained and examined from the lower end of the uterus (cervix).  Women should have a Pap test starting at age 21.  Between ages 21 and 29, Pap tests should be repeated every 2 years.  Beginning at age 30, you should have a Pap test every 3 years as long as the past 3 Pap tests have been normal.  Some women have medical problems that increase the chance of getting cervical cancer. Talk to your caregiver about these problems. It is especially important to talk to your caregiver if a new problem develops soon after your last Pap test. In these cases, your caregiver may recommend more frequent screening and Pap tests.  The above recommendations are the same for women who have or have not gotten the vaccine for human papillomavirus (HPV).  If you had a hysterectomy for a problem that was not cancer or a condition that could lead to cancer, then you no longer need Pap tests. Even if you no longer need a Pap test, a regular exam is a good idea to make sure no other problems are  starting.  If you are between ages 65 and 70, and you have had normal Pap tests going back 10 years, you no longer need Pap tests. Even if you no longer need a Pap test, a regular exam is a good idea to make sure no other problems are starting.  If you have had past treatment for cervical cancer or a condition that could lead to cancer, you need Pap tests and screening for cancer for at least 20 years after your treatment.  If Pap tests have been discontinued, risk factors (such as a new sexual partner) need to be reassessed to determine if screening should be resumed.  The HPV test is an additional test that may be used for cervical cancer screening. The HPV test looks for the virus that can cause the cell changes on the cervix. The cells collected during the Pap test can be tested for HPV. The HPV test could be used to screen women aged 30 years and older, and should   be used in women of any age who have unclear Pap test results. After the age of 30, women should have HPV testing at the same frequency as a Pap test.  Colorectal cancer can be detected and often prevented. Most routine colorectal cancer screening begins at the age of 50 and continues through age 75. However, your caregiver may recommend screening at an earlier age if you have risk factors for colon cancer. On a yearly basis, your caregiver may provide home test kits to check for hidden blood in the stool. Use of a small camera at the end of a tube, to directly examine the colon (sigmoidoscopy or colonoscopy), can detect the earliest forms of colorectal cancer. Talk to your caregiver about this at age 50, when routine screening begins. Direct examination of the colon should be repeated every 5 to 10 years through age 75, unless early forms of pre-cancerous polyps or small growths are found.  Hepatitis C blood testing is recommended for all people born from 1945 through 1965 and any individual with known risks for hepatitis C.  Practice  safe sex. Use condoms and avoid high-risk sexual practices to reduce the spread of sexually transmitted infections (STIs). STIs include gonorrhea, chlamydia, syphilis, trichomonas, herpes, HPV, and human immunodeficiency virus (HIV). Herpes, HIV, and HPV are viral illnesses that have no cure. They can result in disability, cancer, and death. Sexually active women aged 25 and younger should be checked for chlamydia. Older women with new or multiple partners should also be tested for chlamydia. Testing for other STIs is recommended if you are sexually active and at increased risk.  Osteoporosis is a disease in which the bones lose minerals and strength with aging. This can result in serious bone fractures. The risk of osteoporosis can be identified using a bone density scan. Women ages 65 and over and women at risk for fractures or osteoporosis should discuss screening with their caregivers. Ask your caregiver whether you should take a calcium supplement or vitamin D to reduce the rate of osteoporosis.  Menopause can be associated with physical symptoms and risks. Hormone replacement therapy is available to decrease symptoms and risks. You should talk to your caregiver about whether hormone replacement therapy is right for you.  Use sunscreen with sun protection factor (SPF) of 30 or more. Apply sunscreen liberally and repeatedly throughout the day. You should seek shade when your shadow is shorter than you. Protect yourself by wearing long sleeves, pants, a wide-brimmed hat, and sunglasses year round, whenever you are outdoors.  Once a month, do a whole body skin exam, using a mirror to look at the skin on your back. Notify your caregiver of new moles, moles that have irregular borders, moles that are larger than a pencil eraser, or moles that have changed in shape or color.  Stay current with required immunizations.  Influenza. You need a dose every fall (or winter). The composition of the flu vaccine  changes each year, so being vaccinated once is not enough.  Pneumococcal polysaccharide. You need 1 to 2 doses if you smoke cigarettes or if you have certain chronic medical conditions. You need 1 dose at age 65 (or older) if you have never been vaccinated.  Tetanus, diphtheria, pertussis (Tdap, Td). Get 1 dose of Tdap vaccine if you are younger than age 65, are over 65 and have contact with an infant, are a healthcare worker, are pregnant, or simply want to be protected from whooping cough. After that, you need a Td   booster dose every 10 years. Consult your caregiver if you have not had at least 3 tetanus and diphtheria-containing shots sometime in your life or have a deep or dirty wound.  HPV. You need this vaccine if you are a woman age 26 or younger. The vaccine is given in 3 doses over 6 months.  Measles, mumps, rubella (MMR). You need at least 1 dose of MMR if you were born in 1957 or later. You may also need a second dose.  Meningococcal. If you are age 19 to 21 and a first-year college student living in a residence hall, or have one of several medical conditions, you need to get vaccinated against meningococcal disease. You may also need additional booster doses.  Zoster (shingles). If you are age 60 or older, you should get this vaccine.  Varicella (chickenpox). If you have never had chickenpox or you were vaccinated but received only 1 dose, talk to your caregiver to find out if you need this vaccine.  Hepatitis A. You need this vaccine if you have a specific risk factor for hepatitis A virus infection or you simply wish to be protected from this disease. The vaccine is usually given as 2 doses, 6 to 18 months apart.  Hepatitis B. You need this vaccine if you have a specific risk factor for hepatitis B virus infection or you simply wish to be protected from this disease. The vaccine is given in 3 doses, usually over 6 months. Preventive Services / Frequency Ages 19 to 39  Blood  pressure check.** / Every 1 to 2 years.  Lipid and cholesterol check.** / Every 5 years beginning at age 20.  Clinical breast exam.** / Every 3 years for women in their 20s and 30s.  Pap test.** / Every 2 years from ages 21 through 29. Every 3 years starting at age 30 through age 65 or 70 with a history of 3 consecutive normal Pap tests.  HPV screening.** / Every 3 years from ages 30 through ages 65 to 70 with a history of 3 consecutive normal Pap tests.  Hepatitis C blood test.** / For any individual with known risks for hepatitis C.  Skin self-exam. / Monthly.  Influenza immunization.** / Every year.  Pneumococcal polysaccharide immunization.** / 1 to 2 doses if you smoke cigarettes or if you have certain chronic medical conditions.  Tetanus, diphtheria, pertussis (Tdap, Td) immunization. / A one-time dose of Tdap vaccine. After that, you need a Td booster dose every 10 years.  HPV immunization. / 3 doses over 6 months, if you are 26 and younger.  Measles, mumps, rubella (MMR) immunization. / You need at least 1 dose of MMR if you were born in 1957 or later. You may also need a second dose.  Meningococcal immunization. / 1 dose if you are age 19 to 21 and a first-year college student living in a residence hall, or have one of several medical conditions, you need to get vaccinated against meningococcal disease. You may also need additional booster doses.  Varicella immunization.** / Consult your caregiver.  Hepatitis A immunization.** / Consult your caregiver. 2 doses, 6 to 18 months apart.  Hepatitis B immunization.** / Consult your caregiver. 3 doses usually over 6 months. Ages 40 to 64  Blood pressure check.** / Every 1 to 2 years.  Lipid and cholesterol check.** / Every 5 years beginning at age 20.  Clinical breast exam.** / Every year after age 40.  Mammogram.** / Every year beginning at age 40   and continuing for as long as you are in good health. Consult with your  caregiver.  Pap test.** / Every 3 years starting at age 30 through age 65 or 70 with a history of 3 consecutive normal Pap tests.  HPV screening.** / Every 3 years from ages 30 through ages 65 to 70 with a history of 3 consecutive normal Pap tests.  Fecal occult blood test (FOBT) of stool. / Every year beginning at age 50 and continuing until age 75. You may not need to do this test if you get a colonoscopy every 10 years.  Flexible sigmoidoscopy or colonoscopy.** / Every 5 years for a flexible sigmoidoscopy or every 10 years for a colonoscopy beginning at age 50 and continuing until age 75.  Hepatitis C blood test.** / For all people born from 1945 through 1965 and any individual with known risks for hepatitis C.  Skin self-exam. / Monthly.  Influenza immunization.** / Every year.  Pneumococcal polysaccharide immunization.** / 1 to 2 doses if you smoke cigarettes or if you have certain chronic medical conditions.  Tetanus, diphtheria, pertussis (Tdap, Td) immunization.** / A one-time dose of Tdap vaccine. After that, you need a Td booster dose every 10 years.  Measles, mumps, rubella (MMR) immunization. / You need at least 1 dose of MMR if you were born in 1957 or later. You may also need a second dose.  Varicella immunization.** / Consult your caregiver.  Meningococcal immunization.** / Consult your caregiver.  Hepatitis A immunization.** / Consult your caregiver. 2 doses, 6 to 18 months apart.  Hepatitis B immunization.** / Consult your caregiver. 3 doses, usually over 6 months. Ages 65 and over  Blood pressure check.** / Every 1 to 2 years.  Lipid and cholesterol check.** / Every 5 years beginning at age 20.  Clinical breast exam.** / Every year after age 40.  Mammogram.** / Every year beginning at age 40 and continuing for as long as you are in good health. Consult with your caregiver.  Pap test.** / Every 3 years starting at age 30 through age 65 or 70 with a 3  consecutive normal Pap tests. Testing can be stopped between 65 and 70 with 3 consecutive normal Pap tests and no abnormal Pap or HPV tests in the past 10 years.  HPV screening.** / Every 3 years from ages 30 through ages 65 or 70 with a history of 3 consecutive normal Pap tests. Testing can be stopped between 65 and 70 with 3 consecutive normal Pap tests and no abnormal Pap or HPV tests in the past 10 years.  Fecal occult blood test (FOBT) of stool. / Every year beginning at age 50 and continuing until age 75. You may not need to do this test if you get a colonoscopy every 10 years.  Flexible sigmoidoscopy or colonoscopy.** / Every 5 years for a flexible sigmoidoscopy or every 10 years for a colonoscopy beginning at age 50 and continuing until age 75.  Hepatitis C blood test.** / For all people born from 1945 through 1965 and any individual with known risks for hepatitis C.  Osteoporosis screening.** / A one-time screening for women ages 65 and over and women at risk for fractures or osteoporosis.  Skin self-exam. / Monthly.  Influenza immunization.** / Every year.  Pneumococcal polysaccharide immunization.** / 1 dose at age 65 (or older) if you have never been vaccinated.  Tetanus, diphtheria, pertussis (Tdap, Td) immunization. / A one-time dose of Tdap vaccine if you are over   65 and have contact with an infant, are a healthcare worker, or simply want to be protected from whooping cough. After that, you need a Td booster dose every 10 years.  Varicella immunization.** / Consult your caregiver.  Meningococcal immunization.** / Consult your caregiver.  Hepatitis A immunization.** / Consult your caregiver. 2 doses, 6 to 18 months apart.  Hepatitis B immunization.** / Check with your caregiver. 3 doses, usually over 6 months. ** Family history and personal history of risk and conditions may change your caregiver's recommendations. Document Released: 02/25/2001 Document Revised: 03/24/2011  Document Reviewed: 05/27/2010 [employer] Patient Information 2013 [employer]  

## 2012-03-31 NOTE — Progress Notes (Signed)
Subjective:    Kimberly Escobar is a 57 y.o. female who presents for Medicare Annual/Subsequent preventive examination.  Preventive Screening-Counseling & Management  Tobacco History  Smoking status  . Never Smoker   Smokeless tobacco  . Never Used     Problems Prior to Visit 1. Uri symptoms  Current Problems (verified) Patient Active Problem List  Diagnosis  . CTS (carpal tunnel syndrome)  . GERD (gastroesophageal reflux disease)  . Insomnia  . Jaw pain  . Back pain    Medications Prior to Visit Current Outpatient Prescriptions on File Prior to Visit  Medication Sig Dispense Refill  . acyclovir ointment (ZOVIRAX) 5 % Apply topically every 3 (three) hours. As directed  15 g  5  . Calcium Carbonate-Vitamin D (CALCIUM + D PO) Take 4 tablets by mouth daily.        . Multiple Vitamin (MULTIVITAMIN) capsule Take 1 capsule by mouth daily.        . Naproxen-Esomeprazole (VIMOVO) 500-20 MG TBEC Take 1 tablet by mouth 2 (two) times daily.  60 tablet  5  . omeprazole (PRILOSEC) 20 MG capsule       . Pyridoxine HCl (VITAMIN B6 PO) Take 2 tablets by mouth daily.        . rizatriptan (MAXALT-MLT) 10 MG disintegrating tablet Take 1 tablet (10 mg total) by mouth as needed for migraine. May repeat in 2 hours if needed  10 tablet  0  . traMADol (ULTRAM) 50 MG tablet Take 1 tablet (50 mg total) by mouth every 6 (six) hours as needed. Maximum dose= 8 tablets per day  30 tablet  1  . valACYclovir (VALTREX) 500 MG tablet Take 1 tablet (500 mg total) by mouth daily.  30 tablet  5   No current facility-administered medications on file prior to visit.    Current Medications (verified) Current Outpatient Prescriptions  Medication Sig Dispense Refill  . acyclovir ointment (ZOVIRAX) 5 % Apply topically every 3 (three) hours. As directed  15 g  5  . Calcium Carbonate-Vitamin D (CALCIUM + D PO) Take 4 tablets by mouth daily.        . Multiple Vitamin (MULTIVITAMIN) capsule Take 1 capsule by  mouth daily.        . Naproxen-Esomeprazole (VIMOVO) 500-20 MG TBEC Take 1 tablet by mouth 2 (two) times daily.  60 tablet  5  . omeprazole (PRILOSEC) 20 MG capsule       . Pyridoxine HCl (VITAMIN B6 PO) Take 2 tablets by mouth daily.        . rizatriptan (MAXALT-MLT) 10 MG disintegrating tablet Take 1 tablet (10 mg total) by mouth as needed for migraine. May repeat in 2 hours if needed  10 tablet  0  . traMADol (ULTRAM) 50 MG tablet Take 1 tablet (50 mg total) by mouth every 6 (six) hours as needed. Maximum dose= 8 tablets per day  30 tablet  1  . valACYclovir (VALTREX) 500 MG tablet Take 1 tablet (500 mg total) by mouth daily.  30 tablet  5  . zolpidem (AMBIEN) 5 MG tablet Take 1 tablet (5 mg total) by mouth at bedtime as needed for sleep.  15 tablet  0   No current facility-administered medications for this visit.     Allergies (verified) Review of patient's allergies indicates no known allergies.   PAST HISTORY  Family History Family History  Problem Relation Age of Onset  . Arthritis    . Heart disease Mother   .  Hypertension Mother   . Heart disease Sister     Pacemaker  . Hypertension Sister   . Stroke Sister     oldest sibling  . Breast cancer Sister     Oldest sibling  . Colon cancer Neg Hx     Social History History  Substance Use Topics  . Smoking status: Never Smoker   . Smokeless tobacco: Never Used  . Alcohol Use: 1.8 oz/week    3 Glasses of wine per week     Are there smokers in your home (other than you)? No  Risk Factors Current exercise habits: Gym/ health club routine includes low impact aerobics.  Dietary issues discussed: na   Cardiac risk factors: none.  Depression Screen (Note: if answer to either of the following is "Yes", a more complete depression screening is indicated)   Over the past two weeks, have you felt down, depressed or hopeless? No  Over the past two weeks, have you felt little interest or pleasure in doing things? No  Have  you lost interest or pleasure in daily life? No  Do you often feel hopeless? No  Do you cry easily over simple problems? No  Activities of Daily Living In your present state of health, do you have any difficulty performing the following activities?:  Driving? No Managing money?  No Feeding yourself? No Getting from bed to chair? No Climbing a flight of stairs? No Preparing food and eating?: No Bathing or showering? No Getting dressed: No Getting to the toilet? No Using the toilet:No Moving around from place to place: No In the past year have you fallen or had a near fall?:No   Are you sexually active?  Yes  Do you have more than one partner?  No  Hearing Difficulties: No Do you often ask people to speak up or repeat themselves? No Do you experience ringing or noises in your ears? No Do you have difficulty understanding soft or whispered voices? No   Do you feel that you have a problem with memory? Yes  Do you often misplace items? No  Do you feel safe at home?  Yes  Cognitive Testing  Alert? Yes  Normal Appearance?Yes  Oriented to person? Yes  Place? Yes   Time? Yes  Recall of three objects?  Yes  Can perform simple calculations? Yes  Displays appropriate judgment?Yes   Can read the correct time from a watch face?Yes   Advanced Directives have been discussed with the patient? Yes  List the Names of Other Physician/Practitioners you currently use: 1.  opth- ? 2 ortho--paul 3 dentist-- bray Indicate any recent Medical Services you may have received from other than [facility] providers in the past year (date may be approximate).  Immunization History  Administered Date(s) Administered  . Tdap 11/17/2011    Screening Tests Health Maintenance  Topic Date Due  . Influenza Vaccine  Dec 31, 1955  . Colonoscopy  09/15/1971  . Mammogram  12/03/2013  . Pap Smear  11/17/2014  . Tetanus/tdap  11/16/2021    All answers were reviewed with the patient and necessary referrals  were made:  Loreen Freud, DO   03/31/2012   History reviewed:  She  has a past medical history of Arthritis; Migraines; Environmental allergies; Back pain; and Carpal tunnel syndrome of right wrist. She  does not have any pertinent problems on file. She  has past surgical history that includes Cesarean section. Her family history includes Arthritis in an unspecified family member; Breast cancer in her  sister; Heart disease in her mother and sister; Hypertension in her mother and sister; and Stroke in her sister.  There is no history of Colon cancer. She  reports that she has never smoked. She has never used smokeless tobacco. She reports that she drinks about 1.8 ounces of alcohol per week. She reports that she does not use illicit drugs. She has a current medication list which includes the following prescription(s): acyclovir ointment, calcium carbonate-vitamin d, multivitamin, naproxen-esomeprazole, omeprazole, pyridoxine hcl, rizatriptan, tramadol, valacyclovir, and zolpidem. Current Outpatient Prescriptions on File Prior to Visit  Medication Sig Dispense Refill  . acyclovir ointment (ZOVIRAX) 5 % Apply topically every 3 (three) hours. As directed  15 g  5  . Calcium Carbonate-Vitamin D (CALCIUM + D PO) Take 4 tablets by mouth daily.        . Multiple Vitamin (MULTIVITAMIN) capsule Take 1 capsule by mouth daily.        . Naproxen-Esomeprazole (VIMOVO) 500-20 MG TBEC Take 1 tablet by mouth 2 (two) times daily.  60 tablet  5  . omeprazole (PRILOSEC) 20 MG capsule       . Pyridoxine HCl (VITAMIN B6 PO) Take 2 tablets by mouth daily.        . rizatriptan (MAXALT-MLT) 10 MG disintegrating tablet Take 1 tablet (10 mg total) by mouth as needed for migraine. May repeat in 2 hours if needed  10 tablet  0  . traMADol (ULTRAM) 50 MG tablet Take 1 tablet (50 mg total) by mouth every 6 (six) hours as needed. Maximum dose= 8 tablets per day  30 tablet  1  . valACYclovir (VALTREX) 500 MG tablet Take 1 tablet  (500 mg total) by mouth daily.  30 tablet  5   No current facility-administered medications on file prior to visit.   She has No Known Allergies.  Review of Systems Review of Systems  Constitutional: Negative for activity change, appetite change and fatigue.  HENT: Negative for hearing loss, congestion, tinnitus and ear discharge.  dentist q46m Eyes: Negative for visual disturbance (see optho q1y -- vision corrected to 20/20 with glasses).  Respiratory: Negative for cough, chest tightness and shortness of breath.   Cardiovascular: Negative for chest pain, palpitations and leg swelling.  Gastrointestinal: Negative for abdominal pain, diarrhea, constipation and abdominal distention.  Genitourinary: Negative for urgency, frequency, decreased urine volume and difficulty urinating.  Musculoskeletal: Negative for back pain, arthralgias and gait problem.  Skin: Negative for color change, pallor and rash.  Neurological: Negative for dizziness, light-headedness, numbness and headaches.  Hematological: Negative for adenopathy. Does not bruise/bleed easily.  Psychiatric/Behavioral: Negative for suicidal ideas, confusion, sleep disturbance, self-injury, dysphoric mood, decreased concentration and agitation.       Objective:     Vision by Snellen chart: opht0  Body mass index is 27.73 kg/(m^2). BP 100/76  Pulse 90  Temp(Src) 98.7 F (37.1 C)  Ht 5' 4.5" (1.638 m)  Wt 164 lb (74.39 kg)  BMI 27.73 kg/m2  SpO2 97%  BP 100/76  Pulse 90  Temp(Src) 98.7 F (37.1 C)  Ht 5' 4.5" (1.638 m)  Wt 164 lb (74.39 kg)  BMI 27.73 kg/m2  SpO2 97% General appearance: alert, cooperative, appears stated age and no distress Head: Normocephalic, without obvious abnormality, atraumatic Eyes: conjunctivae/corneas clear. PERRL, EOM's intact. Fundi benign. Ears: normal TM's and external ear canals both ears Nose: Nares normal. Septum midline. Mucosa normal. No drainage or sinus tenderness. Throat: lips,  mucosa, and tongue normal; teeth and gums normal  Neck: no adenopathy, no carotid bruit, no JVD, supple, symmetrical, trachea midline and thyroid not enlarged, symmetric, no tenderness/mass/nodules Back: symmetric, no curvature. ROM normal. No CVA tenderness. Lungs: clear to auscultation bilaterally Breasts: normal appearance, no masses or tenderness Heart: regular rate and rhythm, S1, S2 normal, no murmur, click, rub or gallop Abdomen: soft, non-tender; bowel sounds normal; no masses,  no organomegaly Pelvic: deferred Extremities: extremities normal, atraumatic, no cyanosis or edema Pulses: 2+ and symmetric Skin: Skin color, texture, turgor normal. No rashes or lesions Lymph nodes: Cervical, supraclavicular, and axillary nodes normal. Neurologic: Alert and oriented X 3, normal strength and tone. Normal symmetric reflexes. Normal coordination and gait Psych--no depression, no anxiety     Assessment:     cpe      Plan:     During the course of the visit the patient was educated and counseled about appropriate screening and preventive services including:    Screening electrocardiogram  Screening mammography  Bone densitometry screening  Colorectal cancer screening  Advanced directives: has NO advanced directive - not interested in additional information  Diet review for nutrition referral? Yes ____  Not Indicated ____   Patient Instructions (the written plan) was given to the patient.  Medicare Attestation I have personally reviewed: The patient's medical and social history Their use of alcohol, tobacco or illicit drugs Their current medications and supplements The patient's functional ability including ADLs,fall risks, home safety risks, cognitive, and hearing and visual impairment Diet and physical activities Evidence for depression or mood disorders  The patient's weight, height, BMI, and visual acuity have been recorded in the chart.  I have made referrals,  counseling, and provided education to the patient based on review of the above and I have provided the patient with a written personalized care plan for preventive services.     Loreen Freud, DO   03/31/2012

## 2012-04-01 ENCOUNTER — Other Ambulatory Visit: Payer: Medicare Other

## 2012-04-30 ENCOUNTER — Telehealth: Payer: Self-pay | Admitting: Family Medicine

## 2012-04-30 NOTE — Telephone Encounter (Signed)
Rec'd from [facility] Forward 3 pages to [facility] 04/30/12

## 2012-05-13 ENCOUNTER — Telehealth: Payer: Self-pay | Admitting: Gastroenterology

## 2012-05-13 NOTE — Telephone Encounter (Signed)
Rec'd from [facility] group forward 3 pages to [facility] 05/13/12 js

## 2012-05-21 ENCOUNTER — Other Ambulatory Visit: Payer: Self-pay | Admitting: Family Medicine

## 2012-05-21 ENCOUNTER — Telehealth: Payer: Self-pay | Admitting: Gastroenterology

## 2012-05-21 NOTE — Telephone Encounter (Signed)
I did not see scanned in colonoscopy reports from [place].

## 2012-05-24 NOTE — Telephone Encounter (Signed)
Records received in medical records, Dr Christella Hartigan has not reviewed we have not received those here I will call medical records and find out where they are

## 2012-05-26 NOTE — Telephone Encounter (Signed)
Pt records were sent to Dr Laury Axon and was sent back to our medical records dept for Dr Christella Hartigan records were not sent to Dr Christella Hartigan so Kimberly Escobar has resent the request for the records to be faxed again.

## 2012-05-26 NOTE — Telephone Encounter (Signed)
Kimberly Escobar with medical records is researching where the medical records were sent and will call back

## 2012-05-27 NOTE — Telephone Encounter (Signed)
Records received put on Dr Christella Hartigan desk for review

## 2012-05-28 NOTE — Telephone Encounter (Signed)
The notes that were sent mention nothing about a colonoscopy.  Can you please discuss with patient again, where and when did she have the colonoscopy (as best that she can remember)?  In 2012 office visit she said she had a colonoscopy in 2010 and a polyp was removed, would like the procedure and path report.  If we cannot find those, then she should probably repeat the exam now to be safe (may have been advanced adenoma in 2010 which dictates repeat exam in 3 years).

## 2012-05-28 NOTE — Telephone Encounter (Signed)
No records have been found for a colonoscopy.  Pt agreed to have procedure Pt has been scheduled for previsit and colon 06/21/12 and 06/28/12.  Pt aware

## 2012-06-02 ENCOUNTER — Encounter: Payer: Self-pay | Admitting: Lab

## 2012-06-03 ENCOUNTER — Other Ambulatory Visit (INDEPENDENT_AMBULATORY_CARE_PROVIDER_SITE_OTHER): Payer: Medicare Other

## 2012-06-03 DIAGNOSIS — Z136 Encounter for screening for cardiovascular disorders: Secondary | ICD-10-CM

## 2012-06-03 DIAGNOSIS — K219 Gastro-esophageal reflux disease without esophagitis: Secondary | ICD-10-CM

## 2012-06-03 DIAGNOSIS — Z0000 Encounter for general adult medical examination without abnormal findings: Secondary | ICD-10-CM

## 2012-06-03 LAB — BASIC METABOLIC PANEL
CO2: 29 mEq/L (ref 19–32)
Calcium: 9.6 mg/dL (ref 8.4–10.5)
Creatinine, Ser: 0.7 mg/dL (ref 0.4–1.2)
Glucose, Bld: 82 mg/dL (ref 70–99)

## 2012-06-03 LAB — LIPID PANEL
Total CHOL/HDL Ratio: 3
Triglycerides: 73 mg/dL (ref 0.0–149.0)

## 2012-06-03 LAB — CBC WITH DIFFERENTIAL/PLATELET
Basophils Absolute: 0 10*3/uL (ref 0.0–0.1)
Eosinophils Absolute: 0.1 10*3/uL (ref 0.0–0.7)
Lymphocytes Relative: 41.8 % (ref 12.0–46.0)
MCHC: 34 g/dL (ref 30.0–36.0)
Neutrophils Relative %: 48.1 % (ref 43.0–77.0)
RBC: 4.06 Mil/uL (ref 3.87–5.11)
RDW: 13.6 % (ref 11.5–14.6)

## 2012-06-03 LAB — HEPATIC FUNCTION PANEL
AST: 17 U/L (ref 0–37)
Albumin: 4.1 g/dL (ref 3.5–5.2)
Alkaline Phosphatase: 68 U/L (ref 39–117)

## 2012-06-21 ENCOUNTER — Ambulatory Visit (AMBULATORY_SURGERY_CENTER): Payer: Medicare Other | Admitting: *Deleted

## 2012-06-21 ENCOUNTER — Encounter: Payer: Self-pay | Admitting: Gastroenterology

## 2012-06-21 VITALS — Ht 65.0 in | Wt 164.8 lb

## 2012-06-21 DIAGNOSIS — Z1211 Encounter for screening for malignant neoplasm of colon: Secondary | ICD-10-CM

## 2012-06-21 MED ORDER — MOVIPREP 100 G PO SOLR: 1.0000 | Freq: Once | ORAL | Status: DC

## 2012-06-21 NOTE — Progress Notes (Signed)
No egg or soy allergy. ewm No home 02 use. ewm No problems with sedation in the past. ewm Last colon 10 years ago in [place]. We have records per pt. ewm emmi video sent to pt's email . ewm

## 2012-06-25 ENCOUNTER — Encounter: Payer: Self-pay | Admitting: Family Medicine

## 2012-06-28 ENCOUNTER — Encounter: Payer: Self-pay | Admitting: Gastroenterology

## 2012-06-28 ENCOUNTER — Ambulatory Visit (AMBULATORY_SURGERY_CENTER): Payer: Medicare Other | Admitting: Gastroenterology

## 2012-06-28 VITALS — BP 91/67 | HR 66 | Temp 97.4°F | Resp 22 | Ht 65.0 in | Wt 164.0 lb

## 2012-06-28 DIAGNOSIS — Z1211 Encounter for screening for malignant neoplasm of colon: Secondary | ICD-10-CM

## 2012-06-28 DIAGNOSIS — D126 Benign neoplasm of colon, unspecified: Secondary | ICD-10-CM

## 2012-06-28 MED ORDER — SODIUM CHLORIDE 0.9 % IV SOLN: 500.0000 mL | INTRAVENOUS | Status: DC

## 2012-06-28 NOTE — Patient Instructions (Addendum)
Discharge instructions given with verbal understanding. Handout on polyps given. Resume previous medications. YOU HAD AN ENDOSCOPIC PROCEDURE TODAY AT THE [facility]: Refer to the procedure report that was given to you for any specific questions about what was found during the examination.  If the procedure report does not answer your questions, please call your gastroenterologist to clarify.  If you requested that your care partner not be given the details of your procedure findings, then the procedure report has been included in a sealed envelope for you to review at your convenience later.  YOU SHOULD EXPECT: Some feelings of bloating in the abdomen. Passage of more gas than usual.  Walking can help get rid of the air that was put into your GI tract during the procedure and reduce the bloating. If you had a lower endoscopy (such as a colonoscopy or flexible sigmoidoscopy) you may notice spotting of blood in your stool or on the toilet paper. If you underwent a bowel prep for your procedure, then you may not have a normal bowel movement for a few days.  DIET: Your first meal following the procedure should be a light meal and then it is ok to progress to your normal diet.  A half-sandwich or bowl of soup is an example of a good first meal.  Heavy or fried foods are harder to digest and may make you feel nauseous or bloated.  Likewise meals heavy in dairy and vegetables can cause extra gas to form and this can also increase the bloating.  Drink plenty of fluids but you should avoid alcoholic beverages for 24 hours.  ACTIVITY: Your care partner should take you home directly after the procedure.  You should plan to take it easy, moving slowly for the rest of the day.  You can resume normal activity the day after the procedure however you should NOT DRIVE or use heavy machinery for 24 hours (because of the sedation medicines used during the test).    SYMPTOMS TO REPORT IMMEDIATELY: A  gastroenterologist can be reached at any hour.  During normal business hours, 8:30 AM to 5:00 PM Monday through Friday, call [phone].  After hours and on weekends, please call the GI answering service at [phone] who will take a message and have the physician on call contact you.   Following lower endoscopy (colonoscopy or flexible sigmoidoscopy):  Excessive amounts of blood in the stool  Significant tenderness or worsening of abdominal pains  Swelling of the abdomen that is new, acute  Fever of 100F or higher  FOLLOW UP: If any biopsies were taken you will be contacted by phone or by letter within the next 1-3 weeks.  Call your gastroenterologist if you have not heard about the biopsies in 3 weeks.  Our staff will call the home number listed on your records the next business day following your procedure to check on you and address any questions or concerns that you may have at that time regarding the information given to you following your procedure. This is a courtesy call and so if there is no answer at the home number and we have not heard from you through the emergency physician on call, we will assume that you have returned to your regular daily activities without incident.  SIGNATURES/CONFIDENTIALITY: You and/or your care partner have signed paperwork which will be entered into your electronic medical record.  These signatures attest to the fact that that the information above on your After Visit Summary has   been reviewed and is understood.  Full responsibility of the confidentiality of this discharge information lies with you and/or your care-partner. 

## 2012-06-28 NOTE — Op Note (Signed)
[facility] [address] [place], [address]   COLONOSCOPY PROCEDURE REPORT  PATIENT: Kimberly, Escobar  MR#: #[id number] BIRTHDATE: Aug 21, 1955 , 56  yrs. old GENDER: Female ENDOSCOPIST: Rachael Fee, MD PROCEDURE DATE:  06/28/2012 PROCEDURE:   Colonoscopy with snare polypectomy ASA CLASS:   Class II INDICATIONS:colonoscopy 2010, unable to obtain records after multiple tries; screening MEDICATIONS: Fentanyl 50 mcg IV and Versed 7 mg IV  DESCRIPTION OF PROCEDURE:   After the risks benefits and alternatives of the procedure were thoroughly explained, informed consent was obtained.  A digital rectal exam revealed no abnormalities of the rectum.   The [id number]  endoscope was introduced through the anus and advanced to the cecum, which was identified by both the appendix and ileocecal valve. No adverse events experienced.   The quality of the prep was good.  The instrument was then slowly withdrawn as the colon was fully examined.    COLON FINDINGS: One polyp was found, removed and sent to path.  This was semi-pedunculated, 4mm across, located in sigmoid section, removed with cold snare.  The examination was otherwise normal. Retroflexed views revealed no abnormalities. The time to cecum=2 minutes 05 seconds.  Withdrawal time=10 minutes 52 seconds.  The scope was withdrawn and the procedure completed. COMPLICATIONS: There were no complications.  ENDOSCOPIC IMPRESSION: One polyp was found, removed and sent to path. The examination was otherwise normal.  RECOMMENDATIONS: If the polyp(s) removed today are proven to be adenomatous (pre-cancerous) polyps, you will need a repeat colonoscopy in 5 years.  Otherwise you should continue to follow colorectal cancer screening guidelines for "routine risk" patients with colonoscopy in 10 years.  You will receive a letter within 1-2 weeks with the results of your biopsy as well as final recommendations.   Please call my office if you have not received a letter after 3 weeks.   eSigned:  Rachael Fee, MD 06/28/2012 9:14 AM   cc: Loreen Freud,  MD

## 2012-06-28 NOTE — Progress Notes (Signed)
Patient did not experience any of the following events: a burn prior to discharge; a fall within the facility; wrong site/side/patient/procedure/implant event; or a hospital transfer or hospital admission upon discharge from the facility. (G8907) Patient did not have preoperative order for IV antibiotic SSI prophylaxis. (G8918)  

## 2012-06-29 ENCOUNTER — Telehealth: Payer: Self-pay | Admitting: *Deleted

## 2012-06-29 NOTE — Telephone Encounter (Signed)
  Follow up Call-  Call back number 06/28/2012 12/23/2010  Post procedure Call Back phone  # [phone] leave message  Permission to leave phone message Yes -     Patient questions:  Do you have a fever, pain , or abdominal swelling? no Pain Score  0 *  Have you tolerated food without any problems? yes  Have you been able to return to your normal activities? yes  Do you have any questions about your discharge instructions: Diet   no Medications  no Follow up visit  no  Do you have questions or concerns about your Care? no  Actions: * If pain score is 4 or above: No action needed, pain <4.

## 2012-07-02 ENCOUNTER — Encounter: Payer: Self-pay | Admitting: Gastroenterology

## 2012-11-29 ENCOUNTER — Encounter: Payer: Self-pay | Admitting: Family Medicine

## 2012-11-29 ENCOUNTER — Ambulatory Visit (INDEPENDENT_AMBULATORY_CARE_PROVIDER_SITE_OTHER): Payer: Medicare Other | Admitting: Family Medicine

## 2012-11-29 VITALS — BP 126/72 | HR 80 | Temp 98.5°F | Wt 168.0 lb

## 2012-11-29 DIAGNOSIS — L738 Other specified follicular disorders: Secondary | ICD-10-CM

## 2012-11-29 DIAGNOSIS — N764 Abscess of vulva: Secondary | ICD-10-CM

## 2012-11-29 DIAGNOSIS — L739 Follicular disorder, unspecified: Secondary | ICD-10-CM

## 2012-11-29 DIAGNOSIS — B354 Tinea corporis: Secondary | ICD-10-CM

## 2012-11-29 MED ORDER — CEPHALEXIN 500 MG PO CAPS: 500.0000 mg | ORAL_CAPSULE | Freq: Two times a day (BID) | ORAL | Status: DC

## 2012-11-29 MED ORDER — NYSTATIN 100000 UNIT/GM EX CREA: 1.0000 "application " | TOPICAL_CREAM | Freq: Two times a day (BID) | CUTANEOUS | Status: DC

## 2012-11-29 NOTE — Patient Instructions (Addendum)
Folliculitis  Folliculitis is redness, soreness, and swelling (inflammation) of the hair follicles. This condition can occur anywhere on the body. People with weakened immune systems, diabetes, or obesity have a greater risk of getting folliculitis. CAUSES  Bacterial infection. This is the most common cause.  Fungal infection.  Viral infection.  Contact with certain chemicals, especially oils and tars. Long-term folliculitis can result from bacteria that live in the nostrils. The bacteria may trigger multiple outbreaks of folliculitis over time. SYMPTOMS Folliculitis most commonly occurs on the scalp, thighs, legs, back, buttocks, and areas where hair is shaved frequently. An early sign of folliculitis is a small, white or yellow, pus-filled, itchy lesion (pustule). These lesions appear on a red, inflamed follicle. They are usually less than 0.2 inches (5 mm) wide. When there is an infection of the follicle that goes deeper, it becomes a boil or furuncle. A group of closely packed boils creates a larger lesion (carbuncle). Carbuncles tend to occur in hairy, sweaty areas of the body. DIAGNOSIS  Your caregiver can usually tell what is wrong by doing a physical exam. A sample may be taken from one of the lesions and tested in a lab. This can help determine what is causing your folliculitis. TREATMENT  Treatment may include:  Applying warm compresses to the affected areas.  Taking antibiotic medicines orally or applying them to the skin.  Draining the lesions if they contain a large amount of pus or fluid.  Laser hair removal for cases of long-lasting folliculitis. This helps to prevent regrowth of the hair. HOME CARE INSTRUCTIONS  Apply warm compresses to the affected areas as directed by your caregiver.  If antibiotics are prescribed, take them as directed. Finish them even if you start to feel better.  You may take over-the-counter medicines to relieve itching.  Do not shave  irritated skin.  Follow up with your caregiver as directed. SEEK IMMEDIATE MEDICAL CARE IF:   You have increasing redness, swelling, or pain in the affected area.  You have a fever. MAKE SURE YOU:  Understand these instructions.  Will watch your condition.  Will get help right away if you are not doing well or get worse. Document Released: 03/10/2001 Document Revised: 07/01/2011 Document Reviewed: 04/01/2011 Exit[employer] Patient Information 2014 [employer].  

## 2012-11-29 NOTE — Progress Notes (Signed)
Pre visit review using our clinic review tool, if applicable. No additional management support is needed unless otherwise documented below in the visit note. 

## 2012-12-02 DIAGNOSIS — N764 Abscess of vulva: Secondary | ICD-10-CM | POA: Insufficient documentation

## 2012-12-02 NOTE — Assessment & Plan Note (Signed)
Warm compresses abx per orders Refer to gyn if no improvement

## 2012-12-02 NOTE — Progress Notes (Signed)
  Subjective:    Patient ID: Kimberly Escobar, female    DOB: 14-Jun-1955, 57 y.o.   MRN: [id number]  HPI Pt here c/o rash in vaginal area that burns and is painful.   + drainage.  X sevaral days Review of Systems As above    Objective:   Physical Exam BP 126/72  Pulse 80  Temp(Src) 98.5 F (36.9 C) (Oral)  Wt 168 lb (76.204 kg)  SpO2 97% General appearance: alert, cooperative, appears stated age and no distress Pelvic: + abscess --+ drainage about quarter size        Assessment & Plan:

## 2012-12-03 LAB — WOUND CULTURE
Gram Stain: NONE SEEN
Gram Stain: NONE SEEN

## 2012-12-06 LAB — VIRAL CULTURE VIRC: Organism ID, Bacteria: NEGATIVE

## 2012-12-14 ENCOUNTER — Other Ambulatory Visit: Payer: Self-pay | Admitting: Family Medicine

## 2013-04-07 ENCOUNTER — Encounter: Payer: Medicare Other | Admitting: Family Medicine

## 2013-04-25 ENCOUNTER — Ambulatory Visit (INDEPENDENT_AMBULATORY_CARE_PROVIDER_SITE_OTHER): Payer: Medicare Other | Admitting: Physician Assistant

## 2013-04-25 ENCOUNTER — Encounter: Payer: Self-pay | Admitting: Physician Assistant

## 2013-04-25 ENCOUNTER — Telehealth: Payer: Self-pay

## 2013-04-25 VITALS — BP 100/65 | HR 96 | Temp 98.7°F | Resp 16 | Ht 65.0 in | Wt 173.2 lb

## 2013-04-25 DIAGNOSIS — H6690 Otitis media, unspecified, unspecified ear: Secondary | ICD-10-CM

## 2013-04-25 DIAGNOSIS — B9789 Other viral agents as the cause of diseases classified elsewhere: Secondary | ICD-10-CM

## 2013-04-25 DIAGNOSIS — H6692 Otitis media, unspecified, left ear: Secondary | ICD-10-CM

## 2013-04-25 DIAGNOSIS — J069 Acute upper respiratory infection, unspecified: Secondary | ICD-10-CM

## 2013-04-25 MED ORDER — AMOXICILLIN 875 MG PO TABS: 875.0000 mg | ORAL_TABLET | Freq: Two times a day (BID) | ORAL | Status: DC

## 2013-04-25 MED ORDER — HYDROCOD POLST-CHLORPHEN POLST 10-8 MG/5ML PO LQCR: 5.0000 mL | Freq: Two times a day (BID) | ORAL | Status: DC | PRN

## 2013-04-25 NOTE — Telephone Encounter (Signed)
Medication and allergies:  Reviewed and updated Local pharmacy:  [id number] - [place], [place] - [address] AT [address] No changes to personal, family history or past surgical hx  Patient was seen in the ER at [facility] on 04/14/13 for c/o of left sided chest pain. She described the pain as chest pressure on the left side under her left axilla and across her left breast. She shares that the pain started after lifting light weights and doing Zumba.  She was evaluated and treated for costochondritis.  D/c'd on the same day and per patient was given 3 pain medications and dulcolax.  She did not have the medications with her and could not remember the names of them.  She states that she has been taking her medications as prescribed.  She feels much better and is no longer experiencing chest pain.  Patient was advised to continue taking medications as prescribed, to bring them in with her during her hospital follow-up,  and to avoid excessive exercise.  She stated understanding and agreed.   Follow-up appointment scheduled for Wednesday, April 15th at 1130 am with Dr. Etter Sjogren.

## 2013-04-25 NOTE — Progress Notes (Signed)
Pre visit review using our clinic review tool, if applicable. No additional management support is needed unless otherwise documented below in the visit note/SLS  

## 2013-04-25 NOTE — Patient Instructions (Addendum)
Please take antibiotic as directed with food.  Use Tussionex for cough.  Increase fluid intake.  Rest.  Saline nasal spray.  Place a humidifier in the bedroom.  Call or return to clinic if symptoms are not improving.
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
## 2013-04-25 NOTE — Assessment & Plan Note (Signed)
Increase fluid intake.  Rest.  Use medication for cough.  Humidifier in bedroom.

## 2013-04-25 NOTE — Assessment & Plan Note (Signed)
Rx Amoxicillin.  Encouraged Flonase.

## 2013-04-27 ENCOUNTER — Encounter: Payer: Self-pay | Admitting: Family Medicine

## 2013-04-27 ENCOUNTER — Ambulatory Visit (INDEPENDENT_AMBULATORY_CARE_PROVIDER_SITE_OTHER): Payer: Medicare Other | Admitting: Family Medicine

## 2013-04-27 VITALS — BP 122/66 | HR 86 | Temp 98.7°F | Wt 170.0 lb

## 2013-04-27 DIAGNOSIS — M940 Chondrocostal junction syndrome [Tietze]: Secondary | ICD-10-CM

## 2013-04-27 NOTE — Progress Notes (Signed)
  Subjective:    Kimberly Escobar is a 58 y.o. female who presents for evaluation of chest wall pain. Onset was 2 weeks ago. Symptoms have been resolved since that time. The patient describes the pain as sharp in the costochondral region:  on the left. Patient rates pain as a 10/10 in intensity at its worst. Associated symptoms are: none. Aggravating factors are: exercise. Alleviating factors are: medications percocet, valium and naprosyn and rest. Mechanism of injury: weights and yoga?. Previous visits for this problem: none. Evaluation to date: er visit at [facility]. Treatment to date: opioid analgesics: effective and prescription NSAIDs: effective and valium.   The following portions of the patient's history were reviewed and updated as appropriate: allergies, current medications, past family history, past medical history, past social history, past surgical history and problem list.  Review of Systems Pertinent items are noted in HPI.   Objective:    BP 122/66  Pulse 86  Temp(Src) 98.7 F (37.1 C) (Oral)  Wt 170 lb (77.111 kg)  SpO2 96% General appearance: alert, cooperative, appears stated age and no distress Ears: normal TM's and external ear canals both ears Nose: Nares normal. Septum midline. Mucosa normal. No drainage or sinus tenderness. Throat: lips, mucosa, and tongue normal; teeth and gums normal Neck: no adenopathy, no carotid bruit, no JVD, supple, symmetrical, trachea midline and thyroid not enlarged, symmetric, no tenderness/mass/nodules Lungs: clear to auscultation bilaterally Heart: S1, S2 normal Extremities: extremities normal, atraumatic, no cyanosis or edema  Imaging Chest x-ray: normal chest x-ray   Assessment:    Costochondritis   Plan:  Pain has resolved Sent for ER records rto if symptoms return  Follow up as needed

## 2013-04-27 NOTE — Patient Instructions (Signed)

## 2013-04-27 NOTE — Progress Notes (Signed)
Pre visit review using our clinic review tool, if applicable. No additional management support is needed unless otherwise documented below in the visit note. 

## 2013-05-24 ENCOUNTER — Telehealth: Payer: Self-pay

## 2013-05-24 NOTE — Telephone Encounter (Signed)
Left message for call back Non-identifiable   Pap- 12/12/11- normal CCS- 06/28/2012- benign polyp; repeat in 10 years (06/2022) MMG- 12/04/11- negative BD- 11/26/10- osteopenia Td-11/17/11

## 2013-05-25 ENCOUNTER — Encounter: Payer: Self-pay | Admitting: Family Medicine

## 2013-05-25 ENCOUNTER — Ambulatory Visit (INDEPENDENT_AMBULATORY_CARE_PROVIDER_SITE_OTHER): Payer: Medicare Other | Admitting: Family Medicine

## 2013-05-25 VITALS — BP 130/82 | HR 77 | Temp 98.4°F | Resp 16 | Ht 65.25 in | Wt 171.0 lb

## 2013-05-25 DIAGNOSIS — Z0000 Encounter for general adult medical examination without abnormal findings: Secondary | ICD-10-CM

## 2013-05-25 DIAGNOSIS — E2839 Other primary ovarian failure: Secondary | ICD-10-CM

## 2013-05-25 DIAGNOSIS — Z1239 Encounter for other screening for malignant neoplasm of breast: Secondary | ICD-10-CM

## 2013-05-25 DIAGNOSIS — R609 Edema, unspecified: Secondary | ICD-10-CM

## 2013-05-25 DIAGNOSIS — R6882 Decreased libido: Secondary | ICD-10-CM

## 2013-05-25 DIAGNOSIS — G5600 Carpal tunnel syndrome, unspecified upper limb: Secondary | ICD-10-CM

## 2013-05-25 DIAGNOSIS — Z136 Encounter for screening for cardiovascular disorders: Secondary | ICD-10-CM

## 2013-05-25 LAB — CBC WITH DIFFERENTIAL/PLATELET
BASOS PCT: 0.5 % (ref 0.0–3.0)
Basophils Absolute: 0 10*3/uL (ref 0.0–0.1)
EOS ABS: 0.1 10*3/uL (ref 0.0–0.7)
EOS PCT: 1.2 % (ref 0.0–5.0)
HEMATOCRIT: 38.4 % (ref 36.0–46.0)
Hemoglobin: 12.9 g/dL (ref 12.0–15.0)
LYMPHS ABS: 2.4 10*3/uL (ref 0.7–4.0)
Lymphocytes Relative: 45.9 % (ref 12.0–46.0)
MCHC: 33.6 g/dL (ref 30.0–36.0)
MCV: 95.9 fl (ref 78.0–100.0)
MONO ABS: 0.4 10*3/uL (ref 0.1–1.0)
Monocytes Relative: 7.7 % (ref 3.0–12.0)
NEUTROS ABS: 2.4 10*3/uL (ref 1.4–7.7)
Neutrophils Relative %: 44.7 % (ref 43.0–77.0)
Platelets: 274 10*3/uL (ref 150.0–400.0)
RBC: 4.01 Mil/uL (ref 3.87–5.11)
RDW: 13.4 % (ref 11.5–15.5)
WBC: 5.3 10*3/uL (ref 4.0–10.5)

## 2013-05-25 LAB — LIPID PANEL
CHOLESTEROL: 195 mg/dL (ref 0–200)
HDL: 53.4 mg/dL (ref >39.00)
LDL Cholesterol: 122 mg/dL — ABNORMAL HIGH (ref 0–99)
TRIGLYCERIDES: 97 mg/dL (ref 0.0–149.0)
Total CHOL/HDL Ratio: 4
VLDL: 19.4 mg/dL (ref 0.0–40.0)

## 2013-05-25 LAB — HEPATIC FUNCTION PANEL
ALBUMIN: 4.3 g/dL (ref 3.5–5.2)
ALT: 21 U/L (ref 0–35)
AST: 17 U/L (ref 0–37)
Alkaline Phosphatase: 63 U/L (ref 39–117)
Bilirubin, Direct: 0 mg/dL (ref 0.0–0.3)
TOTAL PROTEIN: 7.8 g/dL (ref 6.0–8.3)
Total Bilirubin: 0.6 mg/dL (ref 0.2–1.2)

## 2013-05-25 LAB — BASIC METABOLIC PANEL
BUN: 15 mg/dL (ref 6–23)
CHLORIDE: 104 meq/L (ref 96–112)
CO2: 29 meq/L (ref 19–32)
CREATININE: 0.7 mg/dL (ref 0.4–1.2)
Calcium: 9.7 mg/dL (ref 8.4–10.5)
GFR: 104.69 mL/min (ref >60.00)
Glucose, Bld: 85 mg/dL (ref 70–99)
POTASSIUM: 4.3 meq/L (ref 3.5–5.1)
Sodium: 138 mEq/L (ref 135–145)

## 2013-05-25 LAB — TSH: TSH: 0.26 u[IU]/mL — ABNORMAL LOW (ref 0.35–4.50)

## 2013-05-25 MED ORDER — HYDROCHLOROTHIAZIDE 25 MG PO TABS: 25.0000 mg | ORAL_TABLET | Freq: Every day | ORAL | Status: DC

## 2013-05-25 MED ORDER — BUPROPION HCL ER (XL) 150 MG PO TB24: 150.0000 mg | ORAL_TABLET | Freq: Every day | ORAL | Status: DC

## 2013-05-25 NOTE — Progress Notes (Signed)
Subjective:     Kimberly Escobar is a 58 y.o. female and is here for a comprehensive physical exam. The patient reports swelling in low ext,  cts symptoms worsening in both hands.    History   Social History  . Marital Status: Married    Spouse Name: N/A    Number of Children: 4  . Years of Education: N/A   Occupational History  . housewife Other   Social History Main Topics  . Smoking status: Never Smoker   . Smokeless tobacco: Never Used  . Alcohol Use: 1.8 oz/week    3 Glasses of wine per week  . Drug Use: No  . Sexual Activity: Yes    Partners: Male   Other Topics Concern  . Not on file   Social History Narrative   Exercise --walk 3x a week   Health Maintenance  Topic Date Due  . Influenza Vaccine  08/13/2013  . Mammogram  12/03/2013  . Pap Smear  12/12/2014  . Tetanus/tdap  11/16/2021  . Colonoscopy  06/29/2022    The following portions of the patient's history were reviewed and updated as appropriate:  She  has a past medical history of Migraines; Environmental allergies; Back pain; Carpal tunnel syndrome of right wrist; Arthritis; and Acute costochondritis. She  does not have any pertinent problems on file. She  has past surgical history that includes Cesarean section; Colonoscopy; and Upper gastrointestinal endoscopy. Her family history includes Arthritis in an other family member; Breast cancer in her sister; Heart disease in her mother and sister; Hypertension in her mother and sister; Stroke in her sister. There is no history of Colon cancer, Rectal cancer, or Stomach cancer. She  reports that she has never smoked. She has never used smokeless tobacco. She reports that she drinks about 1.8 ounces of alcohol per week. She reports that she does not use illicit drugs. She has a current medication list which includes the following prescription(s): acyclovir ointment, aspirin, calcium carbonate-vitamin d, evening primrose oil, multivitamin, multivitamin-lutein,  pyridoxine hcl, rizatriptan, tramadol, valacyclovir, zolpidem, bupropion, hydrochlorothiazide, and ibuprofen. Current Outpatient Prescriptions on File Prior to Visit  Medication Sig Dispense Refill  . acyclovir ointment (ZOVIRAX) 5 % APPLY TOPICALLY EVERY 3 HOURS AS DIRECTED  15 g  0  . aspirin 81 MG tablet Take 81 mg by mouth daily.      . Calcium Carbonate-Vitamin D (CALCIUM + D PO) Take 4 tablets by mouth daily.        Marland Kitchen EVENING PRIMROSE OIL PO Take 1 capsule by mouth 2 (two) times daily before a meal. 1300 mg twice a day for hot flashes      . Multiple Vitamin (MULTIVITAMIN) capsule Take 1 capsule by mouth daily.        . multivitamin-lutein (OCUVITE-LUTEIN) CAPS Take 1 capsule by mouth daily.      . Pyridoxine HCl (VITAMIN B6 PO) Take 2 tablets by mouth daily.        . rizatriptan (MAXALT-MLT) 10 MG disintegrating tablet DISSOLVE 1 TABLET BY MOUTH AS NEEDED FOR MIGRAINE. MAY REPEAT IN 2 HOURS AS NEEDED  10 tablet  0  . traMADol (ULTRAM) 50 MG tablet Take 1 tablet (50 mg total) by mouth every 6 (six) hours as needed. Maximum dose= 8 tablets per day  30 tablet  1  . valACYclovir (VALTREX) 500 MG tablet TAKE 1 TABLET BY MOUTH EVERY DAY  30 tablet  2  . zolpidem (AMBIEN) 5 MG tablet Take 1 tablet (5  mg total) by mouth at bedtime as needed for sleep.  15 tablet  0   No current facility-administered medications on file prior to visit.   She has No Known Allergies..  Review of Systems Review of Systems  Constitutional: Negative for activity change, appetite change and fatigue.  HENT: Negative for hearing loss, congestion, tinnitus and ear discharge.  dentist q41m Eyes: Negative for visual disturbance (see optho q1y -- vision corrected to 20/20 with glasses).  Respiratory: Negative for cough, chest tightness and shortness of breath.   Cardiovascular: Negative for chest pain, palpitations and leg swelling.  Gastrointestinal: Negative for abdominal pain, diarrhea, constipation and abdominal  distention.  Genitourinary: Negative for urgency, frequency, decreased urine volume and difficulty urinating.  Musculoskeletal: Negative for back pain, arthralgias and gait problem.  Skin: Negative for color change, pallor and rash.  Neurological: Negative for dizziness, light-headedness, numbness and headaches.  Hematological: Negative for adenopathy. Does not bruise/bleed easily.  Psychiatric/Behavioral: Negative for suicidal ideas, confusion, sleep disturbance, self-injury, dysphoric mood, decreased concentration and agitation.       Objective:    BP 130/82  Pulse 77  Temp(Src) 98.4 F (36.9 C) (Oral)  Resp 16  Ht 5' 5.25" (1.657 m)  Wt 171 lb (77.565 kg)  BMI 28.25 kg/m2  SpO2 99% General appearance: alert, cooperative, appears stated age and no distress Head: Normocephalic, without obvious abnormality, atraumatic Eyes: conjunctivae/corneas clear. PERRL, EOM's intact. Fundi benign. Ears: normal TM's and external ear canals both ears Nose: Nares normal. Septum midline. Mucosa normal. No drainage or sinus tenderness. Throat: lips, mucosa, and tongue normal; teeth and gums normal Neck: no adenopathy, no carotid bruit, no JVD, supple, symmetrical, trachea midline and thyroid not enlarged, symmetric, no tenderness/mass/nodules Back: symmetric, no curvature. ROM normal. No CVA tenderness. Lungs: clear to auscultation bilaterally Breasts: normal appearance, no masses or tenderness Heart: regular rate and rhythm, S1, S2 normal, no murmur, click, rub or gallop Abdomen: soft, non-tender; bowel sounds normal; no masses,  no organomegaly Pelvic: not indicated; post-menopausal, no abnormal Pap smears in past Extremities: extremities normal, atraumatic, no cyanosis or edema Pulses: 2+ and symmetric Skin: Skin color, texture, turgor normal. No rashes or lesions Lymph nodes: Cervical, supraclavicular, and axillary nodes normal. Neurologic: Alert and oriented X 3, normal strength and tone.  Normal symmetric reflexes. Normal coordination and gait Psych-- no depression, no anxiety      Assessment:    Healthy female exam.      Plan:    ghm utd  Check labs See After Visit Summary for Counseling Recommendations   1. CTS (carpal tunnel syndrome) Where splint at night  - Ambulatory referral to Hand Surgery  2. Other screening breast examination   - MM DIGITAL SCREENING BILATERAL; Future  3. Estrogen deficiency   - DG Bone Density; Future  4. Preventative health care   - Basic metabolic panel - CBC with Differential - Hepatic function panel - Lipid panel - POCT urinalysis dipstick - TSH  5. Decreased libido Start med - buPROPion (WELLBUTRIN XL) 150 MG 24 hr tablet; Take 1 tablet (150 mg total) by mouth daily. 1 po q am x 1 week then 2 po q am then call for new rx  Dispense: 60 tablet; Refill: 0  6. Edema  Elevated, wear compression socks - hydrochlorothiazide (HYDRODIURIL) 25 MG tablet; Take 1 tablet (25 mg total) by mouth daily. 1 po qd prn  Dispense: 30 tablet; Refill: 11

## 2013-05-25 NOTE — Patient Instructions (Signed)

## 2013-05-25 NOTE — Progress Notes (Signed)
Pre-visit discussion using our clinic review too, as applicablel. No additional management support is needed unless otherwise documented below in the visit note.

## 2013-06-03 NOTE — Telephone Encounter (Signed)
Unable to reach patient pre visit.  

## 2013-06-07 ENCOUNTER — Telehealth: Payer: Self-pay | Admitting: Family Medicine

## 2013-06-07 DIAGNOSIS — E0590 Thyrotoxicosis, unspecified without thyrotoxic crisis or storm: Secondary | ICD-10-CM

## 2013-06-07 NOTE — Telephone Encounter (Signed)
Hyperthyroids---- recheck 3 months Tsh, free f3, Free t4 , Dx hyperthyroid   Discussed with patient and she voiced understanding. Orders in and apt scheduled.     KP

## 2013-06-07 NOTE — Telephone Encounter (Signed)
Caller name: Dalayla  Call back number: [phone]  Reason for call:  Pt got lab results and wants to speak with someone.

## 2013-08-17 ENCOUNTER — Encounter: Payer: Self-pay | Admitting: Family Medicine

## 2013-08-26 ENCOUNTER — Encounter: Payer: Medicare Other | Admitting: Medical

## 2013-09-07 ENCOUNTER — Other Ambulatory Visit (INDEPENDENT_AMBULATORY_CARE_PROVIDER_SITE_OTHER): Payer: Medicare Other

## 2013-09-07 DIAGNOSIS — E0590 Thyrotoxicosis, unspecified without thyrotoxic crisis or storm: Secondary | ICD-10-CM

## 2013-09-07 LAB — T3, FREE: T3 FREE: 2.9 pg/mL (ref 2.3–4.2)

## 2013-09-07 LAB — TSH: TSH: 1.08 u[IU]/mL (ref 0.35–4.50)

## 2013-09-07 LAB — T4, FREE: Free T4: 1 ng/dL (ref 0.60–1.60)

## 2013-09-14 ENCOUNTER — Telehealth: Payer: Self-pay | Admitting: Family Medicine

## 2013-09-14 NOTE — Telephone Encounter (Signed)
Caller name: Louvenia Relation to pt: self Call back [phone] Pharmacy:  Reason for call:  Patient called in requesting results. I informed patient that the results have been mailed to her and told her what the letter states. She states that she is not taking synthroid.

## 2013-09-14 NOTE — Telephone Encounter (Signed)
It appears the patient was never on the medication.  Please advise.

## 2013-09-15 ENCOUNTER — Encounter: Payer: Medicare Other | Admitting: Medical

## 2013-09-15 NOTE — Telephone Encounter (Signed)
Patient made aware and voiced understanding.  She has agreed to recheck in 2 months    KP

## 2013-09-15 NOTE — Telephone Encounter (Signed)
Than recheck in 2 months  244.9  TSH

## 2013-09-16 ENCOUNTER — Ambulatory Visit (INDEPENDENT_AMBULATORY_CARE_PROVIDER_SITE_OTHER): Payer: Medicare Other | Admitting: Medical

## 2013-09-16 ENCOUNTER — Encounter: Payer: Self-pay | Admitting: Medical

## 2013-09-16 VITALS — BP 120/78 | HR 80 | Temp 99.1°F | Ht 64.75 in | Wt 171.6 lb

## 2013-09-16 DIAGNOSIS — Z1231 Encounter for screening mammogram for malignant neoplasm of breast: Secondary | ICD-10-CM

## 2013-09-16 DIAGNOSIS — Z0000 Encounter for general adult medical examination without abnormal findings: Secondary | ICD-10-CM

## 2013-09-16 MED ORDER — ZOSTER VACCINE LIVE 19400 UNT/0.65ML ~~LOC~~ SOLR: 0.6500 mL | Freq: Once | SUBCUTANEOUS | Status: DC

## 2013-09-16 NOTE — Patient Instructions (Addendum)
Your mammogram is due in November. Influenza vaccine is recommended in October. Shingles vaccine is recommended and I sent that to your pharmacy. I did refer you to audiologist for screening eval.    Preventive Care for Adults A healthy lifestyle and preventive care can promote health and wellness. Preventive health guidelines for women include the following key practices.  A routine yearly physical is a good way to check with your health care provider about your health and preventive screening. It is a chance to share any concerns and updates on your health and to receive a thorough exam.  Visit your dentist for a routine exam and preventive care every 6 months. Brush your teeth twice a day and floss once a day. Good oral hygiene prevents tooth decay and gum disease.  The frequency of eye exams is based on your age, health, family medical history, use of contact lenses, and other factors. Follow your health care provider's recommendations for frequency of eye exams.  Eat a healthy diet. Foods like vegetables, fruits, whole grains, low-fat dairy products, and lean protein foods contain the nutrients you need without too many calories. Decrease your intake of foods high in solid fats, added sugars, and salt. Eat the right amount of calories for you.Get information about a proper diet from your health care provider, if necessary.  Regular physical exercise is one of the most important things you can do for your health. Most adults should get at least 150 minutes of moderate-intensity exercise (any activity that increases your heart rate and causes you to sweat) each week. In addition, most adults need muscle-strengthening exercises on 2 or more days a week.  Maintain a healthy weight. The body mass index (BMI) is a screening tool to identify possible weight problems. It provides an estimate of body fat based on height and weight. Your health care provider can find your BMI and can help you achieve or  maintain a healthy weight.For adults 20 years and older:  A BMI below 18.5 is considered underweight.  A BMI of 18.5 to 24.9 is normal.  A BMI of 25 to 29.9 is considered overweight.  A BMI of 30 and above is considered obese.  Maintain normal blood lipids and cholesterol levels by exercising and minimizing your intake of saturated fat. Eat a balanced diet with plenty of fruit and vegetables. Blood tests for lipids and cholesterol should begin at age 55 and be repeated every 5 years. If your lipid or cholesterol levels are high, you are over 50, or you are at high risk for heart disease, you may need your cholesterol levels checked more frequently.Ongoing high lipid and cholesterol levels should be treated with medicines if diet and exercise are not working.  If you smoke, find out from your health care provider how to quit. If you do not use tobacco, do not start.  Lung cancer screening is recommended for adults aged 67-80 years who are at high risk for developing lung cancer because of a history of smoking. A yearly low-dose CT scan of the lungs is recommended for people who have at least a 30-pack-year history of smoking and are a current smoker or have quit within the past 15 years. A pack year of smoking is smoking an average of 1 pack of cigarettes a day for 1 year (for example: 1 pack a day for 30 years or 2 packs a day for 15 years). Yearly screening should continue until the smoker has stopped smoking for at least 15  years. Yearly screening should be stopped for people who develop a health problem that would prevent them from having lung cancer treatment.  If you are pregnant, do not drink alcohol. If you are breastfeeding, be very cautious about drinking alcohol. If you are not pregnant and choose to drink alcohol, do not have more than 1 drink per day. One drink is considered to be 12 ounces (355 mL) of beer, 5 ounces (148 mL) of wine, or 1.5 ounces (44 mL) of liquor.  Avoid use of  street drugs. Do not share needles with anyone. Ask for help if you need support or instructions about stopping the use of drugs.  High blood pressure causes heart disease and increases the risk of stroke. Your blood pressure should be checked at least every 1 to 2 years. Ongoing high blood pressure should be treated with medicines if weight loss and exercise do not work.  If you are 30-50 years old, ask your health care provider if you should take aspirin to prevent strokes.  Diabetes screening involves taking a blood sample to check your fasting blood sugar level. This should be done once every 3 years, after age 54, if you are within normal weight and without risk factors for diabetes. Testing should be considered at a younger age or be carried out more frequently if you are overweight and have at least 1 risk factor for diabetes.  Breast cancer screening is essential preventive care for women. You should practice "breast self-awareness." This means understanding the normal appearance and feel of your breasts and may include breast self-examination. Any changes detected, no matter how small, should be reported to a health care provider. Women in their 43s and 30s should have a clinical breast exam (CBE) by a health care provider as part of a regular health exam every 1 to 3 years. After age 41, women should have a CBE every year. Starting at age 31, women should consider having a mammogram (breast X-ray test) every year. Women who have a family history of breast cancer should talk to their health care provider about genetic screening. Women at a high risk of breast cancer should talk to their health care providers about having an MRI and a mammogram every year.  Breast cancer gene (BRCA)-related cancer risk assessment is recommended for women who have family members with BRCA-related cancers. BRCA-related cancers include breast, ovarian, tubal, and peritoneal cancers. Having family members with these  cancers may be associated with an increased risk for harmful changes (mutations) in the breast cancer genes BRCA1 and BRCA2. Results of the assessment will determine the need for genetic counseling and BRCA1 and BRCA2 testing.  Routine pelvic exams to screen for cancer are no longer recommended for nonpregnant women who are considered low risk for cancer of the pelvic organs (ovaries, uterus, and vagina) and who do not have symptoms. Ask your health care provider if a screening pelvic exam is right for you.  If you have had past treatment for cervical cancer or a condition that could lead to cancer, you need Pap tests and screening for cancer for at least 20 years after your treatment. If Pap tests have been discontinued, your risk factors (such as having a new sexual partner) need to be reassessed to determine if screening should be resumed. Some women have medical problems that increase the chance of getting cervical cancer. In these cases, your health care provider may recommend more frequent screening and Pap tests.  The HPV test is an  additional test that may be used for cervical cancer screening. The HPV test looks for the virus that can cause the cell changes on the cervix. The cells collected during the Pap test can be tested for HPV. The HPV test could be used to screen women aged 34 years and older, and should be used in women of any age who have unclear Pap test results. After the age of 62, women should have HPV testing at the same frequency as a Pap test.  Colorectal cancer can be detected and often prevented. Most routine colorectal cancer screening begins at the age of 48 years and continues through age 66 years. However, your health care provider may recommend screening at an earlier age if you have risk factors for colon cancer. On a yearly basis, your health care provider may provide home test kits to check for hidden blood in the stool. Use of a small camera at the end of a tube, to  directly examine the colon (sigmoidoscopy or colonoscopy), can detect the earliest forms of colorectal cancer. Talk to your health care provider about this at age 58, when routine screening begins. Direct exam of the colon should be repeated every 5-10 years through age 96 years, unless early forms of pre-cancerous polyps or small growths are found.  People who are at an increased risk for hepatitis B should be screened for this virus. You are considered at high risk for hepatitis B if:  You were born in a country where hepatitis B occurs often. Talk with your health care provider about which countries are considered high risk.  Your parents were born in a high-risk country and you have not received a shot to protect against hepatitis B (hepatitis B vaccine).  You have HIV or AIDS.  You use needles to inject street drugs.  You live with, or have sex with, someone who has hepatitis B.  You get hemodialysis treatment.  You take certain medicines for conditions like cancer, organ transplantation, and autoimmune conditions.  Hepatitis C blood testing is recommended for all people born from [birth year] through 1965 and any individual with known risks for hepatitis C.  Practice safe sex. Use condoms and avoid high-risk sexual practices to reduce the spread of sexually transmitted infections (STIs). STIs include gonorrhea, chlamydia, syphilis, trichomonas, herpes, HPV, and human immunodeficiency virus (HIV). Herpes, HIV, and HPV are viral illnesses that have no cure. They can result in disability, cancer, and death.  You should be screened for sexually transmitted illnesses (STIs) including gonorrhea and chlamydia if:  You are sexually active and are younger than 24 years.  You are older than 24 years and your health care provider tells you that you are at risk for this type of infection.  Your sexual activity has changed since you were last screened and you are at an increased risk for chlamydia or  gonorrhea. Ask your health care provider if you are at risk.  If you are at risk of being infected with HIV, it is recommended that you take a prescription medicine daily to prevent HIV infection. This is called preexposure prophylaxis (PrEP). You are considered at risk if:  You are a heterosexual woman, are sexually active, and are at increased risk for HIV infection.  You take drugs by injection.  You are sexually active with a partner who has HIV.  Talk with your health care provider about whether you are at high risk of being infected with HIV. If you choose to begin PrEP, you  should first be tested for HIV. You should then be tested every 3 months for as long as you are taking PrEP.  Osteoporosis is a disease in which the bones lose minerals and strength with aging. This can result in serious bone fractures or breaks. The risk of osteoporosis can be identified using a bone density scan. Women ages 35 years and over and women at risk for fractures or osteoporosis should discuss screening with their health care providers. Ask your health care provider whether you should take a calcium supplement or vitamin D to reduce the rate of osteoporosis.  Menopause can be associated with physical symptoms and risks. Hormone replacement therapy is available to decrease symptoms and risks. You should talk to your health care provider about whether hormone replacement therapy is right for you.  Use sunscreen. Apply sunscreen liberally and repeatedly throughout the day. You should seek shade when your shadow is shorter than you. Protect yourself by wearing long sleeves, pants, a wide-brimmed hat, and sunglasses year round, whenever you are outdoors.  Once a month, do a whole body skin exam, using a mirror to look at the skin on your back. Tell your health care provider of new moles, moles that have irregular borders, moles that are larger than a pencil eraser, or moles that have changed in shape or  color.  Stay current with required vaccines (immunizations).  Influenza vaccine. All adults should be immunized every year.  Tetanus, diphtheria, and acellular pertussis (Td, Tdap) vaccine. Pregnant women should receive 1 dose of Tdap vaccine during each pregnancy. The dose should be obtained regardless of the length of time since the last dose. Immunization is preferred during the 27th-36th week of gestation. An adult who has not previously received Tdap or who does not know her vaccine status should receive 1 dose of Tdap. This initial dose should be followed by tetanus and diphtheria toxoids (Td) booster doses every 10 years. Adults with an unknown or incomplete history of completing a 3-dose immunization series with Td-containing vaccines should begin or complete a primary immunization series including a Tdap dose. Adults should receive a Td booster every 10 years.  Varicella vaccine. An adult without evidence of immunity to varicella should receive 2 doses or a second dose if she has previously received 1 dose. Pregnant females who do not have evidence of immunity should receive the first dose after pregnancy. This first dose should be obtained before leaving the health care facility. The second dose should be obtained 4-8 weeks after the first dose.  Human papillomavirus (HPV) vaccine. Females aged 13-26 years who have not received the vaccine previously should obtain the 3-dose series. The vaccine is not recommended for use in pregnant females. However, pregnancy testing is not needed before receiving a dose. If a female is found to be pregnant after receiving a dose, no treatment is needed. In that case, the remaining doses should be delayed until after the pregnancy. Immunization is recommended for any person with an immunocompromised condition through the age of 25 years if she did not get any or all doses earlier. During the 3-dose series, the second dose should be obtained 4-8 weeks after the  first dose. The third dose should be obtained 24 weeks after the first dose and 16 weeks after the second dose.  Zoster vaccine. One dose is recommended for adults aged 55 years or older unless certain conditions are present.  Measles, mumps, and rubella (MMR) vaccine. Adults born before [birth year] generally are considered immune  to measles and mumps. Adults born in [birth year] or later should have 1 or more doses of MMR vaccine unless there is a contraindication to the vaccine or there is laboratory evidence of immunity to each of the three diseases. A routine second dose of MMR vaccine should be obtained at least 28 days after the first dose for students attending postsecondary schools, health care workers, or international travelers. People who received inactivated measles vaccine or an unknown type of measles vaccine during 1963-1967 should receive 2 doses of MMR vaccine. People who received inactivated mumps vaccine or an unknown type of mumps vaccine before 1979 and are at high risk for mumps infection should consider immunization with 2 doses of MMR vaccine. For females of childbearing age, rubella immunity should be determined. If there is no evidence of immunity, females who are not pregnant should be vaccinated. If there is no evidence of immunity, females who are pregnant should delay immunization until after pregnancy. Unvaccinated health care workers born before [birth year] who lack laboratory evidence of measles, mumps, or rubella immunity or laboratory confirmation of disease should consider measles and mumps immunization with 2 doses of MMR vaccine or rubella immunization with 1 dose of MMR vaccine.  Pneumococcal 13-valent conjugate (PCV13) vaccine. When indicated, a person who is uncertain of her immunization history and has no record of immunization should receive the PCV13 vaccine. An adult aged 67 years or older who has certain medical conditions and has not been previously immunized should receive 1 dose of  PCV13 vaccine. This PCV13 should be followed with a dose of pneumococcal polysaccharide (PPSV23) vaccine. The PPSV23 vaccine dose should be obtained at least 8 weeks after the dose of PCV13 vaccine. An adult aged 98 years or older who has certain medical conditions and previously received 1 or more doses of PPSV23 vaccine should receive 1 dose of PCV13. The PCV13 vaccine dose should be obtained 1 or more years after the last PPSV23 vaccine dose.  Pneumococcal polysaccharide (PPSV23) vaccine. When PCV13 is also indicated, PCV13 should be obtained first. All adults aged 3 years and older should be immunized. An adult younger than age 44 years who has certain medical conditions should be immunized. Any person who resides in a nursing home or long-term care facility should be immunized. An adult smoker should be immunized. People with an immunocompromised condition and certain other conditions should receive both PCV13 and PPSV23 vaccines. People with human immunodeficiency virus (HIV) infection should be immunized as soon as possible after diagnosis. Immunization during chemotherapy or radiation therapy should be avoided. Routine use of PPSV23 vaccine is not recommended for American Indians, [place] Natives, or people younger than 65 years unless there are medical conditions that require PPSV23 vaccine. When indicated, people who have unknown immunization and have no record of immunization should receive PPSV23 vaccine. One-time revaccination 5 years after the first dose of PPSV23 is recommended for people aged 19-64 years who have chronic kidney failure, nephrotic syndrome, asplenia, or immunocompromised conditions. People who received 1-2 doses of PPSV23 before age 31 years should receive another dose of PPSV23 vaccine at age 21 years or later if at least 5 years have passed since the previous dose. Doses of PPSV23 are not needed for people immunized with PPSV23 at or after age 12 years.  Meningococcal vaccine.  Adults with asplenia or persistent complement component deficiencies should receive 2 doses of quadrivalent meningococcal conjugate (MenACWY-D) vaccine. The doses should be obtained at least 2 months apart. Microbiologists working with certain  meningococcal bacteria, [employer] recruits, people at risk during an outbreak, and people who travel to or live in countries with a high rate of meningitis should be immunized. A first-year college student up through age 35 years who is living in a residence hall should receive a dose if she did not receive a dose on or after her 16th birthday. Adults who have certain high-risk conditions should receive one or more doses of vaccine.  Hepatitis A vaccine. Adults who wish to be protected from this disease, have certain high-risk conditions, work with hepatitis A-infected animals, work in hepatitis A research labs, or travel to or work in countries with a high rate of hepatitis A should be immunized. Adults who were previously unvaccinated and who anticipate close contact with an international adoptee during the first 60 days after arrival in the [place] from a country with a high rate of hepatitis A should be immunized.  Hepatitis B vaccine. Adults who wish to be protected from this disease, have certain high-risk conditions, may be exposed to blood or other infectious body fluids, are household contacts or sex partners of hepatitis B positive people, are clients or workers in certain care facilities, or travel to or work in countries with a high rate of hepatitis B should be immunized.  Haemophilus influenzae type b (Hib) vaccine. A previously unvaccinated person with asplenia or sickle cell disease or having a scheduled splenectomy should receive 1 dose of Hib vaccine. Regardless of previous immunization, a recipient of a hematopoietic stem cell transplant should receive a 3-dose series 6-12 months after her successful transplant. Hib vaccine is not recommended for  adults with HIV infection. Preventive Services / Frequency Ages 1 to 62 years  Blood pressure check.** / Every 1 to 2 years.  Lipid and cholesterol check.** / Every 5 years beginning at age 31.  Clinical breast exam.** / Every 3 years for women in their 21s and 60s.  BRCA-related cancer risk assessment.** / For women who have family members with a BRCA-related cancer (breast, ovarian, tubal, or peritoneal cancers).  Pap test.** / Every 2 years from ages 18 through 35. Every 3 years starting at age 35 through age 25 or 58 with a history of 3 consecutive normal Pap tests.  HPV screening.** / Every 3 years from ages 108 through ages 48 to 78 with a history of 3 consecutive normal Pap tests.  Hepatitis C blood test.** / For any individual with known risks for hepatitis C.  Skin self-exam. / Monthly.  Influenza vaccine. / Every year.  Tetanus, diphtheria, and acellular pertussis (Tdap, Td) vaccine.** / Consult your health care provider. Pregnant women should receive 1 dose of Tdap vaccine during each pregnancy. 1 dose of Td every 10 years.  Varicella vaccine.** / Consult your health care provider. Pregnant females who do not have evidence of immunity should receive the first dose after pregnancy.  HPV vaccine. / 3 doses over 6 months, if 50 and younger. The vaccine is not recommended for use in pregnant females. However, pregnancy testing is not needed before receiving a dose.  Measles, mumps, rubella (MMR) vaccine.** / You need at least 1 dose of MMR if you were born in 1957 or later. You may also need a 2nd dose. For females of childbearing age, rubella immunity should be determined. If there is no evidence of immunity, females who are not pregnant should be vaccinated. If there is no evidence of immunity, females who are pregnant should delay immunization until after pregnancy.  Pneumococcal 13-valent conjugate (PCV13) vaccine.** / Consult your health care provider.  Pneumococcal  polysaccharide (PPSV23) vaccine.** / 1 to 2 doses if you smoke cigarettes or if you have certain conditions.  Meningococcal vaccine.** / 1 dose if you are age 28 to 78 years and a Market researcher living in a residence hall, or have one of several medical conditions, you need to get vaccinated against meningococcal disease. You may also need additional booster doses.  Hepatitis A vaccine.** / Consult your health care provider.  Hepatitis B vaccine.** / Consult your health care provider.  Haemophilus influenzae type b (Hib) vaccine.** / Consult your health care provider. Ages 81 to 102 years  Blood pressure check.** / Every 1 to 2 years.  Lipid and cholesterol check.** / Every 5 years beginning at age 76 years.  Lung cancer screening. / Every year if you are aged 55-80 years and have a 30-pack-year history of smoking and currently smoke or have quit within the past 15 years. Yearly screening is stopped once you have quit smoking for at least 15 years or develop a health problem that would prevent you from having lung cancer treatment.  Clinical breast exam.** / Every year after age 5 years.  BRCA-related cancer risk assessment.** / For women who have family members with a BRCA-related cancer (breast, ovarian, tubal, or peritoneal cancers).  Mammogram.** / Every year beginning at age 58 years and continuing for as long as you are in good health. Consult with your health care provider.  Pap test.** / Every 3 years starting at age 29 years through age 57 or 67 years with a history of 3 consecutive normal Pap tests.  HPV screening.** / Every 3 years from ages 27 years through ages 8 to 80 years with a history of 3 consecutive normal Pap tests.  Fecal occult blood test (FOBT) of stool. / Every year beginning at age 58 years and continuing until age 8 years. You may not need to do this test if you get a colonoscopy every 10 years.  Flexible sigmoidoscopy or colonoscopy.** / Every 5  years for a flexible sigmoidoscopy or every 10 years for a colonoscopy beginning at age 80 years and continuing until age 1 years.  Hepatitis C blood test.** / For all people born from [birth year] through 1967 and any individual with known risks for hepatitis C.  Skin self-exam. / Monthly.  Influenza vaccine. / Every year.  Tetanus, diphtheria, and acellular pertussis (Tdap/Td) vaccine.** / Consult your health care provider. Pregnant women should receive 1 dose of Tdap vaccine during each pregnancy. 1 dose of Td every 10 years.  Varicella vaccine.** / Consult your health care provider. Pregnant females who do not have evidence of immunity should receive the first dose after pregnancy.  Zoster vaccine.** / 1 dose for adults aged 61 years or older.  Measles, mumps, rubella (MMR) vaccine.** / You need at least 1 dose of MMR if you were born in 1959 or later. You may also need a 2nd dose. For females of childbearing age, rubella immunity should be determined. If there is no evidence of immunity, females who are not pregnant should be vaccinated. If there is no evidence of immunity, females who are pregnant should delay immunization until after pregnancy.  Pneumococcal 13-valent conjugate (PCV13) vaccine.** / Consult your health care provider.  Pneumococcal polysaccharide (PPSV23) vaccine.** / 1 to 2 doses if you smoke cigarettes or if you have certain conditions.  Meningococcal vaccine.** / Consult your health care provider.  Hepatitis A vaccine.** / Consult your health care provider.  Hepatitis B vaccine.** / Consult your health care provider.  Haemophilus influenzae type b (Hib) vaccine.** / Consult your health care provider. Ages 35 years and over  Blood pressure check.** / Every 1 to 2 years.  Lipid and cholesterol check.** / Every 5 years beginning at age 53 years.  Lung cancer screening. / Every year if you are aged 55-80 years and have a 30-pack-year history of smoking and currently  smoke or have quit within the past 15 years. Yearly screening is stopped once you have quit smoking for at least 15 years or develop a health problem that would prevent you from having lung cancer treatment.  Clinical breast exam.** / Every year after age 68 years.  BRCA-related cancer risk assessment.** / For women who have family members with a BRCA-related cancer (breast, ovarian, tubal, or peritoneal cancers).  Mammogram.** / Every year beginning at age 10 years and continuing for as long as you are in good health. Consult with your health care provider.  Pap test.** / Every 3 years starting at age 42 years through age 74 or 79 years with 3 consecutive normal Pap tests. Testing can be stopped between 65 and 70 years with 3 consecutive normal Pap tests and no abnormal Pap or HPV tests in the past 10 years.  HPV screening.** / Every 3 years from ages 68 years through ages 41 or 64 years with a history of 3 consecutive normal Pap tests. Testing can be stopped between 65 and 70 years with 3 consecutive normal Pap tests and no abnormal Pap or HPV tests in the past 10 years.  Fecal occult blood test (FOBT) of stool. / Every year beginning at age 64 years and continuing until age 26 years. You may not need to do this test if you get a colonoscopy every 10 years.  Flexible sigmoidoscopy or colonoscopy.** / Every 5 years for a flexible sigmoidoscopy or every 10 years for a colonoscopy beginning at age 35 years and continuing until age 65 years.  Hepatitis C blood test.** / For all people born from [birth year] through 1967 and any individual with known risks for hepatitis C.  Osteoporosis screening.** / A one-time screening for women ages 52 years and over and women at risk for fractures or osteoporosis.  Skin self-exam. / Monthly.  Influenza vaccine. / Every year.  Tetanus, diphtheria, and acellular pertussis (Tdap/Td) vaccine.** / 1 dose of Td every 10 years.  Varicella vaccine.** / Consult your  health care provider.  Zoster vaccine.** / 1 dose for adults aged 85 years or older.  Pneumococcal 13-valent conjugate (PCV13) vaccine.** / Consult your health care provider.  Pneumococcal polysaccharide (PPSV23) vaccine.** / 1 dose for all adults aged 63 years and older.  Meningococcal vaccine.** / Consult your health care provider.  Hepatitis A vaccine.** / Consult your health care provider.  Hepatitis B vaccine.** / Consult your health care provider.  Haemophilus influenzae type b (Hib) vaccine.** / Consult your health care provider. ** Family history and personal history of risk and conditions may change your health care provider's recommendations. Document Released: 02/25/2001 Document Revised: 05/16/2013 Document Reviewed: 05/27/2010 [facility] Patient Information 2015 [place], [place]. This information is not intended to replace advice given to you by your health care provider. Make sure you discuss any questions you have with your health care provider.  Please think about these vaccines and let us know. Also I can send you to to audiologist for formal  more in depth testing.

## 2013-09-16 NOTE — Progress Notes (Signed)
  Subjective:    Kimberly Escobar is a 58 y.o. female who presents for a welcome to Medicare exam.   Pt in addition to wellness exam patient had acute concern and complaint about her memory. She was tested on mini-mental status exam and her score was 29. She only missed one item on recall.  On review she is up to date with dexascan, lipid panel, ekg, and coloncopy. She is due for a mammogram in  novembver 2015.    Cardiac risk factors: dyslipidemia, family history of premature cardiovascular disease, obesity (BMI >= 30 kg/m2) and sedentary lifestyle. Correction pt bmi is less than 30.  Activities of Daily Living  In your present state of health, do you have any difficulty performing the following activities?:  Preparing food and eating?: No Bathing yourself: No Getting dressed: No Using the toilet:No Moving around from place to place: No In the past year have you fallen or had a near fall?:No  Current exercise habits: The patient does not participate in regular exercise at present.   Dietary issues discussed: none  Depression Screen (Note: if answer to either of the following is "Yes", then a more complete depression screening is indicated)  Q1: Over the past two weeks, have you felt down, depressed or hopeless?no Q2: Over the past two weeks, have you felt little interest or pleasure in doing things? no   Review of Systems A comprehensive review of systems was negative. She has some Rt knee pain for which see orthopedist. Some bilateral feet pain as well.   Objective:    Vision by Snellen chart: right eye:20/20 both eyes with glasses. 20/25 each eyes and seperate., left eye:20/25 Blood pressure 120/78, pulse 80, temperature 99.1 F (37.3 C), temperature source Oral, height 5' 4.75" (1.645 m), weight 171 lb 9.6 oz (77.837 kg), SpO2 98.00%. Body mass index is 28.76 kg/(m^2). General appearance: alert, cooperative, appears stated age and no distress  Neck- from, no nuccal  rigidity. Lungs-CTA. Heart-RRR. Abdomen- Soft, nontender, nondistended, positive bowel sounds, no rebound or guarding and no organomegaly. Neuro- CNIII-XII grossly intact. Negative rhomberg. No gross motor or sensory function deficits. Musculoskeletal- RT knee normal flexion and extension but crepitus. Bilateral feet- normal exam. No pain on palption on plantar fascia or heels.   Assessment:        This is a routine wellness  examination for this patient . I reviewed all health maintenance protocols including mammography, colonoscopy, bone density Needed referrals were placed. Age and diagnosis  appropriate screening labs were ordered. Her immunization history was reviewed and appropriate vaccinations were ordered. Her current medications and allergies were reviewed and needed refills of her chronic medications were ordered. The plan for yearly health maintenance was discussed all orders and referrals were made as appropriate.   Plan:    During the course of the visit the patient was educated and counseled about appropriate screening and preventive services including:   Counseld on getting fluvaccine. Will refer to audiology. Get pap smear and mammogram done in novemver when due.  Patient Instructions (the written plan) was given to the patient.  Correction for above her pap is not due until 2016.

## 2013-09-26 ENCOUNTER — Other Ambulatory Visit: Payer: Self-pay | Admitting: Orthopedic Surgery

## 2013-10-04 ENCOUNTER — Ambulatory Visit (HOSPITAL_BASED_OUTPATIENT_CLINIC_OR_DEPARTMENT_OTHER): Admission: RE | Admit: 2013-10-04 | Payer: Medicare Other | Source: Ambulatory Visit | Admitting: Orthopedic Surgery

## 2013-10-04 ENCOUNTER — Encounter (HOSPITAL_BASED_OUTPATIENT_CLINIC_OR_DEPARTMENT_OTHER): Admission: RE | Payer: Self-pay | Source: Ambulatory Visit

## 2013-10-04 SURGERY — CARPAL TUNNEL RELEASE
Anesthesia: Choice | Laterality: Right

## 2013-12-01 ENCOUNTER — Telehealth: Payer: Self-pay | Admitting: Family Medicine

## 2013-12-01 MED ORDER — ELETRIPTAN HYDROBROMIDE 20 MG PO TABS: 20.0000 mg | ORAL_TABLET | ORAL | Status: DC | PRN

## 2013-12-01 NOTE — Telephone Encounter (Signed)
VM left advising Rx was sent.      KP

## 2013-12-01 NOTE — Telephone Encounter (Signed)
Patient is on Maxalt ODT. Please advise    KP

## 2013-12-01 NOTE — Telephone Encounter (Signed)
relpax 20 mg 1 po x1, may repeat in 2h x1  #10

## 2013-12-01 NOTE — Telephone Encounter (Signed)
Pt wants to know if dr. Etter Sjogren can give her a new rx for her migraines, pt states she does not want the pills that dissolved in her mouth, states that they make her more nausea, would like to try something else if possible.

## 2013-12-05 ENCOUNTER — Telehealth: Payer: Self-pay | Admitting: *Deleted

## 2013-12-05 NOTE — Telephone Encounter (Signed)
Prior authorization initiated for Relpax 20 mg tabs. Awaiting determination. JG//CMA

## 2013-12-06 NOTE — Telephone Encounter (Signed)
Relpax PA approved through 01/13/2015. Reference #: [id number]. Approval letter sent for scanning. JG//CMA

## 2014-01-18 ENCOUNTER — Other Ambulatory Visit: Payer: Self-pay | Admitting: Orthopedic Surgery

## 2014-01-27 ENCOUNTER — Encounter (HOSPITAL_BASED_OUTPATIENT_CLINIC_OR_DEPARTMENT_OTHER): Payer: Self-pay | Admitting: *Deleted

## 2014-01-27 NOTE — Progress Notes (Signed)
No labs needed-has been several months since she took hctz-

## 2014-01-30 ENCOUNTER — Telehealth: Payer: Self-pay | Admitting: Family Medicine

## 2014-01-30 ENCOUNTER — Ambulatory Visit (INDEPENDENT_AMBULATORY_CARE_PROVIDER_SITE_OTHER): Payer: Medicare Other | Admitting: Family Medicine

## 2014-01-30 ENCOUNTER — Encounter: Payer: Self-pay | Admitting: Family Medicine

## 2014-01-30 VITALS — BP 115/76 | HR 75 | Temp 98.8°F | Wt 174.0 lb

## 2014-01-30 DIAGNOSIS — M25512 Pain in left shoulder: Secondary | ICD-10-CM | POA: Diagnosis not present

## 2014-01-30 DIAGNOSIS — M79672 Pain in left foot: Secondary | ICD-10-CM | POA: Diagnosis not present

## 2014-01-30 DIAGNOSIS — M79671 Pain in right foot: Secondary | ICD-10-CM | POA: Diagnosis not present

## 2014-01-30 DIAGNOSIS — J069 Acute upper respiratory infection, unspecified: Secondary | ICD-10-CM

## 2014-01-30 DIAGNOSIS — H6503 Acute serous otitis media, bilateral: Secondary | ICD-10-CM

## 2014-01-30 MED ORDER — FLUTICASONE PROPIONATE 50 MCG/ACT NA SUSP: 2.0000 | Freq: Every day | NASAL | Status: DC

## 2014-01-30 MED ORDER — CEFUROXIME AXETIL 500 MG PO TABS: 500.0000 mg | ORAL_TABLET | Freq: Two times a day (BID) | ORAL | Status: AC

## 2014-01-30 MED ORDER — LORATADINE 10 MG PO TABS: 10.0000 mg | ORAL_TABLET | Freq: Every day | ORAL | Status: DC

## 2014-01-30 MED ORDER — CYCLOBENZAPRINE HCL 10 MG PO TABS: 10.0000 mg | ORAL_TABLET | Freq: Three times a day (TID) | ORAL | Status: DC | PRN

## 2014-01-30 NOTE — Progress Notes (Signed)
Subjective:     Kimberly Escobar is a 59 y.o. female who presents for evaluation of symptoms of a URI. Symptoms include right ear pressure/pain, no  fever, non productive cough and post nasal drip. Onset of symptoms was 2 weeks ago, and has been gradually worsening since that time. Treatment to date: antihistamines.  The following portions of the patient's history were reviewed and updated as appropriate:  She  has a past medical history of Migraines; Environmental allergies; Back pain; Carpal tunnel syndrome of right wrist; Arthritis; Acute costochondritis; and Wears glasses. She  does not have any pertinent problems on file. She  has past surgical history that includes Cesarean section; Colonoscopy; Upper gastrointestinal endoscopy; and Tubal ligation. Her family history includes Arthritis in an other family member; Breast cancer in her sister; Heart disease in her mother and sister; Hypertension in her mother and sister; Stroke in her sister. There is no history of Colon cancer, Rectal cancer, or Stomach cancer. She  reports that she has never smoked. She has never used smokeless tobacco. She reports that she drinks about 1.8 oz of alcohol per week. She reports that she does not use illicit drugs. She has a current medication list which includes the following prescription(s): acyclovir ointment, aspirin, bupropion, calcium carbonate-vitamin d, eletriptan, evening primrose oil, hydrochlorothiazide, ibuprofen, multivitamin, multivitamin-lutein, pyridoxine hcl, tramadol, and valacyclovir. Current Outpatient Prescriptions on File Prior to Visit  Medication Sig Dispense Refill  . acyclovir ointment (ZOVIRAX) 5 % APPLY TOPICALLY EVERY 3 HOURS AS DIRECTED 15 g 0  . aspirin 81 MG tablet Take 81 mg by mouth daily.    Marland Kitchen buPROPion (WELLBUTRIN XL) 150 MG 24 hr tablet Take 1 tablet (150 mg total) by mouth daily. 1 po q am x 1 week then 2 po q am then call for new rx 60 tablet 0  . Calcium Carbonate-Vitamin D  (CALCIUM + D PO) Take 4 tablets by mouth daily.      Marland Kitchen eletriptan (RELPAX) 20 MG tablet Take 1 tablet (20 mg total) by mouth as needed for migraine or headache. May repeat in 2 hours if headache persists or recurs. 10 tablet 0  . EVENING PRIMROSE OIL PO Take 1 capsule by mouth 2 (two) times daily before a meal. 1300 mg twice a day for hot flashes    . hydrochlorothiazide (HYDRODIURIL) 25 MG tablet Take 1 tablet (25 mg total) by mouth daily. 1 po qd prn 30 tablet 11  . ibuprofen (ADVIL,MOTRIN) 800 MG tablet     . Multiple Vitamin (MULTIVITAMIN) capsule Take 1 capsule by mouth daily.      . multivitamin-lutein (OCUVITE-LUTEIN) CAPS Take 1 capsule by mouth daily.    . Pyridoxine HCl (VITAMIN B6 PO) Take 2 tablets by mouth daily.      . traMADol (ULTRAM) 50 MG tablet Take 1 tablet (50 mg total) by mouth every 6 (six) hours as needed. Maximum dose= 8 tablets per day 30 tablet 1  . valACYclovir (VALTREX) 500 MG tablet TAKE 1 TABLET BY MOUTH EVERY DAY 30 tablet 2   No current facility-administered medications on file prior to visit.   She is allergic to codeine..  Review of Systems As above  Objective:    BP 115/76 mmHg  Pulse 75  Temp(Src) 98.8 F (37.1 C) (Oral)  Wt 174 lb (78.926 kg)  SpO2 100% General appearance: alert, cooperative, appears stated age and no distress Ears: + fluid b/l and errythema Nose: Nares normal. Septum midline. Mucosa normal. No drainage  or sinus tenderness., green discharge, moderate congestion, turbinates red, swollen, no sinus tenderness, no swelling Throat: lips, mucosa, and tongue normal; teeth and gums normal Neck: no adenopathy, supple, symmetrical, trachea midline and thyroid not enlarged, symmetric, no tenderness/mass/nodules Lungs: clear to auscultation bilaterally Heart: S1, S2 normal Extremities: pain iin both feet with weight bearing--- she is requesting a podiatrist   Assessment:     1. Bilateral acute serous otitis media, recurrence not  specified  - cefUROXime (CEFTIN) 500 MG tablet; Take 1 tablet (500 mg total) by mouth 2 (two) times daily.  Dispense: 20 tablet; Refill: 0 - Ambulatory referral to Sports Medicine  2. URI (upper respiratory infection)  - fluticasone (FLONASE) 50 MCG/ACT nasal spray; Place 2 sprays into both nostrils daily.  Dispense: 16 g; Refill: 6 - loratadine (CLARITIN) 10 MG tablet; Take 1 tablet (10 mg total) by mouth daily.  Dispense: 30 tablet; Refill: 11  3. Left shoulder pain  - cyclobenzaprine (FLEXERIL) 10 MG tablet; Take 1 tablet (10 mg total) by mouth 3 (three) times daily as needed for muscle spasms.  Dispense: 30 tablet; Refill: 0  4. Foot pain, bilateral  - Ambulatory referral to Sports Medicine   Plan:

## 2014-01-30 NOTE — Patient Instructions (Signed)

## 2014-01-30 NOTE — Progress Notes (Signed)
Pre visit review using our clinic review tool, if applicable. No additional management support is needed unless otherwise documented below in the visit note. 

## 2014-01-30 NOTE — Telephone Encounter (Signed)
error:[id number] ° °

## 2014-02-01 ENCOUNTER — Ambulatory Visit: Payer: Medicare Other | Admitting: Family Medicine

## 2014-02-02 ENCOUNTER — Ambulatory Visit (HOSPITAL_BASED_OUTPATIENT_CLINIC_OR_DEPARTMENT_OTHER): Payer: Medicare Other | Admitting: Anesthesiology

## 2014-02-02 ENCOUNTER — Encounter (HOSPITAL_BASED_OUTPATIENT_CLINIC_OR_DEPARTMENT_OTHER)
Admission: RE | Disposition: A | Discharge status: Home / Self Care | Payer: Self-pay | Source: Ambulatory Visit | Attending: Orthopedic Surgery

## 2014-02-02 ENCOUNTER — Encounter (HOSPITAL_BASED_OUTPATIENT_CLINIC_OR_DEPARTMENT_OTHER): Payer: Self-pay | Admitting: *Deleted

## 2014-02-02 ENCOUNTER — Ambulatory Visit (HOSPITAL_BASED_OUTPATIENT_CLINIC_OR_DEPARTMENT_OTHER)
Admission: RE | Admit: 2014-02-02 | Discharge: 2014-02-02 | Disposition: A | Discharge status: Home / Self Care | Payer: Medicare Other | Source: Ambulatory Visit | Attending: Orthopedic Surgery | Admitting: Orthopedic Surgery

## 2014-02-02 DIAGNOSIS — Z7982 Long term (current) use of aspirin: Secondary | ICD-10-CM | POA: Diagnosis not present

## 2014-02-02 DIAGNOSIS — Z791 Long term (current) use of non-steroidal anti-inflammatories (NSAID): Secondary | ICD-10-CM | POA: Insufficient documentation

## 2014-02-02 DIAGNOSIS — Z79899 Other long term (current) drug therapy: Secondary | ICD-10-CM | POA: Diagnosis not present

## 2014-02-02 DIAGNOSIS — G5601 Carpal tunnel syndrome, right upper limb: Secondary | ICD-10-CM | POA: Diagnosis not present

## 2014-02-02 DIAGNOSIS — K219 Gastro-esophageal reflux disease without esophagitis: Secondary | ICD-10-CM | POA: Diagnosis not present

## 2014-02-02 DIAGNOSIS — G43909 Migraine, unspecified, not intractable, without status migrainosus: Secondary | ICD-10-CM | POA: Diagnosis not present

## 2014-02-02 DIAGNOSIS — M13869 Other specified arthritis, unspecified knee: Secondary | ICD-10-CM | POA: Diagnosis not present

## 2014-02-02 HISTORY — PX: CARPAL TUNNEL RELEASE: SHX101

## 2014-02-02 HISTORY — DX: Presence of spectacles and contact lenses: Z97.3

## 2014-02-02 LAB — POCT HEMOGLOBIN-HEMACUE: Hemoglobin: 14 g/dL (ref 12.0–15.0)

## 2014-02-02 SURGERY — CARPAL TUNNEL RELEASE
Anesthesia: Monitor Anesthesia Care | Site: Wrist | Laterality: Right

## 2014-02-02 MED ORDER — MIDAZOLAM HCL 5 MG/5ML IJ SOLN
INTRAMUSCULAR | Status: DC | PRN
Start: 2014-02-02 — End: 2014-02-02
  Administered 2014-02-02: 1 mg via INTRAVENOUS

## 2014-02-02 MED ORDER — PROPOFOL INFUSION 10 MG/ML OPTIME
INTRAVENOUS | Status: DC | PRN
  Administered 2014-02-02: 75 ug/kg/min via INTRAVENOUS

## 2014-02-02 MED ORDER — CHLORHEXIDINE GLUCONATE 4 % EX LIQD: 60.0000 mL | Freq: Once | CUTANEOUS | Status: DC

## 2014-02-02 MED ORDER — PROPOFOL 10 MG/ML IV EMUL
INTRAVENOUS | Status: AC
  Filled 2014-02-02: qty 50

## 2014-02-02 MED ORDER — FENTANYL CITRATE 0.05 MG/ML IJ SOLN
50.0000 ug | INTRAMUSCULAR | Status: DC | PRN
Start: 2014-02-02 — End: 2014-02-02

## 2014-02-02 MED ORDER — TRAMADOL HCL 50 MG PO TABS: 50.0000 mg | ORAL_TABLET | Freq: Four times a day (QID) | ORAL | Status: DC | PRN

## 2014-02-02 MED ORDER — ONDANSETRON HCL 4 MG/2ML IJ SOLN: 4.0000 mg | Freq: Once | INTRAMUSCULAR | Status: DC | PRN

## 2014-02-02 MED ORDER — MIDAZOLAM HCL 2 MG/2ML IJ SOLN
INTRAMUSCULAR | Status: AC
  Filled 2014-02-02: qty 2

## 2014-02-02 MED ORDER — CEFAZOLIN SODIUM-DEXTROSE 2-3 GM-% IV SOLR
2.0000 g | INTRAVENOUS | Status: AC
  Administered 2014-02-02: 2 g via INTRAVENOUS

## 2014-02-02 MED ORDER — BUPIVACAINE HCL (PF) 0.25 % IJ SOLN
INTRAMUSCULAR | Status: DC | PRN
  Administered 2014-02-02: 6 mL

## 2014-02-02 MED ORDER — LIDOCAINE HCL (CARDIAC) 20 MG/ML IV SOLN
INTRAVENOUS | Status: DC | PRN
  Administered 2014-02-02: 50 mg via INTRAVENOUS

## 2014-02-02 MED ORDER — FENTANYL CITRATE 0.05 MG/ML IJ SOLN: 25.0000 ug | INTRAMUSCULAR | Status: DC | PRN

## 2014-02-02 MED ORDER — LACTATED RINGERS IV SOLN
INTRAVENOUS | Status: DC
  Administered 2014-02-02: 09:00:00 via INTRAVENOUS
  Administered 2014-02-02: 10 mL/h via INTRAVENOUS

## 2014-02-02 MED ORDER — CEFAZOLIN SODIUM-DEXTROSE 2-3 GM-% IV SOLR: 2.0000 g | INTRAVENOUS | Status: DC

## 2014-02-02 MED ORDER — FENTANYL CITRATE 0.05 MG/ML IJ SOLN
INTRAMUSCULAR | Status: AC
  Filled 2014-02-02: qty 2

## 2014-02-02 MED ORDER — FENTANYL CITRATE 0.05 MG/ML IJ SOLN
INTRAMUSCULAR | Status: DC | PRN
  Administered 2014-02-02: 50 ug via INTRAVENOUS

## 2014-02-02 MED ORDER — MIDAZOLAM HCL 2 MG/2ML IJ SOLN: 1.0000 mg | INTRAMUSCULAR | Status: DC | PRN

## 2014-02-02 SURGICAL SUPPLY — 31 items
BLADE SURG 15 STRL LF DISP TIS (BLADE) ×1 IMPLANT
BLADE SURG 15 STRL SS (BLADE) ×1
BNDG COHESIVE 3X5 TAN STRL LF (GAUZE/BANDAGES/DRESSINGS) ×2 IMPLANT
BNDG ESMARK 4X9 LF (GAUZE/BANDAGES/DRESSINGS) IMPLANT
BNDG GAUZE ELAST 4 BULKY (GAUZE/BANDAGES/DRESSINGS) ×2 IMPLANT
CHLORAPREP W/TINT 26ML (MISCELLANEOUS) ×2 IMPLANT
CORDS BIPOLAR (ELECTRODE) ×2 IMPLANT
COVER BACK TABLE 60X90IN (DRAPES) ×2 IMPLANT
COVER MAYO STAND STRL (DRAPES) ×2 IMPLANT
CUFF TOURNIQUET SINGLE 18IN (TOURNIQUET CUFF) ×2 IMPLANT
DRAPE EXTREMITY T 121X128X90 (DRAPE) ×2 IMPLANT
DRAPE SURG 17X23 STRL (DRAPES) ×2 IMPLANT
DRSG PAD ABDOMINAL 8X10 ST (GAUZE/BANDAGES/DRESSINGS) ×2 IMPLANT
GAUZE SPONGE 4X4 12PLY STRL (GAUZE/BANDAGES/DRESSINGS) ×2 IMPLANT
GAUZE XEROFORM 1X8 LF (GAUZE/BANDAGES/DRESSINGS) ×2 IMPLANT
GLOVE BIOGEL PI IND STRL 8.5 (GLOVE) ×1 IMPLANT
GLOVE BIOGEL PI INDICATOR 8.5 (GLOVE) ×1
GLOVE SURG ORTHO 8.0 STRL STRW (GLOVE) ×2 IMPLANT
GOWN STRL REUS W/ TWL LRG LVL3 (GOWN DISPOSABLE) ×1 IMPLANT
GOWN STRL REUS W/TWL LRG LVL3 (GOWN DISPOSABLE) ×1
GOWN STRL REUS W/TWL XL LVL3 (GOWN DISPOSABLE) ×2 IMPLANT
NEEDLE PRECISIONGLIDE 27X1.5 (NEEDLE) ×2 IMPLANT
NS IRRIG 1000ML POUR BTL (IV SOLUTION) ×2 IMPLANT
PACK BASIN DAY SURGERY FS (CUSTOM PROCEDURE TRAY) ×2 IMPLANT
STOCKINETTE 4X48 STRL (DRAPES) ×2 IMPLANT
SUT MNCRL AB 4-0 PS2 18 (SUTURE) ×2 IMPLANT
SUT VICRYL 4-0 PS2 18IN ABS (SUTURE) IMPLANT
SYR BULB 3OZ (MISCELLANEOUS) ×2 IMPLANT
SYR CONTROL 10ML LL (SYRINGE) ×2 IMPLANT
TOWEL OR 17X24 6PK STRL BLUE (TOWEL DISPOSABLE) ×2 IMPLANT
UNDERPAD 30X30 INCONTINENT (UNDERPADS AND DIAPERS) ×2 IMPLANT
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
## 2014-02-02 NOTE — Discharge Instructions (Addendum)

## 2014-02-02 NOTE — Brief Op Note (Signed)
02/02/2014  9:59 AM  PATIENT:  Kimberly Escobar  59 y.o. female  PRE-OPERATIVE DIAGNOSIS:  right carpal tunnel syndrome   POST-OPERATIVE DIAGNOSIS:  right carpal tunnel syndrome   PROCEDURE:  Procedure(s): RIGHT CARPAL TUNNEL RELEASE (Right)  SURGEON:  Surgeon(s) and Role:    * Daryll Brod, MD - Primary  PHYSICIAN ASSISTANT:   ASSISTANTS: none   ANESTHESIA:   local and regional  EBL:     BLOOD ADMINISTERED:none  DRAINS: none   LOCAL MEDICATIONS USED:  BUPIVICAINE   SPECIMEN:  No Specimen  DISPOSITION OF SPECIMEN:  N/A  COUNTS:  YES  TOURNIQUET:   Total Tourniquet Time Documented: area (laterality) - 23 minutes Total: area (laterality) - 23 minutes   DICTATION: .Other Dictation: Dictation Number [id number]  PLAN OF CARE: Discharge to home after PACU  PATIENT DISPOSITION:  PACU - hemodynamically stable.

## 2014-02-02 NOTE — Anesthesia Preprocedure Evaluation (Signed)
Anesthesia Evaluation  Patient identified by MRN, date of birth, ID band Patient awake    Reviewed: Allergy & Precautions, NPO status , Patient's Chart, lab work & pertinent test results  Airway Mallampati: I  TM Distance: >3 FB Neck ROM: Full    Dental  (+) Teeth Intact   Pulmonary  breath sounds clear to auscultation        Cardiovascular Rhythm:Regular Rate:Normal     Neuro/Psych    GI/Hepatic GERD-  Medicated and Controlled,  Endo/Other    Renal/GU      Musculoskeletal   Abdominal   Peds  Hematology   Anesthesia Other Findings   Reproductive/Obstetrics                             Anesthesia Physical Anesthesia Plan  ASA: II  Anesthesia Plan: MAC and Bier Block   Post-op Pain Management:    Induction: Intravenous  Airway Management Planned: Simple Face Mask  Additional Equipment:   Intra-op Plan:   Post-operative Plan:   Informed Consent: I have reviewed the patients History and Physical, chart, labs and discussed the procedure including the risks, benefits and alternatives for the proposed anesthesia with the patient or authorized representative who has indicated his/her understanding and acceptance.     Plan Discussed with: CRNA, Anesthesiologist and Surgeon  Anesthesia Plan Comments:         Anesthesia Quick Evaluation

## 2014-02-02 NOTE — Op Note (Signed)
Dictation Number [id number]

## 2014-02-02 NOTE — Anesthesia Postprocedure Evaluation (Signed)
  Anesthesia Post-op Note  Patient: Kimberly Escobar  Procedure(s) Performed: Procedure(s): RIGHT CARPAL TUNNEL RELEASE (Right)  Patient Location: PACU  Anesthesia Type: MAC, Bier Block   Level of Consciousness: awake, alert  and oriented  Airway and Oxygen Therapy: Patient Spontanous Breathing  Post-op Pain: none  Post-op Assessment: Post-op Vital signs reviewed  Post-op Vital Signs: Reviewed  Last Vitals:  Filed Vitals:   02/02/14 1130  BP: 125/95  Pulse: 74  Temp: 36.8 C  Resp: 16    Complications: No apparent anesthesia complications

## 2014-02-02 NOTE — Op Note (Signed)
NAMEKaren Escobar NO.:  [id number]  MEDICAL RECORD NO.:  [id number]  LOCATION:                                 FACILITY:  PHYSICIAN:  Daryll Brod, M.D.            DATE OF BIRTH:  DATE OF PROCEDURE:  02/02/2014 DATE OF DISCHARGE:                              OPERATIVE REPORT   PREOPERATIVE DIAGNOSIS:  Carpal tunnel syndrome, right hand.  POSTOPERATIVE DIAGNOSIS:  Carpal tunnel syndrome, right hand.  OPERATION:  Decompression, right median nerve.  SURGEON:  Daryll Brod, MD.  ANESTHESIA:  Forearm IV regional with local infiltration.  ANESTHESIOLOGIST:  Crew[facility].  HISTORY:  The patient is a 59 year old female with a history of carpal tunnel syndrome, nerve conduction is positive.  This has not responded to conservative treatment.  She has elected to undergo surgical decompression to the median nerve.  Pre, peri, and postoperative course have been discussed along with risks and complications.  She is aware that there is no guarantee with the surgery, possibility of infection; recurrence of injury to arteries, nerves, tendons; incomplete relief of symptoms, dystrophy.  In preoperative area, the patient is seen, the extremity marked by both patient and surgeon.  Antibiotic given.  PROCEDURE IN DETAIL:  The patient was brought to the operating room, where a forearm-based IV regional anesthetic was carried out without difficulty.  She was prepped using ChloraPrep, supine position with the right arm free.  A 3-minute dry time was allowed.  Time-out taken, confirming the patient and procedure.  A longitudinal incision was made in the right palm, carried down through subcutaneous tissue.  Bleeders were electrocauterized.  Palmar fascia was split.  Superficial palmar arch identified.  The flexor tendon to the ring and little finger identified to the ulnar side of the median nerve.  The carpal retinaculum was incised with sharp dissection.  Right angle and  Sewall retractor were placed between skin and forearm fascia.  The fascia was released for approximately 2 cm proximal to the wrist crease under direct vision.  The canal was explored.  Air compression to the nerve was apparent.  Motor branch entered into muscle.  No further lesions were identified.  The wound was irrigated with saline and closed with a subcuticular 4-0 Monocryl suture.  A local infiltration with 0.25% bupivacaine without epinephrine was given, approximately 8 mL was used. Sterile compressive dressing with the fingers free was applied.  On deflation of the tourniquet, all fingers immediately pinked.  She was taken to the recovery room for observation in satisfactory condition.  She will be discharged home to return to the [facility] in 1 week on tramadol.          ______________________________ Daryll Brod, M.D.     GK/MEDQ  D:  02/02/2014  T:  02/02/2014  Job:  [id number]

## 2014-02-02 NOTE — Transfer of Care (Signed)
Immediate Anesthesia Transfer of Care Note  Patient: Kimberly Escobar  Procedure(s) Performed: Procedure(s): RIGHT CARPAL TUNNEL RELEASE (Right)  Patient Location: PACU  Anesthesia Type:Bier block  Level of Consciousness: awake, sedated and patient cooperative  Airway & Oxygen Therapy: Patient Spontanous Breathing and Patient connected to face mask oxygen  Post-op Assessment: Report given to PACU RN and Post -op Vital signs reviewed and stable  Post vital signs: Reviewed and stable  Complications: No apparent anesthesia complications

## 2014-02-03 ENCOUNTER — Encounter (HOSPITAL_BASED_OUTPATIENT_CLINIC_OR_DEPARTMENT_OTHER): Payer: Self-pay | Admitting: Orthopedic Surgery

## 2014-02-06 ENCOUNTER — Telehealth: Payer: Self-pay | Admitting: Family Medicine

## 2014-02-06 MED ORDER — LEVOCETIRIZINE DIHYDROCHLORIDE 5 MG PO TABS: 5.0000 mg | ORAL_TABLET | Freq: Every evening | ORAL | Status: DC

## 2014-02-06 NOTE — Telephone Encounter (Signed)
Spoke with patient and she said she still has the fullness in her ears and and the stuff in her throat. She said she is still on everything but the symptoms have not improved. No fever, no discolored phlegm, no cough. She said she has a constant drip, but no sore throat. Please advise      KP

## 2014-02-06 NOTE — Telephone Encounter (Signed)
Change claritin to xyzal 5 mg 1 po qd #30  2 refills

## 2014-02-06 NOTE — Telephone Encounter (Signed)
Caller name:Kanda Grant Relationship to patient:self Can be [phone] Pharmacy:[facility] in chart  Reason for call:PT states that still not feeling well/ believes that the meds are not working- will come in if need be was seen last week

## 2014-02-06 NOTE — Telephone Encounter (Signed)
Patient has bee made aware and she agreed to switch to Xyzal. Rx faxed and she will call if she has concerns.      KP

## 2014-02-07 ENCOUNTER — Ambulatory Visit (INDEPENDENT_AMBULATORY_CARE_PROVIDER_SITE_OTHER): Payer: Medicare Other | Admitting: Family Medicine

## 2014-02-07 ENCOUNTER — Encounter: Payer: Self-pay | Admitting: Family Medicine

## 2014-02-07 DIAGNOSIS — H60391 Other infective otitis externa, right ear: Secondary | ICD-10-CM

## 2014-02-07 MED ORDER — OFLOXACIN 0.3 % OT SOLN: 10.0000 [drp] | Freq: Every day | OTIC | Status: DC

## 2014-02-07 NOTE — Telephone Encounter (Signed)
We should see ears again before drops rx ----esp if pain worse--to make sure no rupture

## 2014-02-07 NOTE — Telephone Encounter (Signed)
Patient states that she is now having ear pain and would like drops called in.  [facility]

## 2014-02-07 NOTE — Telephone Encounter (Signed)
Please advise      KP 

## 2014-02-07 NOTE — Progress Notes (Signed)
   Subjective:    Patient ID: Kimberly Escobar, female    DOB: 1955-04-16, 59 y.o.   MRN: [id number]  HPI  No ov-- just rx  Review of Systems     Objective:   Physical Exam        Assessment & Plan:

## 2014-02-07 NOTE — Telephone Encounter (Signed)
Spoke with patient and the right ear is painful and it started hurting last night. Per Dr.Lowne have the patient come in today to recheck ear. Patient will be here today at 2:45. Would you please squeeze her in. Thanks    KP

## 2014-02-09 ENCOUNTER — Ambulatory Visit: Payer: Medicare Other | Admitting: Family Medicine

## 2014-02-14 ENCOUNTER — Other Ambulatory Visit: Payer: Self-pay | Admitting: Family Medicine

## 2014-02-14 ENCOUNTER — Telehealth: Payer: Self-pay | Admitting: Family Medicine

## 2014-02-14 DIAGNOSIS — H60399 Other infective otitis externa, unspecified ear: Secondary | ICD-10-CM

## 2014-02-14 MED ORDER — NEOMYCIN-POLYMYXIN-HC 1 % OT SOLN: 3.0000 [drp] | Freq: Four times a day (QID) | OTIC | Status: DC

## 2014-02-14 NOTE — Telephone Encounter (Signed)
Drops called in

## 2014-02-14 NOTE — Telephone Encounter (Signed)
Caller name: kong from [facility]  Relation to pt: Call back number: [phone] Pharmacy:  Reason for call:   Ear drops are on back order and wants to know what else Dr. Etter Sjogren wants to prescribe

## 2014-02-14 NOTE — Telephone Encounter (Signed)
Please advise      KP 

## 2014-05-08 ENCOUNTER — Telehealth: Payer: Self-pay | Admitting: Family Medicine

## 2014-05-08 NOTE — Telephone Encounter (Signed)
previsit letter for annual exam mailed  °

## 2014-05-26 ENCOUNTER — Encounter: Payer: Self-pay | Admitting: *Deleted

## 2014-05-26 ENCOUNTER — Telehealth: Payer: Self-pay | Admitting: *Deleted

## 2014-05-26 NOTE — Telephone Encounter (Signed)
Pre-Visit Call completed with patient and chart updated.   Pre-Visit Info documented in Specialty Comments under [employer].    

## 2014-05-29 ENCOUNTER — Ambulatory Visit (INDEPENDENT_AMBULATORY_CARE_PROVIDER_SITE_OTHER): Payer: Medicare Other | Admitting: Family Medicine

## 2014-05-29 ENCOUNTER — Other Ambulatory Visit (HOSPITAL_COMMUNITY)
Admission: RE | Admit: 2014-05-29 | Discharge: 2014-05-29 | Disposition: A | Discharge status: Home / Self Care | Payer: Medicare Other | Source: Ambulatory Visit | Attending: Family Medicine | Admitting: Family Medicine

## 2014-05-29 ENCOUNTER — Encounter: Payer: Self-pay | Admitting: Family Medicine

## 2014-05-29 VITALS — BP 108/68 | HR 66 | Temp 98.0°F | Ht 65.0 in | Wt 172.2 lb

## 2014-05-29 DIAGNOSIS — Z79899 Other long term (current) drug therapy: Secondary | ICD-10-CM

## 2014-05-29 DIAGNOSIS — Z1231 Encounter for screening mammogram for malignant neoplasm of breast: Secondary | ICD-10-CM

## 2014-05-29 DIAGNOSIS — Z23 Encounter for immunization: Secondary | ICD-10-CM | POA: Diagnosis not present

## 2014-05-29 DIAGNOSIS — Z01419 Encounter for gynecological examination (general) (routine) without abnormal findings: Secondary | ICD-10-CM | POA: Insufficient documentation

## 2014-05-29 DIAGNOSIS — Z0000 Encounter for general adult medical examination without abnormal findings: Secondary | ICD-10-CM

## 2014-05-29 DIAGNOSIS — B009 Herpesviral infection, unspecified: Secondary | ICD-10-CM | POA: Diagnosis not present

## 2014-05-29 DIAGNOSIS — Z136 Encounter for screening for cardiovascular disorders: Secondary | ICD-10-CM

## 2014-05-29 LAB — HEPATIC FUNCTION PANEL
ALBUMIN: 4.2 g/dL (ref 3.5–5.2)
ALK PHOS: 74 U/L (ref 39–117)
ALT: 24 U/L (ref 0–35)
AST: 17 U/L (ref 0–37)
BILIRUBIN DIRECT: 0.1 mg/dL (ref 0.0–0.3)
TOTAL PROTEIN: 7.9 g/dL (ref 6.0–8.3)
Total Bilirubin: 0.4 mg/dL (ref 0.2–1.2)

## 2014-05-29 LAB — CBC WITH DIFFERENTIAL/PLATELET
Basophils Absolute: 0 10*3/uL (ref 0.0–0.1)
Basophils Relative: 0.4 % (ref 0.0–3.0)
EOS PCT: 1.3 % (ref 0.0–5.0)
Eosinophils Absolute: 0.1 10*3/uL (ref 0.0–0.7)
HCT: 40 % (ref 36.0–46.0)
Hemoglobin: 13.5 g/dL (ref 12.0–15.0)
Lymphocytes Relative: 39.5 % (ref 12.0–46.0)
Lymphs Abs: 2.1 10*3/uL (ref 0.7–4.0)
MCHC: 33.9 g/dL (ref 30.0–36.0)
MCV: 93.3 fl (ref 78.0–100.0)
Monocytes Absolute: 0.3 10*3/uL (ref 0.1–1.0)
Monocytes Relative: 6.5 % (ref 3.0–12.0)
NEUTROS PCT: 52.3 % (ref 43.0–77.0)
Neutro Abs: 2.8 10*3/uL (ref 1.4–7.7)
Platelets: 276 10*3/uL (ref 150.0–400.0)
RBC: 4.28 Mil/uL (ref 3.87–5.11)
RDW: 13.2 % (ref 11.5–15.5)
WBC: 5.3 10*3/uL (ref 4.0–10.5)

## 2014-05-29 LAB — BASIC METABOLIC PANEL
BUN: 14 mg/dL (ref 6–23)
CHLORIDE: 103 meq/L (ref 96–112)
CO2: 31 meq/L (ref 19–32)
Calcium: 9.9 mg/dL (ref 8.4–10.5)
Creatinine, Ser: 0.74 mg/dL (ref 0.40–1.20)
GFR: 102.71 mL/min (ref >60.00)
GLUCOSE: 86 mg/dL (ref 70–99)
POTASSIUM: 3.9 meq/L (ref 3.5–5.1)
SODIUM: 137 meq/L (ref 135–145)

## 2014-05-29 LAB — POCT URINALYSIS DIPSTICK
Bilirubin, UA: NEGATIVE
Blood, UA: NEGATIVE
GLUCOSE UA: NEGATIVE
Ketones, UA: NEGATIVE
Leukocytes, UA: NEGATIVE
NITRITE UA: NEGATIVE
Protein, UA: NEGATIVE
Spec Grav, UA: 1.02
Urobilinogen, UA: 0.2
pH, UA: 6

## 2014-05-29 LAB — LIPID PANEL
CHOLESTEROL: 201 mg/dL — AB (ref 0–200)
HDL: 60 mg/dL (ref >39.00)
LDL Cholesterol: 124 mg/dL — ABNORMAL HIGH (ref 0–99)
NonHDL: 141
TRIGLYCERIDES: 85 mg/dL (ref 0.0–149.0)
Total CHOL/HDL Ratio: 3
VLDL: 17 mg/dL (ref 0.0–40.0)

## 2014-05-29 MED ORDER — ZOSTER VACCINE LIVE 19400 UNT/0.65ML ~~LOC~~ SOLR: 0.6500 mL | Freq: Once | SUBCUTANEOUS | Status: DC

## 2014-05-29 MED ORDER — ACYCLOVIR 400 MG PO TABS: 400.0000 mg | ORAL_TABLET | Freq: Two times a day (BID) | ORAL | Status: DC

## 2014-05-29 NOTE — Patient Instructions (Addendum)
Your mammogram is scheduled for June 7th at 10:15. Please arrive at 10 o'clock and do not use any powder, deodorant or lotion prior to your appointment. Address: [address], [place], [place] [address]  [phone] Preventive Care for Adults A healthy lifestyle and preventive care can promote health and wellness. Preventive health guidelines for women include the following key practices.  A routine yearly physical is a good way to check with your health care provider about your health and preventive screening. It is a chance to share any concerns and updates on your health and to receive a thorough exam.  Visit your dentist for a routine exam and preventive care every 6 months. Brush your teeth twice a day and floss once a day. Good oral hygiene prevents tooth decay and gum disease.  The frequency of eye exams is based on your age, health, family medical history, use of contact lenses, and other factors. Follow your health care provider's recommendations for frequency of eye exams.  Eat a healthy diet. Foods like vegetables, fruits, whole grains, low-fat dairy products, and lean protein foods contain the nutrients you need without too many calories. Decrease your intake of foods high in solid fats, added sugars, and salt. Eat the right amount of calories for you.Get information about a proper diet from your health care provider, if necessary.  Regular physical exercise is one of the most important things you can do for your health. Most adults should get at least 150 minutes of moderate-intensity exercise (any activity that increases your heart rate and causes you to sweat) each week. In addition, most adults need muscle-strengthening exercises on 2 or more days a week.  Maintain a healthy weight. The body mass index (BMI) is a screening tool to identify possible weight problems. It provides an estimate of body fat based on height and weight. Your health care provider can find your BMI and  can help you achieve or maintain a healthy weight.For adults 20 years and older:  A BMI below 18.5 is considered underweight.  A BMI of 18.5 to 24.9 is normal.  A BMI of 25 to 29.9 is considered overweight.  A BMI of 30 and above is considered obese.  Maintain normal blood lipids and cholesterol levels by exercising and minimizing your intake of saturated fat. Eat a balanced diet with plenty of fruit and vegetables. Blood tests for lipids and cholesterol should begin at age 75 and be repeated every 5 years. If your lipid or cholesterol levels are high, you are over 50, or you are at high risk for heart disease, you may need your cholesterol levels checked more frequently.Ongoing high lipid and cholesterol levels should be treated with medicines if diet and exercise are not working.  If you smoke, find out from your health care provider how to quit. If you do not use tobacco, do not start.  Lung cancer screening is recommended for adults aged 42-80 years who are at high risk for developing lung cancer because of a history of smoking. A yearly low-dose CT scan of the lungs is recommended for people who have at least a 30-pack-year history of smoking and are a current smoker or have quit within the past 15 years. A pack year of smoking is smoking an average of 1 pack of cigarettes a day for 1 year (for example: 1 pack a day for 30 years or 2 packs a day for 15 years). Yearly screening should continue until the smoker has stopped smoking for  at least 15 years. Yearly screening should be stopped for people who develop a health problem that would prevent them from having lung cancer treatment.  If you are pregnant, do not drink alcohol. If you are breastfeeding, be very cautious about drinking alcohol. If you are not pregnant and choose to drink alcohol, do not have more than 1 drink per day. One drink is considered to be 12 ounces (355 mL) of beer, 5 ounces (148 mL) of wine, or 1.5 ounces (44 mL) of  liquor.  Avoid use of street drugs. Do not share needles with anyone. Ask for help if you need support or instructions about stopping the use of drugs.  High blood pressure causes heart disease and increases the risk of stroke. Your blood pressure should be checked at least every 1 to 2 years. Ongoing high blood pressure should be treated with medicines if weight loss and exercise do not work.  If you are 63-79 years old, ask your health care provider if you should take aspirin to prevent strokes.  Diabetes screening involves taking a blood sample to check your fasting blood sugar level. This should be done once every 3 years, after age 82, if you are within normal weight and without risk factors for diabetes. Testing should be considered at a younger age or be carried out more frequently if you are overweight and have at least 1 risk factor for diabetes.  Breast cancer screening is essential preventive care for women. You should practice "breast self-awareness." This means understanding the normal appearance and feel of your breasts and may include breast self-examination. Any changes detected, no matter how small, should be reported to a health care provider. Women in their 42s and 30s should have a clinical breast exam (CBE) by a health care provider as part of a regular health exam every 1 to 3 years. After age 65, women should have a CBE every year. Starting at age 13, women should consider having a mammogram (breast X-ray test) every year. Women who have a family history of breast cancer should talk to their health care provider about genetic screening. Women at a high risk of breast cancer should talk to their health care providers about having an MRI and a mammogram every year.  Breast cancer gene (BRCA)-related cancer risk assessment is recommended for women who have family members with BRCA-related cancers. BRCA-related cancers include breast, ovarian, tubal, and peritoneal cancers. Having  family members with these cancers may be associated with an increased risk for harmful changes (mutations) in the breast cancer genes BRCA1 and BRCA2. Results of the assessment will determine the need for genetic counseling and BRCA1 and BRCA2 testing.  Routine pelvic exams to screen for cancer are no longer recommended for nonpregnant women who are considered low risk for cancer of the pelvic organs (ovaries, uterus, and vagina) and who do not have symptoms. Ask your health care provider if a screening pelvic exam is right for you.  If you have had past treatment for cervical cancer or a condition that could lead to cancer, you need Pap tests and screening for cancer for at least 20 years after your treatment. If Pap tests have been discontinued, your risk factors (such as having a new sexual partner) need to be reassessed to determine if screening should be resumed. Some women have medical problems that increase the chance of getting cervical cancer. In these cases, your health care provider may recommend more frequent screening and Pap tests.  The HPV  test is an additional test that may be used for cervical cancer screening. The HPV test looks for the virus that can cause the cell changes on the cervix. The cells collected during the Pap test can be tested for HPV. The HPV test could be used to screen women aged 13 years and older, and should be used in women of any age who have unclear Pap test results. After the age of 54, women should have HPV testing at the same frequency as a Pap test.  Colorectal cancer can be detected and often prevented. Most routine colorectal cancer screening begins at the age of 46 years and continues through age 28 years. However, your health care provider may recommend screening at an earlier age if you have risk factors for colon cancer. On a yearly basis, your health care provider may provide home test kits to check for hidden blood in the stool. Use of a small camera at  the end of a tube, to directly examine the colon (sigmoidoscopy or colonoscopy), can detect the earliest forms of colorectal cancer. Talk to your health care provider about this at age 76, when routine screening begins. Direct exam of the colon should be repeated every 5-10 years through age 6 years, unless early forms of pre-cancerous polyps or small growths are found.  People who are at an increased risk for hepatitis B should be screened for this virus. You are considered at high risk for hepatitis B if:  You were born in a country where hepatitis B occurs often. Talk with your health care provider about which countries are considered high risk.  Your parents were born in a high-risk country and you have not received a shot to protect against hepatitis B (hepatitis B vaccine).  You have HIV or AIDS.  You use needles to inject street drugs.  You live with, or have sex with, someone who has hepatitis B.  You get hemodialysis treatment.  You take certain medicines for conditions like cancer, organ transplantation, and autoimmune conditions.  Hepatitis C blood testing is recommended for all people born from [birth year] through 1967 and any individual with known risks for hepatitis C.  Practice safe sex. Use condoms and avoid high-risk sexual practices to reduce the spread of sexually transmitted infections (STIs). STIs include gonorrhea, chlamydia, syphilis, trichomonas, herpes, HPV, and human immunodeficiency virus (HIV). Herpes, HIV, and HPV are viral illnesses that have no cure. They can result in disability, cancer, and death.  You should be screened for sexually transmitted illnesses (STIs) including gonorrhea and chlamydia if:  You are sexually active and are younger than 24 years.  You are older than 24 years and your health care provider tells you that you are at risk for this type of infection.  Your sexual activity has changed since you were last screened and you are at an increased  risk for chlamydia or gonorrhea. Ask your health care provider if you are at risk.  If you are at risk of being infected with HIV, it is recommended that you take a prescription medicine daily to prevent HIV infection. This is called preexposure prophylaxis (PrEP). You are considered at risk if:  You are a heterosexual woman, are sexually active, and are at increased risk for HIV infection.  You take drugs by injection.  You are sexually active with a partner who has HIV.  Talk with your health care provider about whether you are at high risk of being infected with HIV. If you choose to  begin PrEP, you should first be tested for HIV. You should then be tested every 3 months for as long as you are taking PrEP.  Osteoporosis is a disease in which the bones lose minerals and strength with aging. This can result in serious bone fractures or breaks. The risk of osteoporosis can be identified using a bone density scan. Women ages 70 years and over and women at risk for fractures or osteoporosis should discuss screening with their health care providers. Ask your health care provider whether you should take a calcium supplement or vitamin D to reduce the rate of osteoporosis.  Menopause can be associated with physical symptoms and risks. Hormone replacement therapy is available to decrease symptoms and risks. You should talk to your health care provider about whether hormone replacement therapy is right for you.  Use sunscreen. Apply sunscreen liberally and repeatedly throughout the day. You should seek shade when your shadow is shorter than you. Protect yourself by wearing long sleeves, pants, a wide-brimmed hat, and sunglasses year round, whenever you are outdoors.  Once a month, do a whole body skin exam, using a mirror to look at the skin on your back. Tell your health care provider of new moles, moles that have irregular borders, moles that are larger than a pencil eraser, or moles that have changed  in shape or color.  Stay current with required vaccines (immunizations).  Influenza vaccine. All adults should be immunized every year.  Tetanus, diphtheria, and acellular pertussis (Td, Tdap) vaccine. Pregnant women should receive 1 dose of Tdap vaccine during each pregnancy. The dose should be obtained regardless of the length of time since the last dose. Immunization is preferred during the 27th-36th week of gestation. An adult who has not previously received Tdap or who does not know her vaccine status should receive 1 dose of Tdap. This initial dose should be followed by tetanus and diphtheria toxoids (Td) booster doses every 10 years. Adults with an unknown or incomplete history of completing a 3-dose immunization series with Td-containing vaccines should begin or complete a primary immunization series including a Tdap dose. Adults should receive a Td booster every 10 years.  Varicella vaccine. An adult without evidence of immunity to varicella should receive 2 doses or a second dose if she has previously received 1 dose. Pregnant females who do not have evidence of immunity should receive the first dose after pregnancy. This first dose should be obtained before leaving the health care facility. The second dose should be obtained 4-8 weeks after the first dose.  Human papillomavirus (HPV) vaccine. Females aged 13-26 years who have not received the vaccine previously should obtain the 3-dose series. The vaccine is not recommended for use in pregnant females. However, pregnancy testing is not needed before receiving a dose. If a female is found to be pregnant after receiving a dose, no treatment is needed. In that case, the remaining doses should be delayed until after the pregnancy. Immunization is recommended for any person with an immunocompromised condition through the age of 48 years if she did not get any or all doses earlier. During the 3-dose series, the second dose should be obtained 4-8 weeks  after the first dose. The third dose should be obtained 24 weeks after the first dose and 16 weeks after the second dose.  Zoster vaccine. One dose is recommended for adults aged 62 years or older unless certain conditions are present.  Measles, mumps, and rubella (MMR) vaccine. Adults born before [birth year] generally  are considered immune to measles and mumps. Adults born in [birth year] or later should have 1 or more doses of MMR vaccine unless there is a contraindication to the vaccine or there is laboratory evidence of immunity to each of the three diseases. A routine second dose of MMR vaccine should be obtained at least 28 days after the first dose for students attending postsecondary schools, health care workers, or international travelers. People who received inactivated measles vaccine or an unknown type of measles vaccine during 1963-1967 should receive 2 doses of MMR vaccine. People who received inactivated mumps vaccine or an unknown type of mumps vaccine before 1979 and are at high risk for mumps infection should consider immunization with 2 doses of MMR vaccine. For females of childbearing age, rubella immunity should be determined. If there is no evidence of immunity, females who are not pregnant should be vaccinated. If there is no evidence of immunity, females who are pregnant should delay immunization until after pregnancy. Unvaccinated health care workers born before [birth year] who lack laboratory evidence of measles, mumps, or rubella immunity or laboratory confirmation of disease should consider measles and mumps immunization with 2 doses of MMR vaccine or rubella immunization with 1 dose of MMR vaccine.  Pneumococcal 13-valent conjugate (PCV13) vaccine. When indicated, a person who is uncertain of her immunization history and has no record of immunization should receive the PCV13 vaccine. An adult aged 58 years or older who has certain medical conditions and has not been previously immunized should receive 1  dose of PCV13 vaccine. This PCV13 should be followed with a dose of pneumococcal polysaccharide (PPSV23) vaccine. The PPSV23 vaccine dose should be obtained at least 8 weeks after the dose of PCV13 vaccine. An adult aged 66 years or older who has certain medical conditions and previously received 1 or more doses of PPSV23 vaccine should receive 1 dose of PCV13. The PCV13 vaccine dose should be obtained 1 or more years after the last PPSV23 vaccine dose.  Pneumococcal polysaccharide (PPSV23) vaccine. When PCV13 is also indicated, PCV13 should be obtained first. All adults aged 4 years and older should be immunized. An adult younger than age 79 years who has certain medical conditions should be immunized. Any person who resides in a nursing home or long-term care facility should be immunized. An adult smoker should be immunized. People with an immunocompromised condition and certain other conditions should receive both PCV13 and PPSV23 vaccines. People with human immunodeficiency virus (HIV) infection should be immunized as soon as possible after diagnosis. Immunization during chemotherapy or radiation therapy should be avoided. Routine use of PPSV23 vaccine is not recommended for American Indians, [place] Natives, or people younger than 65 years unless there are medical conditions that require PPSV23 vaccine. When indicated, people who have unknown immunization and have no record of immunization should receive PPSV23 vaccine. One-time revaccination 5 years after the first dose of PPSV23 is recommended for people aged 19-64 years who have chronic kidney failure, nephrotic syndrome, asplenia, or immunocompromised conditions. People who received 1-2 doses of PPSV23 before age 38 years should receive another dose of PPSV23 vaccine at age 26 years or later if at least 5 years have passed since the previous dose. Doses of PPSV23 are not needed for people immunized with PPSV23 at or after age 69 years.  Meningococcal  vaccine. Adults with asplenia or persistent complement component deficiencies should receive 2 doses of quadrivalent meningococcal conjugate (MenACWY-D) vaccine. The doses should be obtained at least 2 months apart. Microbiologists  working with certain meningococcal bacteria, [place] recruits, people at risk during an outbreak, and people who travel to or live in countries with a high rate of meningitis should be immunized. A first-year college student up through age 56 years who is living in a residence hall should receive a dose if she did not receive a dose on or after her 16th birthday. Adults who have certain high-risk conditions should receive one or more doses of vaccine.  Hepatitis A vaccine. Adults who wish to be protected from this disease, have certain high-risk conditions, work with hepatitis A-infected animals, work in hepatitis A research labs, or travel to or work in countries with a high rate of hepatitis A should be immunized. Adults who were previously unvaccinated and who anticipate close contact with an international adoptee during the first 60 days after arrival in the [place] from a country with a high rate of hepatitis A should be immunized.  Hepatitis B vaccine. Adults who wish to be protected from this disease, have certain high-risk conditions, may be exposed to blood or other infectious body fluids, are household contacts or sex partners of hepatitis B positive people, are clients or workers in certain care facilities, or travel to or work in countries with a high rate of hepatitis B should be immunized.  Haemophilus influenzae type b (Hib) vaccine. A previously unvaccinated person with asplenia or sickle cell disease or having a scheduled splenectomy should receive 1 dose of Hib vaccine. Regardless of previous immunization, a recipient of a hematopoietic stem cell transplant should receive a 3-dose series 6-12 months after her successful transplant. Hib vaccine is not  recommended for adults with HIV infection. Preventive Services / Frequency Ages 83 to 16 years  Blood pressure check.** / Every 1 to 2 years.  Lipid and cholesterol check.** / Every 5 years beginning at age 85.  Clinical breast exam.** / Every 3 years for women in their 45s and 45s.  BRCA-related cancer risk assessment.** / For women who have family members with a BRCA-related cancer (breast, ovarian, tubal, or peritoneal cancers).  Pap test.** / Every 2 years from ages 24 through 43. Every 3 years starting at age 26 through age 32 or 41 with a history of 3 consecutive normal Pap tests.  HPV screening.** / Every 3 years from ages 5 through ages 54 to 51 with a history of 3 consecutive normal Pap tests.  Hepatitis C blood test.** / For any individual with known risks for hepatitis C.  Skin self-exam. / Monthly.  Influenza vaccine. / Every year.  Tetanus, diphtheria, and acellular pertussis (Tdap, Td) vaccine.** / Consult your health care provider. Pregnant women should receive 1 dose of Tdap vaccine during each pregnancy. 1 dose of Td every 10 years.  Varicella vaccine.** / Consult your health care provider. Pregnant females who do not have evidence of immunity should receive the first dose after pregnancy.  HPV vaccine. / 3 doses over 6 months, if 67 and younger. The vaccine is not recommended for use in pregnant females. However, pregnancy testing is not needed before receiving a dose.  Measles, mumps, rubella (MMR) vaccine.** / You need at least 1 dose of MMR if you were born in 1957 or later. You may also need a 2nd dose. For females of childbearing age, rubella immunity should be determined. If there is no evidence of immunity, females who are not pregnant should be vaccinated. If there is no evidence of immunity, females who are pregnant should delay immunization  until after pregnancy.  Pneumococcal 13-valent conjugate (PCV13) vaccine.** / Consult your health care  provider.  Pneumococcal polysaccharide (PPSV23) vaccine.** / 1 to 2 doses if you smoke cigarettes or if you have certain conditions.  Meningococcal vaccine.** / 1 dose if you are age 30 to 16 years and a Market researcher living in a residence hall, or have one of several medical conditions, you need to get vaccinated against meningococcal disease. You may also need additional booster doses.  Hepatitis A vaccine.** / Consult your health care provider.  Hepatitis B vaccine.** / Consult your health care provider.  Haemophilus influenzae type b (Hib) vaccine.** / Consult your health care provider. Ages 51 to 71 years  Blood pressure check.** / Every 1 to 2 years.  Lipid and cholesterol check.** / Every 5 years beginning at age 71 years.  Lung cancer screening. / Every year if you are aged 32-80 years and have a 30-pack-year history of smoking and currently smoke or have quit within the past 15 years. Yearly screening is stopped once you have quit smoking for at least 15 years or develop a health problem that would prevent you from having lung cancer treatment.  Clinical breast exam.** / Every year after age 90 years.  BRCA-related cancer risk assessment.** / For women who have family members with a BRCA-related cancer (breast, ovarian, tubal, or peritoneal cancers).  Mammogram.** / Every year beginning at age 36 years and continuing for as long as you are in good health. Consult with your health care provider.  Pap test.** / Every 3 years starting at age 54 years through age 63 or 110 years with a history of 3 consecutive normal Pap tests.  HPV screening.** / Every 3 years from ages 55 years through ages 93 to 69 years with a history of 3 consecutive normal Pap tests.  Fecal occult blood test (FOBT) of stool. / Every year beginning at age 60 years and continuing until age 97 years. You may not need to do this test if you get a colonoscopy every 10 years.  Flexible sigmoidoscopy  or colonoscopy.** / Every 5 years for a flexible sigmoidoscopy or every 10 years for a colonoscopy beginning at age 17 years and continuing until age 53 years.  Hepatitis C blood test.** / For all people born from [birth year] through 1967 and any individual with known risks for hepatitis C.  Skin self-exam. / Monthly.  Influenza vaccine. / Every year.  Tetanus, diphtheria, and acellular pertussis (Tdap/Td) vaccine.** / Consult your health care provider. Pregnant women should receive 1 dose of Tdap vaccine during each pregnancy. 1 dose of Td every 10 years.  Varicella vaccine.** / Consult your health care provider. Pregnant females who do not have evidence of immunity should receive the first dose after pregnancy.  Zoster vaccine.** / 1 dose for adults aged 32 years or older.  Measles, mumps, rubella (MMR) vaccine.** / You need at least 1 dose of MMR if you were born in 1959 or later. You may also need a 2nd dose. For females of childbearing age, rubella immunity should be determined. If there is no evidence of immunity, females who are not pregnant should be vaccinated. If there is no evidence of immunity, females who are pregnant should delay immunization until after pregnancy.  Pneumococcal 13-valent conjugate (PCV13) vaccine.** / Consult your health care provider.  Pneumococcal polysaccharide (PPSV23) vaccine.** / 1 to 2 doses if you smoke cigarettes or if you have certain conditions.  Meningococcal vaccine.** / Consult  your health care provider.  Hepatitis A vaccine.** / Consult your health care provider.  Hepatitis B vaccine.** / Consult your health care provider.  Haemophilus influenzae type b (Hib) vaccine.** / Consult your health care provider. Ages 46 years and over  Blood pressure check.** / Every 1 to 2 years.  Lipid and cholesterol check.** / Every 5 years beginning at age 61 years.  Lung cancer screening. / Every year if you are aged 64-80 years and have a 30-pack-year history  of smoking and currently smoke or have quit within the past 15 years. Yearly screening is stopped once you have quit smoking for at least 15 years or develop a health problem that would prevent you from having lung cancer treatment.  Clinical breast exam.** / Every year after age 72 years.  BRCA-related cancer risk assessment.** / For women who have family members with a BRCA-related cancer (breast, ovarian, tubal, or peritoneal cancers).  Mammogram.** / Every year beginning at age 28 years and continuing for as long as you are in good health. Consult with your health care provider.  Pap test.** / Every 3 years starting at age 59 years through age 54 or 37 years with 3 consecutive normal Pap tests. Testing can be stopped between 65 and 70 years with 3 consecutive normal Pap tests and no abnormal Pap or HPV tests in the past 10 years.  HPV screening.** / Every 3 years from ages 21 years through ages 101 or 22 years with a history of 3 consecutive normal Pap tests. Testing can be stopped between 65 and 70 years with 3 consecutive normal Pap tests and no abnormal Pap or HPV tests in the past 10 years.  Fecal occult blood test (FOBT) of stool. / Every year beginning at age 65 years and continuing until age 30 years. You may not need to do this test if you get a colonoscopy every 10 years.  Flexible sigmoidoscopy or colonoscopy.** / Every 5 years for a flexible sigmoidoscopy or every 10 years for a colonoscopy beginning at age 69 years and continuing until age 13 years.  Hepatitis C blood test.** / For all people born from [birth year] through 1967 and any individual with known risks for hepatitis C.  Osteoporosis screening.** / A one-time screening for women ages 73 years and over and women at risk for fractures or osteoporosis.  Skin self-exam. / Monthly.  Influenza vaccine. / Every year.  Tetanus, diphtheria, and acellular pertussis (Tdap/Td) vaccine.** / 1 dose of Td every 10 years.  Varicella  vaccine.** / Consult your health care provider.  Zoster vaccine.** / 1 dose for adults aged 68 years or older.  Pneumococcal 13-valent conjugate (PCV13) vaccine.** / Consult your health care provider.  Pneumococcal polysaccharide (PPSV23) vaccine.** / 1 dose for all adults aged 74 years and older.  Meningococcal vaccine.** / Consult your health care provider.  Hepatitis A vaccine.** / Consult your health care provider.  Hepatitis B vaccine.** / Consult your health care provider.  Haemophilus influenzae type b (Hib) vaccine.** / Consult your health care provider. ** Family history and personal history of risk and conditions may change your health care provider's recommendations. Document Released: 02/25/2001 Document Revised: 05/16/2013 Document Reviewed: 05/27/2010 [facility] Patient Information 2015 [place], [place]. This information is not intended to replace advice given to you by your health care provider. Make sure you discuss any questions you have with your health care provider.

## 2014-05-29 NOTE — Progress Notes (Signed)
Pre visit review using our clinic review tool, if applicable. No additional management support is needed unless otherwise documented below in the visit note. 
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
## 2014-05-30 LAB — CYTOLOGY - PAP

## 2014-06-14 ENCOUNTER — Encounter: Payer: Self-pay | Admitting: Behavioral Health

## 2014-06-15 ENCOUNTER — Other Ambulatory Visit: Payer: Self-pay | Admitting: Family Medicine

## 2014-06-26 ENCOUNTER — Telehealth: Payer: Self-pay | Admitting: Family Medicine

## 2014-06-26 DIAGNOSIS — M25512 Pain in left shoulder: Secondary | ICD-10-CM

## 2014-06-26 MED ORDER — CYCLOBENZAPRINE HCL 10 MG PO TABS: 10.0000 mg | ORAL_TABLET | Freq: Three times a day (TID) | ORAL | Status: DC | PRN

## 2014-06-26 NOTE — Telephone Encounter (Signed)
Rx faxed.    KP 

## 2014-06-26 NOTE — Telephone Encounter (Signed)
Patient declined, she said she is ok with the flexeril. She only takes it when she needs it and periodically has to take and extra dose. She is requesting a refill. Flexeril filled 01/30/14 #30 no refills   Please advise     KP

## 2014-06-26 NOTE — Telephone Encounter (Signed)
Refill 1

## 2014-06-26 NOTE — Telephone Encounter (Signed)
Patient was to increase/double up on the flexeril. Please advise   KP

## 2014-06-26 NOTE — Telephone Encounter (Signed)
Relation to TD:HRCB Call back number: [phone]   Reason for call:  Pt states can she double up on the muscle relaxer due to joint pain.

## 2014-06-26 NOTE — Telephone Encounter (Signed)
No-- she is on the highest dose Can change to soma 350 mg #30  1 po qid prn

## 2014-06-27 DIAGNOSIS — Z1231 Encounter for screening mammogram for malignant neoplasm of breast: Secondary | ICD-10-CM | POA: Diagnosis not present

## 2014-06-29 DIAGNOSIS — Z1211 Encounter for screening for malignant neoplasm of colon: Secondary | ICD-10-CM | POA: Diagnosis not present

## 2014-06-29 DIAGNOSIS — Z1212 Encounter for screening for malignant neoplasm of rectum: Secondary | ICD-10-CM | POA: Diagnosis not present

## 2014-06-29 LAB — COLOGUARD: COLOGUARD: NEGATIVE

## 2014-07-03 ENCOUNTER — Ambulatory Visit: Payer: Medicare Other | Admitting: Medical

## 2014-07-03 ENCOUNTER — Ambulatory Visit: Payer: Medicare Other | Admitting: Physician Assistant

## 2014-07-03 DIAGNOSIS — G5602 Carpal tunnel syndrome, left upper limb: Secondary | ICD-10-CM | POA: Diagnosis not present

## 2014-07-03 DIAGNOSIS — G5601 Carpal tunnel syndrome, right upper limb: Secondary | ICD-10-CM | POA: Diagnosis not present

## 2014-07-05 ENCOUNTER — Ambulatory Visit: Payer: Medicare Other | Admitting: Medical

## 2014-07-10 ENCOUNTER — Encounter: Payer: Self-pay | Admitting: Family Medicine

## 2014-07-27 ENCOUNTER — Ambulatory Visit (INDEPENDENT_AMBULATORY_CARE_PROVIDER_SITE_OTHER): Payer: Medicare Other | Admitting: Family Medicine

## 2014-07-27 ENCOUNTER — Encounter: Payer: Self-pay | Admitting: Family Medicine

## 2014-07-27 VITALS — BP 118/79 | HR 80 | Ht 65.0 in | Wt 171.0 lb

## 2014-07-27 DIAGNOSIS — M79604 Pain in right leg: Secondary | ICD-10-CM

## 2014-07-27 DIAGNOSIS — M79605 Pain in left leg: Secondary | ICD-10-CM | POA: Diagnosis not present

## 2014-07-27 NOTE — Patient Instructions (Signed)
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 Wear the green insoles with scaphoid pads as much as possible. Icing 15 minutes at a time 3-4 times a day as needed. Aleve 2 tabs twice a day with food for pain and inflammation. We will review this and your wrist at follow-up in 1 month.

## 2014-08-01 DIAGNOSIS — M79605 Pain in left leg: Principal | ICD-10-CM

## 2014-08-01 DIAGNOSIS — M79604 Pain in right leg: Secondary | ICD-10-CM | POA: Insufficient documentation

## 2014-08-01 NOTE — Assessment & Plan Note (Signed)
only finding on exam is tenderness at sinus tarsi without prior injury consistent with sinus tarsi syndrome.  Should respond well to sports insoles with scaphoid pads - made these today.  Icing, nsaids as needed.  F/u in 1 month.
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
## 2014-08-05 DIAGNOSIS — H524 Presbyopia: Secondary | ICD-10-CM | POA: Diagnosis not present

## 2014-08-05 DIAGNOSIS — H2513 Age-related nuclear cataract, bilateral: Secondary | ICD-10-CM | POA: Diagnosis not present

## 2014-08-14 ENCOUNTER — Ambulatory Visit (INDEPENDENT_AMBULATORY_CARE_PROVIDER_SITE_OTHER): Payer: Medicare Other | Admitting: Family Medicine

## 2014-08-14 ENCOUNTER — Encounter: Payer: Self-pay | Admitting: Family Medicine

## 2014-08-14 VITALS — BP 120/68 | HR 86 | Temp 98.4°F | Wt 174.4 lb

## 2014-08-14 DIAGNOSIS — H6592 Unspecified nonsuppurative otitis media, left ear: Secondary | ICD-10-CM

## 2014-08-14 MED ORDER — AMOXICILLIN-POT CLAVULANATE 875-125 MG PO TABS: 1.0000 | ORAL_TABLET | Freq: Two times a day (BID) | ORAL | Status: DC

## 2014-08-14 NOTE — Patient Instructions (Signed)
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
## 2014-08-14 NOTE — Progress Notes (Signed)
Pre visit review using our clinic review tool, if applicable. No additional management support is needed unless otherwise documented below in the visit note. 

## 2014-08-15 ENCOUNTER — Ambulatory Visit: Payer: Medicare Other | Admitting: Family Medicine

## 2014-08-17 ENCOUNTER — Encounter: Payer: Self-pay | Admitting: Family Medicine

## 2014-08-28 ENCOUNTER — Ambulatory Visit: Payer: Medicare Other | Admitting: Family Medicine

## 2014-08-29 ENCOUNTER — Encounter: Payer: Self-pay | Admitting: Family Medicine

## 2014-08-29 ENCOUNTER — Ambulatory Visit (INDEPENDENT_AMBULATORY_CARE_PROVIDER_SITE_OTHER): Payer: Medicare Other | Admitting: Family Medicine

## 2014-08-29 VITALS — BP 122/70 | HR 78 | Temp 99.1°F | Wt 173.6 lb

## 2014-08-29 DIAGNOSIS — G43009 Migraine without aura, not intractable, without status migrainosus: Secondary | ICD-10-CM

## 2014-08-29 DIAGNOSIS — J302 Other seasonal allergic rhinitis: Secondary | ICD-10-CM | POA: Diagnosis not present

## 2014-08-29 MED ORDER — ELETRIPTAN HYDROBROMIDE 20 MG PO TABS: 20.0000 mg | ORAL_TABLET | ORAL | Status: DC | PRN

## 2014-08-29 MED ORDER — FLUTICASONE PROPIONATE 50 MCG/ACT NA SUSP: 2.0000 | Freq: Every day | NASAL | Status: DC

## 2014-08-29 MED ORDER — LEVOCETIRIZINE DIHYDROCHLORIDE 5 MG PO TABS: 5.0000 mg | ORAL_TABLET | Freq: Every evening | ORAL | Status: DC

## 2014-08-29 MED ORDER — MOMETASONE FUROATE 50 MCG/ACT NA SUSP: 2.0000 | Freq: Every day | NASAL | Status: DC

## 2014-08-29 MED ORDER — RIZATRIPTAN BENZOATE 10 MG PO TABS: 10.0000 mg | ORAL_TABLET | ORAL | Status: DC | PRN

## 2014-08-29 NOTE — Patient Instructions (Signed)
Eletriptan tablets What is this medicine? ELETRIPTAN (el ih TRIP tan) is used to treat migraines with or without aura. An aura is a strange feeling or visual disturbance that warns you of an attack. It is not used to prevent migraines. This medicine may be used for other purposes; ask your health care provider or pharmacist if you have questions. COMMON BRAND NAME(S): Relpax What should I tell my health care provider before I take this medicine? They need to know if you have any of these conditions: -bowel disease or colitis -diabetes -family history of heart disease -fast or irregular heart beat -heart or blood vessel disease, angina (chest pain), or previous heart attack -high blood pressure -high cholesterol -history of stroke, transient ischemic attacks (TIAs or mini-strokes), or intracranial bleeding -kidney or liver disease -overweight -poor circulation -postmenopausal or surgical removal of uterus and ovaries -Raynaud's disease -seizure disorder -an unusual or allergic reaction to eletriptan, other medicines, foods, dyes, or preservatives -pregnant or trying to get pregnant -breast-feeding How should I use this medicine? Take this medicine by mouth with a glass of water. Follow the directions on the prescription label. This medicine is taken at the first symptoms of a migraine. It is not for everyday use. If your migraine headache returns after one dose, you can take another dose as directed. You must leave at least 2 hours between doses, and do not take more than 40 mg as a single dose. Do not take more than 80 mg total in any 24 hour period. If there is no improvement at all after the first dose, do not take a second dose without talking to your doctor or health care professional. Do not take your medicine more often than directed. Talk to your pediatrician regarding the use of this medicine in children. Special care may be needed. Overdosage: If you think you have taken too much  of this medicine contact a poison control center or emergency room at once. NOTE: This medicine is only for you. Do not share this medicine with others. What if I miss a dose? This does not apply; this medicine is not for regular use. What may interact with this medicine? Do not take this medicine with any of the following medications: -amiodarone -amphetamine, dextroamphetamine, or cocaine -aprepitant -certain antibiotics like clarithromycin, erythromycin, troleandomycin -cimetidine -conivaptan -dalfopristin; quinupristin -dihydroergotamine, ergotamine, ergoloid mesylates, methysergide, or ergot-type medication - do not take within 24 hours of taking eletriptan. -diltiazem -feverfew -imatinib -medicines for fungal infections like fluconazole, itraconazole, ketoconazole, and voriconazole -medicines for HIV, AIDS -medicines for mental depression like fluvoxamine and nefazodone -mifepristone -other migraine medicines like almotriptan, sumatriptan, naratriptan, rizatriptan, zolmitriptan - do not take within 24 hours of taking eletriptan. -tryptophan -verapamil This medicine may also interact with the following medications: -medicines for mental depression, anxiety or mood problems This list may not describe all possible interactions. Give your health care provider a list of all the medicines, herbs, non-prescription drugs, or dietary supplements you use. Also tell them if you smoke, drink alcohol, or use illegal drugs. Some items may interact with your medicine. What should I watch for while using this medicine? Only take this medicine for a migraine headache. Take it if you get warning symptoms or at the start of a migraine attack. It is not for regular use to prevent migraine attacks. You may get drowsy or dizzy. Do not drive, use machinery, or do anything that needs mental alertness until you know how this medicine affects you. To reduce dizzy or  fainting spells, do not sit or stand up  quickly, especially if you are an older patient. Alcohol can increase drowsiness, dizziness and flushing. Avoid alcoholic drinks. Smoking cigarettes may increase the risk of heart-related side effects from using this medicine. If you take migraine medicines for 10 or more days a month, your migraines may get worse. Keep a diary of headache days and medicine use. Contact your healthcare professional if your migraine attacks occur more frequently. What side effects may I notice from receiving this medicine? Side effects that you should report to your doctor or health care professional as soon as possible: -allergic reactions like skin rash, itching or hives, swelling of the face, lips, or tongue -fast, slow, or irregular heart beat -increased or decreased blood pressure -seizures -severe stomach pain and cramping, bloody diarrhea -signs and symptoms of a blood clot such as breathing problems; changes in vision; chest pain; severe, sudden headache; pain, swelling, warmth in the leg; trouble speaking; sudden numbness or weakness of the face, arm or leg -tingling, pain, or numbness in the face, hands, or feet Side effects that usually do not require medical attention (report to your doctor or health care professional if they continue or are bothersome): -drowsiness -feeling warm, flushing, or redness of the face -headache -muscle cramps, pain -nausea, vomiting -unusually weak or tired This list may not describe all possible side effects. Call your doctor for medical advice about side effects. You may report side effects to FDA at 1-800-FDA-1088. Where should I keep my medicine? Keep out of the reach of children. Store at room temperature between 15 and 30 degrees C (59 and 86 degrees F). Throw away any unused medicine after the expiration date. NOTE: This sheet is a summary. It may not cover all possible information. If you have questions about this medicine, talk to your doctor, pharmacist, or  health care provider.  2015, [employer]vier/Gold Standard. (2012-08-31 10:22:32)
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
## 2014-08-29 NOTE — Progress Notes (Signed)
Pre visit review using our clinic review tool, if applicable. No additional management support is needed unless otherwise documented below in the visit note. 

## 2014-08-31 ENCOUNTER — Encounter: Payer: Self-pay | Admitting: Family Medicine

## 2014-08-31 ENCOUNTER — Encounter (INDEPENDENT_AMBULATORY_CARE_PROVIDER_SITE_OTHER): Payer: Self-pay

## 2014-08-31 ENCOUNTER — Ambulatory Visit (INDEPENDENT_AMBULATORY_CARE_PROVIDER_SITE_OTHER): Payer: Medicare Other | Admitting: Family Medicine

## 2014-08-31 VITALS — BP 100/68

## 2014-08-31 DIAGNOSIS — G5601 Carpal tunnel syndrome, right upper limb: Secondary | ICD-10-CM | POA: Diagnosis not present

## 2014-08-31 DIAGNOSIS — M79605 Pain in left leg: Secondary | ICD-10-CM

## 2014-08-31 DIAGNOSIS — M79604 Pain in right leg: Secondary | ICD-10-CM

## 2014-08-31 DIAGNOSIS — M25531 Pain in right wrist: Secondary | ICD-10-CM | POA: Diagnosis not present

## 2014-08-31 NOTE — Patient Instructions (Signed)
You have carpal tunnel syndrome. Wear the wrist brace at nighttime and as often as possible during the day Ibuprofen 600mg  three times a day with food OR aleve 2 tabs twice a day with food for pain and inflammation. A prednisone dose pack is a consideration. Corticosteroid injection is a consideration to help with pain and inflammation  You can order the inserts from [email or web address] - the size and what they are are listed on the packaging.

## 2014-09-04 NOTE — Assessment & Plan Note (Signed)
Discussed options - she would like to start with bracing at night and as often as possible during the day.  Consider prednisone, injection if not improving.
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
## 2014-09-04 NOTE — Assessment & Plan Note (Signed)
much better with inserts - shown how to order sports insoles with scaphoid pads from [facility].

## 2014-10-17 ENCOUNTER — Encounter: Payer: Self-pay | Admitting: Family Medicine

## 2014-10-17 ENCOUNTER — Ambulatory Visit (INDEPENDENT_AMBULATORY_CARE_PROVIDER_SITE_OTHER): Payer: Medicare Other | Admitting: Family Medicine

## 2014-10-17 VITALS — BP 122/86 | HR 87 | Ht 65.0 in | Wt 174.0 lb

## 2014-10-17 DIAGNOSIS — G5601 Carpal tunnel syndrome, right upper limb: Secondary | ICD-10-CM | POA: Diagnosis not present

## 2014-10-17 DIAGNOSIS — M25562 Pain in left knee: Secondary | ICD-10-CM

## 2014-10-17 DIAGNOSIS — M79604 Pain in right leg: Secondary | ICD-10-CM

## 2014-10-17 DIAGNOSIS — M79605 Pain in left leg: Secondary | ICD-10-CM

## 2014-10-17 DIAGNOSIS — M25561 Pain in right knee: Secondary | ICD-10-CM | POA: Diagnosis not present

## 2014-10-17 NOTE — Patient Instructions (Signed)
Your knee and thumb pain is due to arthritis. These are the 4 different classes of medicine you can take for this: Tylenol 500mg  1-2 tabs three times a day for pain. Glucosamine sulfate 750mg  twice a day is a supplement that may help. Capsaicin, aspercreme, or biofreeze topically up to four times a day may also help with pain. Aleve 1-2 tabs twice a day with food Cortisone injections are an option. If cortisone injections do not help, there are different types of shots that may help but they take longer to take effect. It's important that you continue to stay active. Straight leg raises, knee extensions 3 sets of 10 once a day (add ankle weight if these become too easy). Start physical therapy to strengthen muscles around the joint that hurts to take pressure off of the joint itself. Shoe inserts with good arch support may be helpful - continue using these. Walker or cane if needed. Heat or ice 15 minutes at a time 3-4 times a day as needed to help with pain. Water aerobics and cycling with low resistance are the best two types of exercise for arthritis.  You have plantar fasciitis Plantar fascia stretch for 20-30 seconds (do 3 of these) in morning Lowering/raise on a step exercises 3 x 10 once or twice a day - this is very important for long term recovery. Can add heel walks, toe walks forward and backward as well Ice heel for 15 minutes as needed. Avoid flat shoes/barefoot walking as much as possible. Arch straps have been shown to help with pain. Continue arch supports for these also. Follow up with me in 5-6 weeks.

## 2014-10-18 DIAGNOSIS — M25561 Pain in right knee: Secondary | ICD-10-CM | POA: Insufficient documentation

## 2014-10-18 DIAGNOSIS — M25562 Pain in left knee: Secondary | ICD-10-CM

## 2014-10-18 NOTE — Assessment & Plan Note (Signed)
now with plantar fasciitis, mild.  Shown home exercises to do daily.  Arch binders provided.  Continue sports insoles with scaphoid pads.

## 2014-10-18 NOTE — Assessment & Plan Note (Signed)
Much improved.  Night splints only if needed moving forward.

## 2014-10-18 NOTE — Assessment & Plan Note (Signed)
2/2 DJD.  Discussed options.  She would like to start with tylenol, glucosamine, topical medications, phsyical therapy and home exercises.  Consider injections if not improving.
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
## 2014-10-23 ENCOUNTER — Ambulatory Visit: Payer: Medicare Other | Attending: Family Medicine | Admitting: Physical Therapy

## 2014-10-23 ENCOUNTER — Encounter: Payer: Self-pay | Admitting: Physical Therapy

## 2014-10-23 DIAGNOSIS — M25562 Pain in left knee: Secondary | ICD-10-CM | POA: Insufficient documentation

## 2014-10-23 DIAGNOSIS — M25561 Pain in right knee: Secondary | ICD-10-CM
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
## 2014-10-25 ENCOUNTER — Ambulatory Visit: Payer: Medicare Other | Admitting: Physical Therapy

## 2014-10-25 DIAGNOSIS — M25561 Pain in right knee: Secondary | ICD-10-CM | POA: Diagnosis not present

## 2014-10-25 DIAGNOSIS — M25562 Pain in left knee: Principal | ICD-10-CM
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
## 2014-10-31 ENCOUNTER — Ambulatory Visit: Payer: Medicare Other

## 2014-11-02 ENCOUNTER — Ambulatory Visit: Payer: Medicare Other | Admitting: Physical Therapy

## 2014-11-06 ENCOUNTER — Ambulatory Visit: Payer: Medicare Other | Admitting: Physical Therapy

## 2014-11-21 ENCOUNTER — Ambulatory Visit (INDEPENDENT_AMBULATORY_CARE_PROVIDER_SITE_OTHER): Payer: Medicare Other | Admitting: Family Medicine

## 2014-11-21 ENCOUNTER — Encounter: Payer: Self-pay | Admitting: Family Medicine

## 2014-11-21 VITALS — BP 105/70 | HR 82 | Ht 65.0 in | Wt 170.0 lb

## 2014-11-21 DIAGNOSIS — M25562 Pain in left knee: Secondary | ICD-10-CM

## 2014-11-21 DIAGNOSIS — M79604 Pain in right leg: Secondary | ICD-10-CM | POA: Diagnosis not present

## 2014-11-21 DIAGNOSIS — M79605 Pain in left leg: Secondary | ICD-10-CM | POA: Diagnosis not present

## 2014-11-21 DIAGNOSIS — M25561 Pain in right knee: Secondary | ICD-10-CM | POA: Diagnosis not present

## 2014-11-21 NOTE — Patient Instructions (Signed)
Make an appointment as you leave today to come back for custom orthotics. Call me if you want to try injections for your knees. Otherwise follow up with me as needed for your feet and knees. Continue current exercises for another 6 weeks at least.
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
## 2014-11-22 NOTE — Assessment & Plan Note (Signed)
now with plantar fasciitis, mild.  Discussed options - she would like to try custom orthotics and will make an appointment to come back for these.  Continue sports insoles with scaphoid pads, arch binders in meantime, home exercises.

## 2014-11-22 NOTE — Assessment & Plan Note (Signed)
2/2 DJD.  Discussed options.  She would like to hold off on injections still, do OTC medicines, home exercises.

## 2014-12-04 ENCOUNTER — Encounter: Payer: Self-pay | Admitting: Family Medicine

## 2014-12-04 ENCOUNTER — Ambulatory Visit (INDEPENDENT_AMBULATORY_CARE_PROVIDER_SITE_OTHER): Payer: Medicare Other | Admitting: Family Medicine

## 2014-12-04 VITALS — BP 123/76 | HR 80 | Ht 65.0 in | Wt 170.0 lb

## 2014-12-04 DIAGNOSIS — M79605 Pain in left leg: Secondary | ICD-10-CM

## 2014-12-04 DIAGNOSIS — M79604 Pain in right leg: Secondary | ICD-10-CM | POA: Diagnosis not present
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
## 2014-12-11 NOTE — Assessment & Plan Note (Signed)
now with plantar fasciitis, mild.  Discussed options - she would like to try custom orthotics - made today.  Arch binders, home exercises.   Patient was fitted for a : standard, cushioned, semi-rigid orthotic. The orthotic was heated and afterward the patient stood on the orthotic blank positioned on the orthotic stand. The patient was positioned in subtalar neutral position and 10 degrees of ankle dorsiflexion in a weight bearing stance. After completion of molding, a stable base was applied to the orthotic blank. The blank was ground to a stable position for weight bearing. Size: 8 blue swirl Base: blue med density eva Posting: none Additional orthotic padding: none Felt very comfortable, less pain in orthotics. Total prep time 40 minutes

## 2015-01-10 ENCOUNTER — Ambulatory Visit: Payer: Medicare Other | Admitting: Family Medicine

## 2015-01-17 ENCOUNTER — Encounter: Payer: Medicare Other | Admitting: Family Medicine

## 2015-01-17 ENCOUNTER — Ambulatory Visit: Payer: Medicare Other | Admitting: Family Medicine

## 2015-01-18 NOTE — Progress Notes (Signed)
This encounter was created in error - please disregard.

## 2015-02-20 ENCOUNTER — Ambulatory Visit (INDEPENDENT_AMBULATORY_CARE_PROVIDER_SITE_OTHER): Payer: Medicare Other | Admitting: Family Medicine

## 2015-02-20 ENCOUNTER — Encounter: Payer: Self-pay | Admitting: Family Medicine

## 2015-02-20 VITALS — BP 125/75 | HR 78 | Ht 65.0 in | Wt 170.0 lb

## 2015-02-20 DIAGNOSIS — M79604 Pain in right leg: Secondary | ICD-10-CM

## 2015-02-20 DIAGNOSIS — M79605 Pain in left leg: Secondary | ICD-10-CM
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
## 2015-02-22 NOTE — Assessment & Plan Note (Signed)
has plantar fasciitis, metatarsalgia.  Advised against making orthotics more narrow after reviewing these and her inserts.  Shown how to change metatarsal pads in the other inserts she bought.  Advised to call me if she has any other problems.  F/u prn.

## 2015-02-27 ENCOUNTER — Ambulatory Visit (INDEPENDENT_AMBULATORY_CARE_PROVIDER_SITE_OTHER): Payer: Medicare Other | Admitting: Family Medicine

## 2015-02-27 ENCOUNTER — Encounter: Payer: Self-pay | Admitting: Family Medicine

## 2015-02-27 VITALS — BP 116/77 | HR 84 | Temp 98.6°F | Ht 65.0 in | Wt 174.4 lb

## 2015-02-27 DIAGNOSIS — R413 Other amnesia: Secondary | ICD-10-CM

## 2015-02-27 DIAGNOSIS — H9193 Unspecified hearing loss, bilateral: Secondary | ICD-10-CM

## 2015-02-27 NOTE — Assessment & Plan Note (Signed)
Refer to ENT

## 2015-02-27 NOTE — Assessment & Plan Note (Signed)
MMSE 30/30  Pt only complaing Family states she is fine
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
## 2015-02-27 NOTE — Patient Instructions (Signed)
Hearing Loss °Hearing loss is a partial or total loss of the ability to hear. This can be temporary or permanent, and it can happen in one or both ears. Hearing loss may be referred to as deafness. °Medical care is necessary to treat hearing loss properly and to prevent the condition from getting worse. Your hearing may partially or completely come back, depending on what caused your hearing loss and how severe it is. In some cases, hearing loss is permanent. °CAUSES °Common causes of hearing loss include:  °· Too much wax in the ear canal.   °· Infection of the ear canal or middle ear.   °· Fluid in the middle ear.   °· Injury to the ear or surrounding area.   °· An object stuck in the ear.   °· Prolonged exposure to loud sounds, such as music.   °Less common causes of hearing loss include:  °· Tumors in the ear.   °· Viral or bacterial infections, such as meningitis.   °· A hole in the eardrum (perforated eardrum). °· Problems with the hearing nerve that sends signals between the brain and the ear. °· Certain medicines.   °SYMPTOMS  °Symptoms of this condition may include: °· Difficulty telling the difference between sounds. °· Difficulty following a conversation when there is background noise. °· Lack of response to sounds in your environment. This may be most noticeable when you do not respond to startling sounds. °· Needing to turn up the volume on the television, radio, etc. °· Ringing in the ears. °· Dizziness. °· Pain in the ears. °DIAGNOSIS °This condition is diagnosed based on a physical exam and a hearing test (audiometry). The audiometry test will be performed by a hearing specialist (audiologist). You may also be referred to an ear, nose, and throat (ENT) specialist (otolaryngologist).  °TREATMENT °Treatment for recent onset of hearing loss may include:  °· Ear wax removal.   °· Being prescribed medicines to prevent infection (antibiotics).   °· Being prescribed medicines to reduce inflammation  (corticosteroids).   °HOME CARE INSTRUCTIONS °· If you were prescribed an antibiotic medicine, take it as told by your health care provider. Do not stop taking the antibiotic even if you start to feel better. °· Take over-the-counter and prescription medicines only as told by your health care provider. °· Avoid loud noises.   °· Return to your normal activities as told by your health care provider. Ask your health care provider what activities are safe for you. °· Keep all follow-up visits as told by your health care provider. This is important. °SEEK MEDICAL CARE IF:  °· You feel dizzy.   °· You develop new symptoms.   °· You vomit or feel nauseous.   °· You have a fever.   °SEEK IMMEDIATE MEDICAL CARE IF: °· You develop sudden changes in your vision.   °· You have severe ear pain.   °· You have new or increased weakness. °· You have a severe headache. °  °This information is not intended to replace advice given to you by your health care provider. Make sure you discuss any questions you have with your health care provider. °  °Document Released: 12/30/2004 Document Revised: 09/20/2014 Document Reviewed: 05/17/2014 °Elsevier Interactive Patient Education ©2016 [employer] ° °

## 2015-02-27 NOTE — Progress Notes (Signed)
Pre visit review using our clinic review tool, if applicable. No additional management support is needed unless otherwise documented below in the visit note. 

## 2015-04-02 ENCOUNTER — Ambulatory Visit: Payer: Medicare Other

## 2015-04-19 ENCOUNTER — Ambulatory Visit (INDEPENDENT_AMBULATORY_CARE_PROVIDER_SITE_OTHER): Payer: Medicare Other | Admitting: Family Medicine

## 2015-04-19 ENCOUNTER — Encounter: Payer: Self-pay | Admitting: Family Medicine

## 2015-04-19 VITALS — BP 108/74 | HR 80 | Ht 65.0 in | Wt 171.0 lb

## 2015-04-19 DIAGNOSIS — M79604 Pain in right leg: Secondary | ICD-10-CM | POA: Diagnosis not present

## 2015-04-19 DIAGNOSIS — M79605 Pain in left leg: Secondary | ICD-10-CM | POA: Diagnosis not present

## 2015-04-19 NOTE — Patient Instructions (Signed)
You have plantar fasciitis Take tylenol or aleve as needed for pain  Plantar fascia stretch for 20-30 seconds (do 3 of these) in morning Lowering/raise on a step exercises 3 x 10 once or twice a day - this is very important for long term recovery. Can add heel walks, toe walks forward and backward as well Ice heel for 15 minutes as needed. Avoid flat shoes/barefoot walking as much as possible. Wear the orthotics and switch them between different shoes. Consider night splints or night sock for when you sleep. Steroid injection is a consideration for short term pain relief if you are struggling. Let me know if you want to do physical therapy and we can put in a referral for this. If you want a second pair of orthotics tell Willette Cluster when you leave and she'll make a long appointment so we can make these. Active release is an option - they do this at [facility] [phone]).

## 2015-04-20 ENCOUNTER — Telehealth: Payer: Self-pay | Admitting: Family Medicine

## 2015-04-20 NOTE — Telephone Encounter (Signed)
I told her to consider getting a night splint or sock that would do this, not for her to hold it herself.  She can find those online or at some medical supply stores.

## 2015-04-20 NOTE — Telephone Encounter (Signed)
Spoke to patient and advised her where she could get a night splint or sock.
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
## 2015-04-20 NOTE — Assessment & Plan Note (Signed)
now primarily due to plantar fasciitis.  Encouraged to use her arch binders regularly.  Icing, continue home exercises.  She requested I make her orthotics more narrow - we discussed this last visit - I went ahead with doing so today after our lengthy discussion.  She will consider returning for a new pair of custom orthotics as well to fit her wider shoes.  Consider physical therapy, night splints, injection, active release in future.

## 2015-04-30 DIAGNOSIS — Z8669 Personal history of other diseases of the nervous system and sense organs: Secondary | ICD-10-CM | POA: Insufficient documentation

## 2015-04-30 DIAGNOSIS — H9313 Tinnitus, bilateral: Secondary | ICD-10-CM | POA: Diagnosis not present

## 2015-04-30 DIAGNOSIS — H903 Sensorineural hearing loss, bilateral: Secondary | ICD-10-CM | POA: Diagnosis not present

## 2015-04-30 HISTORY — DX: Tinnitus, bilateral: H93.13

## 2015-04-30 HISTORY — DX: Sensorineural hearing loss, bilateral: H90.3

## 2015-05-14 ENCOUNTER — Telehealth: Payer: Self-pay | Admitting: Family Medicine

## 2015-05-14 NOTE — Telephone Encounter (Signed)
Spoke to patient and told her that I will send in referral for PT downstairs.

## 2015-05-14 NOTE — Telephone Encounter (Signed)
Ok to send in PT referral.  Thanks!

## 2015-05-15 NOTE — Addendum Note (Signed)
Addended by: Sherrie George F on: 05/15/2015 02:36 PM   Modules accepted: Orders

## 2015-05-23 ENCOUNTER — Ambulatory Visit: Payer: Medicare Other | Admitting: Physical Therapy

## 2015-05-24 ENCOUNTER — Ambulatory Visit: Payer: Medicare Other | Attending: Family Medicine | Admitting: Physical Therapy

## 2015-05-24 DIAGNOSIS — M79671 Pain in right foot: Secondary | ICD-10-CM | POA: Diagnosis not present

## 2015-05-24 DIAGNOSIS — R262 Difficulty in walking, not elsewhere classified: Secondary | ICD-10-CM | POA: Insufficient documentation

## 2015-05-24 DIAGNOSIS — M79672 Pain in left foot: Secondary | ICD-10-CM | POA: Diagnosis not present
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
## 2015-05-29 ENCOUNTER — Ambulatory Visit: Payer: Medicare Other | Admitting: Physical Therapy

## 2015-05-29 DIAGNOSIS — R262 Difficulty in walking, not elsewhere classified: Secondary | ICD-10-CM

## 2015-05-29 DIAGNOSIS — M79672 Pain in left foot: Secondary | ICD-10-CM | POA: Diagnosis not present

## 2015-05-29 DIAGNOSIS — M79671 Pain in right foot: Secondary | ICD-10-CM | POA: Diagnosis not present
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
## 2015-06-05 ENCOUNTER — Ambulatory Visit: Payer: Medicare Other | Admitting: Physical Therapy

## 2015-06-05 DIAGNOSIS — R262 Difficulty in walking, not elsewhere classified: Secondary | ICD-10-CM | POA: Diagnosis not present

## 2015-06-05 DIAGNOSIS — M79671 Pain in right foot: Secondary | ICD-10-CM | POA: Diagnosis not present

## 2015-06-05 DIAGNOSIS — M79672 Pain in left foot: Secondary | ICD-10-CM | POA: Diagnosis not present
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
## 2015-06-13 ENCOUNTER — Ambulatory Visit: Payer: Medicare Other

## 2015-06-14 ENCOUNTER — Encounter: Payer: Self-pay | Admitting: Family Medicine

## 2015-06-14 ENCOUNTER — Ambulatory Visit (INDEPENDENT_AMBULATORY_CARE_PROVIDER_SITE_OTHER): Payer: Medicare Other | Admitting: Family Medicine

## 2015-06-14 VITALS — BP 110/86 | HR 79 | Temp 98.3°F

## 2015-06-14 DIAGNOSIS — Z0000 Encounter for general adult medical examination without abnormal findings: Secondary | ICD-10-CM | POA: Diagnosis not present

## 2015-06-14 DIAGNOSIS — Z8619 Personal history of other infectious and parasitic diseases: Secondary | ICD-10-CM

## 2015-06-14 DIAGNOSIS — R600 Localized edema: Secondary | ICD-10-CM

## 2015-06-14 DIAGNOSIS — E785 Hyperlipidemia, unspecified: Secondary | ICD-10-CM | POA: Diagnosis not present

## 2015-06-14 LAB — POCT URINALYSIS DIPSTICK
BILIRUBIN UA: NEGATIVE
Glucose, UA: NEGATIVE
KETONES UA: NEGATIVE
Leukocytes, UA: NEGATIVE
Nitrite, UA: NEGATIVE
PROTEIN UA: NEGATIVE
RBC UA: NEGATIVE
Spec Grav, UA: >=1.03
Urobilinogen, UA: 0.2
pH, UA: 6

## 2015-06-14 MED ORDER — HYDROCHLOROTHIAZIDE 25 MG PO TABS: 25.0000 mg | ORAL_TABLET | Freq: Every day | ORAL | Status: DC | PRN

## 2015-06-14 MED ORDER — ACYCLOVIR 5 % EX OINT: TOPICAL_OINTMENT | CUTANEOUS | Status: DC

## 2015-06-14 NOTE — Progress Notes (Signed)
Pre visit review using our clinic review tool, if applicable. No additional management support is needed unless otherwise documented below in the visit note. 

## 2015-06-14 NOTE — Progress Notes (Signed)
   Subjective:    Patient ID: Kimberly Escobar, female    DOB: Aug 13, 1955, 60 y.o.   MRN: [id number]  HPI    Review of Systems     Objective:   Physical Exam        Assessment & Plan:
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
## 2015-06-14 NOTE — Patient Instructions (Signed)
Preventive Care for Adults, Female A healthy lifestyle and preventive care can promote health and wellness. Preventive health guidelines for women include the following key practices.  A routine yearly physical is a good way to check with your health care provider about your health and preventive screening. It is a chance to share any concerns and updates on your health and to receive a thorough exam.  Visit your dentist for a routine exam and preventive care every 6 months. Brush your teeth twice a day and floss once a day. Good oral hygiene prevents tooth decay and gum disease.  The frequency of eye exams is based on your age, health, family medical history, use of contact lenses, and other factors. Follow your health care provider's recommendations for frequency of eye exams.  Eat a healthy diet. Foods like vegetables, fruits, whole grains, low-fat dairy products, and lean protein foods contain the nutrients you need without too many calories. Decrease your intake of foods high in solid fats, added sugars, and salt. Eat the right amount of calories for you.Get information about a proper diet from your health care provider, if necessary.  Regular physical exercise is one of the most important things you can do for your health. Most adults should get at least 150 minutes of moderate-intensity exercise (any activity that increases your heart rate and causes you to sweat) each week. In addition, most adults need muscle-strengthening exercises on 2 or more days a week.  Maintain a healthy weight. The body mass index (BMI) is a screening tool to identify possible weight problems. It provides an estimate of body fat based on height and weight. Your health care provider can find your BMI and can help you achieve or maintain a healthy weight.For adults 20 years and older:  A BMI below 18.5 is considered underweight.  A BMI of 18.5 to 24.9 is normal.  A BMI of 25 to 29.9 is considered overweight.  A  BMI of 30 and above is considered obese.  Maintain normal blood lipids and cholesterol levels by exercising and minimizing your intake of saturated fat. Eat a balanced diet with plenty of fruit and vegetables. Blood tests for lipids and cholesterol should begin at age 45 and be repeated every 5 years. If your lipid or cholesterol levels are high, you are over 50, or you are at high risk for heart disease, you may need your cholesterol levels checked more frequently.Ongoing high lipid and cholesterol levels should be treated with medicines if diet and exercise are not working.  If you smoke, find out from your health care provider how to quit. If you do not use tobacco, do not start.  Lung cancer screening is recommended for adults aged 45-80 years who are at high risk for developing lung cancer because of a history of smoking. A yearly low-dose CT scan of the lungs is recommended for people who have at least a 30-pack-year history of smoking and are a current smoker or have quit within the past 15 years. A pack year of smoking is smoking an average of 1 pack of cigarettes a day for 1 year (for example: 1 pack a day for 30 years or 2 packs a day for 15 years). Yearly screening should continue until the smoker has stopped smoking for at least 15 years. Yearly screening should be stopped for people who develop a health problem that would prevent them from having lung cancer treatment.  If you are pregnant, do not drink alcohol. If you are  breastfeeding, be very cautious about drinking alcohol. If you are not pregnant and choose to drink alcohol, do not have more than 1 drink per day. One drink is considered to be 12 ounces (355 mL) of beer, 5 ounces (148 mL) of wine, or 1.5 ounces (44 mL) of liquor.  Avoid use of street drugs. Do not share needles with anyone. Ask for help if you need support or instructions about stopping the use of drugs.  High blood pressure causes heart disease and increases the risk  of stroke. Your blood pressure should be checked at least every 1 to 2 years. Ongoing high blood pressure should be treated with medicines if weight loss and exercise do not work.  If you are 55-79 years old, ask your health care provider if you should take aspirin to prevent strokes.  Diabetes screening is done by taking a blood sample to check your blood glucose level after you have not eaten for a certain period of time (fasting). If you are not overweight and you do not have risk factors for diabetes, you should be screened once every 3 years starting at age 45. If you are overweight or obese and you are 40-70 years of age, you should be screened for diabetes every year as part of your cardiovascular risk assessment.  Breast cancer screening is essential preventive care for women. You should practice "breast self-awareness." This means understanding the normal appearance and feel of your breasts and may include breast self-examination. Any changes detected, no matter how small, should be reported to a health care provider. Women in their 20s and 30s should have a clinical breast exam (CBE) by a health care provider as part of a regular health exam every 1 to 3 years. After age 40, women should have a CBE every year. Starting at age 40, women should consider having a mammogram (breast X-ray test) every year. Women who have a family history of breast cancer should talk to their health care provider about genetic screening. Women at a high risk of breast cancer should talk to their health care providers about having an MRI and a mammogram every year.  Breast cancer gene (BRCA)-related cancer risk assessment is recommended for women who have family members with BRCA-related cancers. BRCA-related cancers include breast, ovarian, tubal, and peritoneal cancers. Having family members with these cancers may be associated with an increased risk for harmful changes (mutations) in the breast cancer genes BRCA1 and  BRCA2. Results of the assessment will determine the need for genetic counseling and BRCA1 and BRCA2 testing.  Your health care provider may recommend that you be screened regularly for cancer of the pelvic organs (ovaries, uterus, and vagina). This screening involves a pelvic examination, including checking for microscopic changes to the surface of your cervix (Pap test). You may be encouraged to have this screening done every 3 years, beginning at age 21.  For women ages 30-65, health care providers may recommend pelvic exams and Pap testing every 3 years, or they may recommend the Pap and pelvic exam, combined with testing for human papilloma virus (HPV), every 5 years. Some types of HPV increase your risk of cervical cancer. Testing for HPV may also be done on women of any age with unclear Pap test results.  Other health care providers may not recommend any screening for nonpregnant women who are considered low risk for pelvic cancer and who do not have symptoms. Ask your health care provider if a screening pelvic exam is right for   you.  If you have had past treatment for cervical cancer or a condition that could lead to cancer, you need Pap tests and screening for cancer for at least 20 years after your treatment. If Pap tests have been discontinued, your risk factors (such as having a new sexual partner) need to be reassessed to determine if screening should resume. Some women have medical problems that increase the chance of getting cervical cancer. In these cases, your health care provider may recommend more frequent screening and Pap tests.  Colorectal cancer can be detected and often prevented. Most routine colorectal cancer screening begins at the age of 50 years and continues through age 75 years. However, your health care provider may recommend screening at an earlier age if you have risk factors for colon cancer. On a yearly basis, your health care provider may provide home test kits to check  for hidden blood in the stool. Use of a small camera at the end of a tube, to directly examine the colon (sigmoidoscopy or colonoscopy), can detect the earliest forms of colorectal cancer. Talk to your health care provider about this at age 50, when routine screening begins. Direct exam of the colon should be repeated every 5-10 years through age 75 years, unless early forms of precancerous polyps or small growths are found.  People who are at an increased risk for hepatitis B should be screened for this virus. You are considered at high risk for hepatitis B if:  You were born in a country where hepatitis B occurs often. Talk with your health care provider about which countries are considered high risk.  Your parents were born in a high-risk country and you have not received a shot to protect against hepatitis B (hepatitis B vaccine).  You have HIV or AIDS.  You use needles to inject street drugs.  You live with, or have sex with, someone who has hepatitis B.  You get hemodialysis treatment.  You take certain medicines for conditions like cancer, organ transplantation, and autoimmune conditions.  Hepatitis C blood testing is recommended for all people born from 1945 through 1965 and any individual with known risks for hepatitis C.  Practice safe sex. Use condoms and avoid high-risk sexual practices to reduce the spread of sexually transmitted infections (STIs). STIs include gonorrhea, chlamydia, syphilis, trichomonas, herpes, HPV, and human immunodeficiency virus (HIV). Herpes, HIV, and HPV are viral illnesses that have no cure. They can result in disability, cancer, and death.  You should be screened for sexually transmitted illnesses (STIs) including gonorrhea and chlamydia if:  You are sexually active and are younger than 24 years.  You are older than 24 years and your health care provider tells you that you are at risk for this type of infection.  Your sexual activity has changed  since you were last screened and you are at an increased risk for chlamydia or gonorrhea. Ask your health care provider if you are at risk.  If you are at risk of being infected with HIV, it is recommended that you take a prescription medicine daily to prevent HIV infection. This is called preexposure prophylaxis (PrEP). You are considered at risk if:  You are sexually active and do not regularly use condoms or know the HIV status of your partner(s).  You take drugs by injection.  You are sexually active with a partner who has HIV.  Talk with your health care provider about whether you are at high risk of being infected with HIV. If   you choose to begin PrEP, you should first be tested for HIV. You should then be tested every 3 months for as long as you are taking PrEP.  Osteoporosis is a disease in which the bones lose minerals and strength with aging. This can result in serious bone fractures or breaks. The risk of osteoporosis can be identified using a bone density scan. Women ages 67 years and over and women at risk for fractures or osteoporosis should discuss screening with their health care providers. Ask your health care provider whether you should take a calcium supplement or vitamin D to reduce the rate of osteoporosis.  Menopause can be associated with physical symptoms and risks. Hormone replacement therapy is available to decrease symptoms and risks. You should talk to your health care provider about whether hormone replacement therapy is right for you.  Use sunscreen. Apply sunscreen liberally and repeatedly throughout the day. You should seek shade when your shadow is shorter than you. Protect yourself by wearing long sleeves, pants, a wide-brimmed hat, and sunglasses year round, whenever you are outdoors.  Once a month, do a whole body skin exam, using a mirror to look at the skin on your back. Tell your health care provider of new moles, moles that have irregular borders, moles that  are larger than a pencil eraser, or moles that have changed in shape or color.  Stay current with required vaccines (immunizations).  Influenza vaccine. All adults should be immunized every year.  Tetanus, diphtheria, and acellular pertussis (Td, Tdap) vaccine. Pregnant women should receive 1 dose of Tdap vaccine during each pregnancy. The dose should be obtained regardless of the length of time since the last dose. Immunization is preferred during the 27th-36th week of gestation. An adult who has not previously received Tdap or who does not know her vaccine status should receive 1 dose of Tdap. This initial dose should be followed by tetanus and diphtheria toxoids (Td) booster doses every 10 years. Adults with an unknown or incomplete history of completing a 3-dose immunization series with Td-containing vaccines should begin or complete a primary immunization series including a Tdap dose. Adults should receive a Td booster every 10 years.  Varicella vaccine. An adult without evidence of immunity to varicella should receive 2 doses or a second dose if she has previously received 1 dose. Pregnant females who do not have evidence of immunity should receive the first dose after pregnancy. This first dose should be obtained before leaving the health care facility. The second dose should be obtained 4-8 weeks after the first dose.  Human papillomavirus (HPV) vaccine. Females aged 13-26 years who have not received the vaccine previously should obtain the 3-dose series. The vaccine is not recommended for use in pregnant females. However, pregnancy testing is not needed before receiving a dose. If a female is found to be pregnant after receiving a dose, no treatment is needed. In that case, the remaining doses should be delayed until after the pregnancy. Immunization is recommended for any person with an immunocompromised condition through the age of 61 years if she did not get any or all doses earlier. During the  3-dose series, the second dose should be obtained 4-8 weeks after the first dose. The third dose should be obtained 24 weeks after the first dose and 16 weeks after the second dose.  Zoster vaccine. One dose is recommended for adults aged 30 years or older unless certain conditions are present.  Measles, mumps, and rubella (MMR) vaccine. Adults born  before 1957 generally are considered immune to measles and mumps. Adults born in 1957 or later should have 1 or more doses of MMR vaccine unless there is a contraindication to the vaccine or there is laboratory evidence of immunity to each of the three diseases. A routine second dose of MMR vaccine should be obtained at least 28 days after the first dose for students attending postsecondary schools, health care workers, or international travelers. People who received inactivated measles vaccine or an unknown type of measles vaccine during 1963-1967 should receive 2 doses of MMR vaccine. People who received inactivated mumps vaccine or an unknown type of mumps vaccine before 1979 and are at high risk for mumps infection should consider immunization with 2 doses of MMR vaccine. For females of childbearing age, rubella immunity should be determined. If there is no evidence of immunity, females who are not pregnant should be vaccinated. If there is no evidence of immunity, females who are pregnant should delay immunization until after pregnancy. Unvaccinated health care workers born before 1957 who lack laboratory evidence of measles, mumps, or rubella immunity or laboratory confirmation of disease should consider measles and mumps immunization with 2 doses of MMR vaccine or rubella immunization with 1 dose of MMR vaccine.  Pneumococcal 13-valent conjugate (PCV13) vaccine. When indicated, a person who is uncertain of his immunization history and has no record of immunization should receive the PCV13 vaccine. All adults 65 years of age and older should receive this  vaccine. An adult aged 19 years or older who has certain medical conditions and has not been previously immunized should receive 1 dose of PCV13 vaccine. This PCV13 should be followed with a dose of pneumococcal polysaccharide (PPSV23) vaccine. Adults who are at high risk for pneumococcal disease should obtain the PPSV23 vaccine at least 8 weeks after the dose of PCV13 vaccine. Adults older than 62 years of age who have normal immune system function should obtain the PPSV23 vaccine dose at least 1 year after the dose of PCV13 vaccine.  Pneumococcal polysaccharide (PPSV23) vaccine. When PCV13 is also indicated, PCV13 should be obtained first. All adults aged 65 years and older should be immunized. An adult younger than age 65 years who has certain medical conditions should be immunized. Any person who resides in a nursing home or long-term care facility should be immunized. An adult smoker should be immunized. People with an immunocompromised condition and certain other conditions should receive both PCV13 and PPSV23 vaccines. People with human immunodeficiency virus (HIV) infection should be immunized as soon as possible after diagnosis. Immunization during chemotherapy or radiation therapy should be avoided. Routine use of PPSV23 vaccine is not recommended for American Indians, [place] Natives, or people younger than 65 years unless there are medical conditions that require PPSV23 vaccine. When indicated, people who have unknown immunization and have no record of immunization should receive PPSV23 vaccine. One-time revaccination 5 years after the first dose of PPSV23 is recommended for people aged 19-64 years who have chronic kidney failure, nephrotic syndrome, asplenia, or immunocompromised conditions. People who received 1-2 doses of PPSV23 before age 65 years should receive another dose of PPSV23 vaccine at age 65 years or later if at least 5 years have passed since the previous dose. Doses of PPSV23 are not  needed for people immunized with PPSV23 at or after age 65 years.  Meningococcal vaccine. Adults with asplenia or persistent complement component deficiencies should receive 2 doses of quadrivalent meningococcal conjugate (MenACWY-D) vaccine. The doses should be obtained   at least 2 months apart. Microbiologists working with certain meningococcal bacteria, [place] recruits, people at risk during an outbreak, and people who travel to or live in countries with a high rate of meningitis should be immunized. A first-year college student up through age 34 years who is living in a residence hall should receive a dose if she did not receive a dose on or after her 16th birthday. Adults who have certain high-risk conditions should receive one or more doses of vaccine.  Hepatitis A vaccine. Adults who wish to be protected from this disease, have certain high-risk conditions, work with hepatitis A-infected animals, work in hepatitis A research labs, or travel to or work in countries with a high rate of hepatitis A should be immunized. Adults who were previously unvaccinated and who anticipate close contact with an international adoptee during the first 60 days after arrival in the [place] from a country with a high rate of hepatitis A should be immunized.  Hepatitis B vaccine. Adults who wish to be protected from this disease, have certain high-risk conditions, may be exposed to blood or other infectious body fluids, are household contacts or sex partners of hepatitis B positive people, are clients or workers in certain care facilities, or travel to or work in countries with a high rate of hepatitis B should be immunized.  Haemophilus influenzae type b (Hib) vaccine. A previously unvaccinated person with asplenia or sickle cell disease or having a scheduled splenectomy should receive 1 dose of Hib vaccine. Regardless of previous immunization, a recipient of a hematopoietic stem cell transplant should receive a  3-dose series 6-12 months after her successful transplant. Hib vaccine is not recommended for adults with HIV infection. Preventive Services / Frequency Ages 35 to 4 years  Blood pressure check.** / Every 3-5 years.  Lipid and cholesterol check.** / Every 5 years beginning at age 60.  Clinical breast exam.** / Every 3 years for women in their 71s and 10s.  BRCA-related cancer risk assessment.** / For women who have family members with a BRCA-related cancer (breast, ovarian, tubal, or peritoneal cancers).  Pap test.** / Every 2 years from ages 76 through 26. Every 3 years starting at age 61 through age 76 or 93 with a history of 3 consecutive normal Pap tests.  HPV screening.** / Every 3 years from ages 37 through ages 60 to 51 with a history of 3 consecutive normal Pap tests.  Hepatitis C blood test.** / For any individual with known risks for hepatitis C.  Skin self-exam. / Monthly.  Influenza vaccine. / Every year.  Tetanus, diphtheria, and acellular pertussis (Tdap, Td) vaccine.** / Consult your health care provider. Pregnant women should receive 1 dose of Tdap vaccine during each pregnancy. 1 dose of Td every 10 years.  Varicella vaccine.** / Consult your health care provider. Pregnant females who do not have evidence of immunity should receive the first dose after pregnancy.  HPV vaccine. / 3 doses over 6 months, if 93 and younger. The vaccine is not recommended for use in pregnant females. However, pregnancy testing is not needed before receiving a dose.  Measles, mumps, rubella (MMR) vaccine.** / You need at least 1 dose of MMR if you were born in 1957 or later. You may also need a 2nd dose. For females of childbearing age, rubella immunity should be determined. If there is no evidence of immunity, females who are not pregnant should be vaccinated. If there is no evidence of immunity, females who are  pregnant should delay immunization until after pregnancy.  Pneumococcal  13-valent conjugate (PCV13) vaccine.** / Consult your health care provider.  Pneumococcal polysaccharide (PPSV23) vaccine.** / 1 to 2 doses if you smoke cigarettes or if you have certain conditions.  Meningococcal vaccine.** / 1 dose if you are age 68 to 8 years and a Market researcher living in a residence hall, or have one of several medical conditions, you need to get vaccinated against meningococcal disease. You may also need additional booster doses.  Hepatitis A vaccine.** / Consult your health care provider.  Hepatitis B vaccine.** / Consult your health care provider.  Haemophilus influenzae type b (Hib) vaccine.** / Consult your health care provider. Ages 7 to 53 years  Blood pressure check.** / Every year.  Lipid and cholesterol check.** / Every 5 years beginning at age 25 years.  Lung cancer screening. / Every year if you are aged 11-80 years and have a 30-pack-year history of smoking and currently smoke or have quit within the past 15 years. Yearly screening is stopped once you have quit smoking for at least 15 years or develop a health problem that would prevent you from having lung cancer treatment.  Clinical breast exam.** / Every year after age 48 years.  BRCA-related cancer risk assessment.** / For women who have family members with a BRCA-related cancer (breast, ovarian, tubal, or peritoneal cancers).  Mammogram.** / Every year beginning at age 41 years and continuing for as long as you are in good health. Consult with your health care provider.  Pap test.** / Every 3 years starting at age 65 years through age 37 or 70 years with a history of 3 consecutive normal Pap tests.  HPV screening.** / Every 3 years from ages 72 years through ages 60 to 40 years with a history of 3 consecutive normal Pap tests.  Fecal occult blood test (FOBT) of stool. / Every year beginning at age 21 years and continuing until age 5 years. You may not need to do this test if you get  a colonoscopy every 10 years.  Flexible sigmoidoscopy or colonoscopy.** / Every 5 years for a flexible sigmoidoscopy or every 10 years for a colonoscopy beginning at age 35 years and continuing until age 48 years.  Hepatitis C blood test.** / For all people born from [birth year] through 1965 and any individual with known risks for hepatitis C.  Skin self-exam. / Monthly.  Influenza vaccine. / Every year.  Tetanus, diphtheria, and acellular pertussis (Tdap/Td) vaccine.** / Consult your health care provider. Pregnant women should receive 1 dose of Tdap vaccine during each pregnancy. 1 dose of Td every 10 years.  Varicella vaccine.** / Consult your health care provider. Pregnant females who do not have evidence of immunity should receive the first dose after pregnancy.  Zoster vaccine.** / 1 dose for adults aged 30 years or older.  Measles, mumps, rubella (MMR) vaccine.** / You need at least 1 dose of MMR if you were born in 1957 or later. You may also need a second dose. For females of childbearing age, rubella immunity should be determined. If there is no evidence of immunity, females who are not pregnant should be vaccinated. If there is no evidence of immunity, females who are pregnant should delay immunization until after pregnancy.  Pneumococcal 13-valent conjugate (PCV13) vaccine.** / Consult your health care provider.  Pneumococcal polysaccharide (PPSV23) vaccine.** / 1 to 2 doses if you smoke cigarettes or if you have certain conditions.  Meningococcal vaccine.** /  Consult your health care provider.  Hepatitis A vaccine.** / Consult your health care provider.  Hepatitis B vaccine.** / Consult your health care provider.  Haemophilus influenzae type b (Hib) vaccine.** / Consult your health care provider. Ages 64 years and over  Blood pressure check.** / Every year.  Lipid and cholesterol check.** / Every 5 years beginning at age 23 years.  Lung cancer screening. / Every year if you  are aged 16-80 years and have a 30-pack-year history of smoking and currently smoke or have quit within the past 15 years. Yearly screening is stopped once you have quit smoking for at least 15 years or develop a health problem that would prevent you from having lung cancer treatment.  Clinical breast exam.** / Every year after age 74 years.  BRCA-related cancer risk assessment.** / For women who have family members with a BRCA-related cancer (breast, ovarian, tubal, or peritoneal cancers).  Mammogram.** / Every year beginning at age 44 years and continuing for as long as you are in good health. Consult with your health care provider.  Pap test.** / Every 3 years starting at age 58 years through age 22 or 39 years with 3 consecutive normal Pap tests. Testing can be stopped between 65 and 70 years with 3 consecutive normal Pap tests and no abnormal Pap or HPV tests in the past 10 years.  HPV screening.** / Every 3 years from ages 64 years through ages 70 or 61 years with a history of 3 consecutive normal Pap tests. Testing can be stopped between 65 and 70 years with 3 consecutive normal Pap tests and no abnormal Pap or HPV tests in the past 10 years.  Fecal occult blood test (FOBT) of stool. / Every year beginning at age 40 years and continuing until age 27 years. You may not need to do this test if you get a colonoscopy every 10 years.  Flexible sigmoidoscopy or colonoscopy.** / Every 5 years for a flexible sigmoidoscopy or every 10 years for a colonoscopy beginning at age 7 years and continuing until age 32 years.  Hepatitis C blood test.** / For all people born from [birth year] through 1965 and any individual with known risks for hepatitis C.  Osteoporosis screening.** / A one-time screening for women ages 30 years and over and women at risk for fractures or osteoporosis.  Skin self-exam. / Monthly.  Influenza vaccine. / Every year.  Tetanus, diphtheria, and acellular pertussis (Tdap/Td)  vaccine.** / 1 dose of Td every 10 years.  Varicella vaccine.** / Consult your health care provider.  Zoster vaccine.** / 1 dose for adults aged 35 years or older.  Pneumococcal 13-valent conjugate (PCV13) vaccine.** / Consult your health care provider.  Pneumococcal polysaccharide (PPSV23) vaccine.** / 1 dose for all adults aged 46 years and older.  Meningococcal vaccine.** / Consult your health care provider.  Hepatitis A vaccine.** / Consult your health care provider.  Hepatitis B vaccine.** / Consult your health care provider.  Haemophilus influenzae type b (Hib) vaccine.** / Consult your health care provider. ** Family history and personal history of risk and conditions may change your health care provider's recommendations.   This information is not intended to replace advice given to you by your health care provider. Make sure you discuss any questions you have with your health care provider.   Document Released: 02/25/2001 Document Revised: 01/20/2014 Document Reviewed: 05/27/2010 Elsevier Interactive Patient Education [employer].

## 2015-06-15 LAB — CBC WITH DIFFERENTIAL/PLATELET
BASOS ABS: 0 10*3/uL (ref 0.0–0.1)
BASOS PCT: 0.3 % (ref 0.0–3.0)
EOS ABS: 0.1 10*3/uL (ref 0.0–0.7)
Eosinophils Relative: 1.2 % (ref 0.0–5.0)
HEMATOCRIT: 40.1 % (ref 36.0–46.0)
Hemoglobin: 13.6 g/dL (ref 12.0–15.0)
LYMPHS ABS: 2.7 10*3/uL (ref 0.7–4.0)
Lymphocytes Relative: 31 % (ref 12.0–46.0)
MCHC: 33.9 g/dL (ref 30.0–36.0)
MCV: 93.2 fl (ref 78.0–100.0)
MONO ABS: 0.7 10*3/uL (ref 0.1–1.0)
Monocytes Relative: 7.5 % (ref 3.0–12.0)
NEUTROS ABS: 5.3 10*3/uL (ref 1.4–7.7)
NEUTROS PCT: 60 % (ref 43.0–77.0)
PLATELETS: 307 10*3/uL (ref 150.0–400.0)
RBC: 4.3 Mil/uL (ref 3.87–5.11)
RDW: 13.4 % (ref 11.5–15.5)
WBC: 8.9 10*3/uL (ref 4.0–10.5)

## 2015-06-15 LAB — COMPREHENSIVE METABOLIC PANEL
ALBUMIN: 4.8 g/dL (ref 3.5–5.2)
ALK PHOS: 83 U/L (ref 39–117)
ALT: 29 U/L (ref 0–35)
AST: 20 U/L (ref 0–37)
BILIRUBIN TOTAL: 0.2 mg/dL (ref 0.2–1.2)
BUN: 31 mg/dL — ABNORMAL HIGH (ref 6–23)
CO2: 30 mEq/L (ref 19–32)
Calcium: 10.1 mg/dL (ref 8.4–10.5)
Chloride: 101 mEq/L (ref 96–112)
Creatinine, Ser: 1.11 mg/dL (ref 0.40–1.20)
GFR: 64.11 mL/min (ref >60.00)
GLUCOSE: 83 mg/dL (ref 70–99)
Potassium: 4.1 mEq/L (ref 3.5–5.1)
Sodium: 140 mEq/L (ref 135–145)
Total Protein: 8.5 g/dL — ABNORMAL HIGH (ref 6.0–8.3)

## 2015-06-15 LAB — LIPID PANEL
CHOLESTEROL: 213 mg/dL — AB (ref 0–200)
HDL: 54.2 mg/dL (ref >39.00)
NONHDL: 158.86
TRIGLYCERIDES: 214 mg/dL — AB (ref 0.0–149.0)
Total CHOL/HDL Ratio: 4
VLDL: 42.8 mg/dL — ABNORMAL HIGH (ref 0.0–40.0)

## 2015-06-15 LAB — LDL CHOLESTEROL, DIRECT: LDL DIRECT: 102 mg/dL

## 2015-06-19 ENCOUNTER — Ambulatory Visit: Payer: Medicare Other | Admitting: Physical Therapy

## 2015-06-24 ENCOUNTER — Other Ambulatory Visit: Payer: Self-pay | Admitting: Family Medicine

## 2015-06-26 ENCOUNTER — Ambulatory Visit: Payer: Medicare Other | Admitting: Physical Therapy

## 2015-07-03 ENCOUNTER — Ambulatory Visit: Payer: Medicare Other | Admitting: Physical Therapy

## 2015-08-09 ENCOUNTER — Telehealth: Payer: Self-pay | Admitting: Family Medicine

## 2015-08-09 ENCOUNTER — Ambulatory Visit: Payer: Medicare Other | Admitting: Family Medicine

## 2015-08-13 ENCOUNTER — Ambulatory Visit: Payer: Medicare Other | Admitting: Family Medicine

## 2015-08-15 NOTE — Telephone Encounter (Signed)
Pt was no show 08/09/15 for acute appt, 3rd no show w/in 1 year (1 with Ashlee), several cancellations, charge or no charge?

## 2015-08-16 DIAGNOSIS — Z1231 Encounter for screening mammogram for malignant neoplasm of breast: Secondary | ICD-10-CM | POA: Diagnosis not present

## 2015-08-16 DIAGNOSIS — Z803 Family history of malignant neoplasm of breast: Secondary | ICD-10-CM | POA: Diagnosis not present

## 2015-08-16 LAB — HM MAMMOGRAPHY

## 2015-08-16 NOTE — Telephone Encounter (Signed)
No charge. 

## 2015-08-28 ENCOUNTER — Encounter: Payer: Self-pay | Admitting: Family Medicine

## 2015-09-06 ENCOUNTER — Ambulatory Visit (HOSPITAL_BASED_OUTPATIENT_CLINIC_OR_DEPARTMENT_OTHER)
Admission: RE | Admit: 2015-09-06 | Discharge: 2015-09-06 | Disposition: A | Discharge status: Home / Self Care | Payer: Medicare Other | Source: Ambulatory Visit | Attending: Family Medicine | Admitting: Family Medicine

## 2015-09-06 ENCOUNTER — Ambulatory Visit (INDEPENDENT_AMBULATORY_CARE_PROVIDER_SITE_OTHER): Payer: Medicare Other | Admitting: Family Medicine

## 2015-09-06 ENCOUNTER — Encounter: Payer: Self-pay | Admitting: Family Medicine

## 2015-09-06 VITALS — BP 106/72 | HR 71 | Ht 65.0 in | Wt 171.0 lb

## 2015-09-06 DIAGNOSIS — M79642 Pain in left hand: Secondary | ICD-10-CM | POA: Diagnosis not present

## 2015-09-06 DIAGNOSIS — M79604 Pain in right leg: Secondary | ICD-10-CM

## 2015-09-06 DIAGNOSIS — M79641 Pain in right hand: Secondary | ICD-10-CM

## 2015-09-06 DIAGNOSIS — M25561 Pain in right knee: Secondary | ICD-10-CM

## 2015-09-06 DIAGNOSIS — M179 Osteoarthritis of knee, unspecified: Secondary | ICD-10-CM | POA: Diagnosis not present

## 2015-09-06 DIAGNOSIS — M25562 Pain in left knee: Secondary | ICD-10-CM | POA: Insufficient documentation

## 2015-09-06 DIAGNOSIS — M79605 Pain in left leg: Secondary | ICD-10-CM

## 2015-09-06 NOTE — Patient Instructions (Addendum)
Take tylenol 500mg  1-2 tabs three times a day for pain. Aleve 1-2 tabs twice a day with food Glucosamine sulfate 750mg  twice a day is a supplement that may help. Ok to try devils claw, tart cherry, turmeric. Capsaicin, aspercreme, or biofreeze topically up to four times a day may also help with pain. Cortisone injections are an option. If cortisone injections do not help, there are different types of shots that may help but they take longer to take effect. It's important that you continue to stay active. Consider physical therapy to strengthen muscles around the joint that hurts to take pressure off of the joint itself. Shoe inserts with good arch support may be helpful. Walker or cane if needed. Heat or ice 15 minutes at a time 3-4 times a day as needed to help with pain. Water aerobics and cycling with low resistance are the best two types of exercise for arthritis - check with your gym about water classes - call us if you want [place] to look into aqua therapy and the closest location. Follow up with me as needed otherwise.

## 2015-09-10 DIAGNOSIS — M79642 Pain in left hand: Secondary | ICD-10-CM

## 2015-09-10 DIAGNOSIS — M79641 Pain in right hand: Secondary | ICD-10-CM | POA: Insufficient documentation

## 2015-09-10 NOTE — Assessment & Plan Note (Signed)
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  Independently reviewed her hand radiographs confirmed this.  Discussed tylenol, nsaids, glucosamine, topical medications.  Heat.

## 2015-09-10 NOTE — Assessment & Plan Note (Signed)
  independently reviewed radiographs confirming DJD.  Discussed tylenol, nsaids, glucosamine, topical medications.  Heat vs ice.  She is looking into water aerobics as well.  F/u prn.
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
## 2015-09-10 NOTE — Assessment & Plan Note (Signed)
2/2 plantar fasciitis.  Continue arch binders, home exercises, orthotics.  Icing, tylenol if needed.

## 2016-02-25 ENCOUNTER — Other Ambulatory Visit: Payer: Self-pay | Admitting: Family Medicine

## 2016-02-25 DIAGNOSIS — Z8619 Personal history of other infectious and parasitic diseases: Secondary | ICD-10-CM

## 2016-02-25 NOTE — Telephone Encounter (Signed)
Relation to [id number] Call back number:[phone] Pharmacy:  [facility], [place] - [address] AT [address] [phone] (Phone) [phone] (Fax)     Reason for call:  Patient requesting generic of acyclovir ointment (ZOVIRAX) 5 %  And would like to discuss rash on face, please advise

## 2016-02-26 MED ORDER — ACYCLOVIR 5 % EX OINT: TOPICAL_OINTMENT | CUTANEOUS | 0 refills | Status: DC

## 2016-02-26 NOTE — Telephone Encounter (Signed)
Sent in prescription and patient aware

## 2016-02-29 ENCOUNTER — Encounter: Payer: Self-pay | Admitting: Family Medicine

## 2016-02-29 ENCOUNTER — Ambulatory Visit (INDEPENDENT_AMBULATORY_CARE_PROVIDER_SITE_OTHER): Payer: Medicare Other | Admitting: Family Medicine

## 2016-02-29 VITALS — BP 100/62 | HR 67 | Temp 98.2°F | Resp 16 | Ht 65.0 in | Wt 174.0 lb

## 2016-02-29 DIAGNOSIS — R739 Hyperglycemia, unspecified: Secondary | ICD-10-CM

## 2016-02-29 DIAGNOSIS — Z0000 Encounter for general adult medical examination without abnormal findings: Secondary | ICD-10-CM

## 2016-02-29 DIAGNOSIS — M25512 Pain in left shoulder: Secondary | ICD-10-CM

## 2016-02-29 DIAGNOSIS — R079 Chest pain, unspecified: Secondary | ICD-10-CM

## 2016-02-29 DIAGNOSIS — M25519 Pain in unspecified shoulder: Secondary | ICD-10-CM | POA: Diagnosis not present

## 2016-02-29 DIAGNOSIS — E785 Hyperlipidemia, unspecified: Secondary | ICD-10-CM | POA: Diagnosis not present

## 2016-02-29 MED ORDER — MELOXICAM 15 MG PO TABS: ORAL_TABLET | ORAL | 0 refills | Status: DC

## 2016-02-29 MED ORDER — CYCLOBENZAPRINE HCL 10 MG PO TABS: 10.0000 mg | ORAL_TABLET | Freq: Three times a day (TID) | ORAL | 0 refills | Status: DC | PRN

## 2016-02-29 NOTE — Patient Instructions (Signed)
Shoulder Pain Many things can cause shoulder pain, including:  An injury.  Moving the arm in the same way again and again (overuse).  Joint pain (arthritis). Follow these instructions at home: Take these actions to help with your pain:  Squeeze a soft ball or a foam pad as much as you can. This helps to prevent swelling. It also makes the arm stronger.  Take over-the-counter and prescription medicines only as told by your doctor.  If told, put ice on the area:  Put ice in a plastic bag.  Place a towel between your skin and the bag.  Leave the ice on for 20 minutes, 2-3 times per day. Stop putting on ice if it does not help with the pain.  If you were given a shoulder sling or immobilizer:  Wear it as told.  Remove it to shower or bathe.  Move your arm as little as possible.  Keep your hand moving. This helps prevent swelling. Contact a doctor if:  Your pain gets worse.  Medicine does not help your pain.  You have new pain in your arm, hand, or fingers. Get help right away if:  Your arm, hand, or fingers:  Tingle.  Are numb.  Are swollen.  Are painful.  Turn white or blue. This information is not intended to replace advice given to you by your health care provider. Make sure you discuss any questions you have with your health care provider. Document Released: 06/18/2007 Document Revised: 08/26/2015 Document Reviewed: 04/24/2014 Elsevier Interactive Patient Education  2017 Elsevier Inc.  
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
## 2016-02-29 NOTE — Progress Notes (Signed)
Pre visit review using our clinic review tool, if applicable. No additional management support is needed unless otherwise documented below in the visit note. 

## 2016-03-01 ENCOUNTER — Other Ambulatory Visit: Payer: Self-pay | Admitting: Family Medicine

## 2016-03-02 DIAGNOSIS — M25512 Pain in left shoulder: Secondary | ICD-10-CM | POA: Insufficient documentation

## 2016-03-02 NOTE — Assessment & Plan Note (Addendum)
Muscle relaxer Ice/ heat Refer to ortho rto prn Sling

## 2016-03-03 ENCOUNTER — Other Ambulatory Visit (INDEPENDENT_AMBULATORY_CARE_PROVIDER_SITE_OTHER): Payer: Medicare Other

## 2016-03-03 DIAGNOSIS — E785 Hyperlipidemia, unspecified: Secondary | ICD-10-CM | POA: Diagnosis not present

## 2016-03-03 DIAGNOSIS — Z0000 Encounter for general adult medical examination without abnormal findings: Secondary | ICD-10-CM

## 2016-03-03 LAB — COMPREHENSIVE METABOLIC PANEL
ALT: 24 U/L (ref 0–35)
AST: 19 U/L (ref 0–37)
Albumin: 4.6 g/dL (ref 3.5–5.2)
Alkaline Phosphatase: 67 U/L (ref 39–117)
BILIRUBIN TOTAL: 0.4 mg/dL (ref 0.2–1.2)
BUN: 15 mg/dL (ref 6–23)
CHLORIDE: 103 meq/L (ref 96–112)
CO2: 29 meq/L (ref 19–32)
CREATININE: 0.78 mg/dL (ref 0.40–1.20)
Calcium: 9.9 mg/dL (ref 8.4–10.5)
GFR: 96.1 mL/min (ref >60.00)
GLUCOSE: 97 mg/dL (ref 70–99)
Potassium: 3.9 mEq/L (ref 3.5–5.1)
Sodium: 138 mEq/L (ref 135–145)
Total Protein: 8.1 g/dL (ref 6.0–8.3)

## 2016-03-03 LAB — LIPID PANEL
CHOL/HDL RATIO: 4
Cholesterol: 215 mg/dL — ABNORMAL HIGH (ref 0–200)
HDL: 59.3 mg/dL (ref >39.00)
LDL CALC: 138 mg/dL — AB (ref 0–99)
NONHDL: 155.71
Triglycerides: 89 mg/dL (ref 0.0–149.0)
VLDL: 17.8 mg/dL (ref 0.0–40.0)

## 2016-03-03 LAB — CBC
HCT: 40.5 % (ref 36.0–46.0)
Hemoglobin: 13.7 g/dL (ref 12.0–15.0)
MCHC: 33.9 g/dL (ref 30.0–36.0)
MCV: 94.9 fl (ref 78.0–100.0)
Platelets: 280 10*3/uL (ref 150.0–400.0)
RBC: 4.27 Mil/uL (ref 3.87–5.11)
RDW: 13.2 % (ref 11.5–15.5)
WBC: 5.4 10*3/uL (ref 4.0–10.5)

## 2016-03-13 ENCOUNTER — Other Ambulatory Visit: Payer: Self-pay | Admitting: Family Medicine

## 2016-03-13 DIAGNOSIS — E785 Hyperlipidemia, unspecified: Secondary | ICD-10-CM

## 2016-06-05 ENCOUNTER — Encounter: Payer: Self-pay | Admitting: Family Medicine

## 2016-06-05 ENCOUNTER — Ambulatory Visit (INDEPENDENT_AMBULATORY_CARE_PROVIDER_SITE_OTHER): Payer: Medicare Other | Admitting: Family Medicine

## 2016-06-05 VITALS — BP 110/78 | HR 68 | Temp 98.3°F | Resp 16 | Ht 65.0 in | Wt 173.6 lb

## 2016-06-05 DIAGNOSIS — J302 Other seasonal allergic rhinitis: Secondary | ICD-10-CM | POA: Diagnosis not present

## 2016-06-05 DIAGNOSIS — M654 Radial styloid tenosynovitis [de Quervain]: Secondary | ICD-10-CM

## 2016-06-05 DIAGNOSIS — E785 Hyperlipidemia, unspecified: Secondary | ICD-10-CM | POA: Diagnosis not present

## 2016-06-05 DIAGNOSIS — R413 Other amnesia: Secondary | ICD-10-CM | POA: Diagnosis not present

## 2016-06-05 DIAGNOSIS — E049 Nontoxic goiter, unspecified: Secondary | ICD-10-CM | POA: Insufficient documentation

## 2016-06-05 DIAGNOSIS — E0590 Thyrotoxicosis, unspecified without thyrotoxic crisis or storm: Secondary | ICD-10-CM

## 2016-06-05 DIAGNOSIS — J301 Allergic rhinitis due to pollen: Secondary | ICD-10-CM

## 2016-06-05 HISTORY — DX: Radial styloid tenosynovitis (de quervain): M65.4

## 2016-06-05 HISTORY — DX: Hyperlipidemia, unspecified: E78.5

## 2016-06-05 LAB — LIPID PANEL
CHOL/HDL RATIO: 3
Cholesterol: 190 mg/dL (ref 0–200)
HDL: 57.5 mg/dL (ref >39.00)
LDL Cholesterol: 117 mg/dL — ABNORMAL HIGH (ref 0–99)
NONHDL: 132.11
Triglycerides: 74 mg/dL (ref 0.0–149.0)
VLDL: 14.8 mg/dL (ref 0.0–40.0)

## 2016-06-05 LAB — COMPREHENSIVE METABOLIC PANEL
ALT: 33 U/L (ref 0–35)
AST: 20 U/L (ref 0–37)
Albumin: 4.6 g/dL (ref 3.5–5.2)
Alkaline Phosphatase: 66 U/L (ref 39–117)
BUN: 20 mg/dL (ref 6–23)
CHLORIDE: 102 meq/L (ref 96–112)
CO2: 31 meq/L (ref 19–32)
Calcium: 9.9 mg/dL (ref 8.4–10.5)
Creatinine, Ser: 0.8 mg/dL (ref 0.40–1.20)
GFR: 93.25 mL/min (ref >60.00)
GLUCOSE: 100 mg/dL — AB (ref 70–99)
POTASSIUM: 4.2 meq/L (ref 3.5–5.1)
Sodium: 138 mEq/L (ref 135–145)
Total Bilirubin: 0.5 mg/dL (ref 0.2–1.2)
Total Protein: 7.9 g/dL (ref 6.0–8.3)

## 2016-06-05 LAB — TSH: TSH: 1.01 u[IU]/mL (ref 0.35–4.50)

## 2016-06-05 LAB — T4, FREE: Free T4: 1.26 ng/dL (ref 0.60–1.60)

## 2016-06-05 LAB — T3, FREE: T3, Free: 3.6 pg/mL (ref 2.3–4.2)

## 2016-06-05 MED ORDER — FLUTICASONE PROPIONATE 50 MCG/ACT NA SUSP: 2.0000 | Freq: Every day | NASAL | 6 refills | Status: DC

## 2016-06-05 MED ORDER — LEVOCETIRIZINE DIHYDROCHLORIDE 5 MG PO TABS: 5.0000 mg | ORAL_TABLET | Freq: Every evening | ORAL | 11 refills | Status: DC

## 2016-06-05 MED ORDER — PREDNISONE 10 MG PO TABS
ORAL_TABLET | ORAL | 0 refills | Status: DC
Start: 2016-06-05 — End: 2016-07-24

## 2016-06-05 NOTE — Assessment & Plan Note (Signed)
Pt mmse at cpe was 30/30 but pt is concerned and would like further testing

## 2016-06-05 NOTE — Assessment & Plan Note (Signed)
Check thyroid panal Check US thyroid

## 2016-06-05 NOTE — Assessment & Plan Note (Signed)
Splint pred taper Hand surgeon if no relief
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
## 2016-06-05 NOTE — Patient Instructions (Signed)
Goiter A goiter is an enlarged thyroid gland. The thyroid gland is located in the lower front of the neck. The gland produces hormones that regulate mood, body temperature, pulse rate, and digestion. Most goiters are painless and are not a cause for serious concern. Goiters and conditions that cause goiters can be treated, if necessary. What are the causes? Causes of this condition include:  Diseases that attack healthy cells in your body (autoimmune diseases) and affect your thyroid function, such as:  Graves disease. This causes too much thyroid hormone to be produced and it makes your thyroid overly active (hyperthyroidism).  Hashimoto disease. This type of inflammation of the thyroid (thyroiditis) causes too little thyroid hormone to be produced and it makes your thyroid not active enough (hypothyroidism).  Other conditions that cause thyroiditis.  Nodular goiter. This means that there are one or more small growths on your thyroid. These can create too much thyroid hormone.  Pregnancy.  Thyroid cancer. This is rare.  Certain medicines.  Radiation exposure.  Iodine deficiency. In some cases, the cause may not be known (idiopathic). What increases the risk? This condition is more likely to develop in:  People who have a family history of goiter.  Women.  People who do not get enough iodine in their diet.  People who are older than 39.  People who smoke tobacco. What are the signs or symptoms? Common symptoms of this condition include:  Swelling in the lower part of the neck. This swelling can range from a very small bump to a large lump.  A tight feeling in the throat.  A hoarse voice. Other symptoms include:  Coughing.  Wheezing.  Difficulty swallowing.  Difficulty breathing.  Bulging neck veins.  Dizziness. In some cases, there are no symptoms and thyroid hormone levels may be normal. When a goiter is the result of hyperthyroidism, symptoms may also  include:  Nervousness or restlessness.  Inability to tolerate heat.  Unexplained weight loss.  Diarrhea.  Change in the texture of hair or skin.  Changes in heart beat, such as skipped beats, extra beats, or a rapid heart rate.  Loss of menstruation.  Shaky hands.  Increased appetite.  Sleep problems. When a goiter is the result of hypothyroidism, symptoms may also include:  Feeling like you have no energy (lethargy).  Inability to tolerate cold.  Weight gain that is not explained by a change in diet or exercise habits.  Dry skin.  Coarse hair.  Menstrual irregularity.  Constipation.  Sadness or depression. How is this diagnosed? This condition may be diagnosed with a medical history and physical exam. You may also have other tests, including:  Blood tests to check thyroid function.  Imaging tests, such as:  Ultrasonography.  CT scan.  MRI.  Thyroid scan. You will be given a safe radioactive injection, then images will be taken of your thyroid.  Tissue sample (biopsy) of the goiter or any nodules. This checks to see if the goiter or nodules are cancerous. How is this treated? Treatment for this condition depends on the cause. Treatment may include:  Medicines to control your thyroid.  Anti-inflammatory or steroid medicines, if inflammation is the cause.  Iodine supplements or changes in diet, if the goiter is caused by iodine deficiency.  Radiation therapy.  Surgery to remove your thyroid. In some cases, no treatment is necessary, and your health care provider will monitor your condition at regular checkups. Follow these instructions at home:  Follow recommendations from your health care  provider for any changes to your diet.  Take over-the-counter and prescription medicines only as told by your health care provider.  Do not use any tobacco products, including cigarettes, chewing tobacco, or e-cigarettes. If you need help quitting, ask your  health care provider.  Keep all follow-up appointments as told by your health care provider. This is important. Contact a health care provider if:  Your symptoms do not get better with treatment. Get help right away if:  You develop sudden, unexplained confusion or other mental changes.  You have nausea, vomiting, or diarrhea.  You develop a fever.  Your skin or the whites of your eyes appear yellow (jaundice).  You develop chest pain.  You have trouble breathing or swallowing.  You suddenly become very weak.  You experience extreme restlessness. This information is not intended to replace advice given to you by your health care provider. Make sure you discuss any questions you have with your health care provider. Document Released: 06/19/2009 Document Revised: 07/20/2015 Document Reviewed: 12/26/2013 Elsevier Interactive Patient Education  2017 [employer].

## 2016-06-05 NOTE — Assessment & Plan Note (Signed)
Encouraged heart healthy diet, increase exercise, avoid trans fats, consider a krill oil cap daily 

## 2016-06-06 ENCOUNTER — Other Ambulatory Visit: Payer: Self-pay | Admitting: Family Medicine

## 2016-06-06 ENCOUNTER — Ambulatory Visit (HOSPITAL_BASED_OUTPATIENT_CLINIC_OR_DEPARTMENT_OTHER)
Admission: RE | Admit: 2016-06-06 | Discharge: 2016-06-06 | Disposition: A | Discharge status: Home / Self Care | Payer: Medicare Other | Source: Ambulatory Visit | Attending: Family Medicine | Admitting: Family Medicine

## 2016-06-06 DIAGNOSIS — E049 Nontoxic goiter, unspecified: Secondary | ICD-10-CM | POA: Insufficient documentation

## 2016-06-06 DIAGNOSIS — E041 Nontoxic single thyroid nodule: Secondary | ICD-10-CM

## 2016-06-06 DIAGNOSIS — E010 Iodine-deficiency related diffuse (endemic) goiter: Secondary | ICD-10-CM | POA: Diagnosis not present

## 2016-06-06 DIAGNOSIS — E042 Nontoxic multinodular goiter: Secondary | ICD-10-CM | POA: Diagnosis not present

## 2016-06-13 ENCOUNTER — Telehealth: Payer: Self-pay | Admitting: Family Medicine

## 2016-06-13 NOTE — Telephone Encounter (Signed)
Caller name: Relationship to patient: Self Can be reached: [phone] ( Pharmacy:  Reason for call: Patient request call back to discuss lab results

## 2016-06-17 ENCOUNTER — Ambulatory Visit
Admission: RE | Admit: 2016-06-17 | Discharge: 2016-06-17 | Disposition: A | Discharge status: Home / Self Care | Payer: Medicare Other | Source: Ambulatory Visit | Attending: Family Medicine | Admitting: Family Medicine

## 2016-06-17 ENCOUNTER — Other Ambulatory Visit (HOSPITAL_COMMUNITY)
Admission: RE | Admit: 2016-06-17 | Discharge: 2016-06-17 | Disposition: A | Discharge status: Home / Self Care | Payer: Medicare Other | Source: Ambulatory Visit | Attending: Student | Admitting: Student

## 2016-06-17 DIAGNOSIS — E041 Nontoxic single thyroid nodule: Secondary | ICD-10-CM

## 2016-06-20 DIAGNOSIS — H16223 Keratoconjunctivitis sicca, not specified as Sjogren's, bilateral: Secondary | ICD-10-CM | POA: Diagnosis not present

## 2016-06-25 ENCOUNTER — Other Ambulatory Visit (INDEPENDENT_AMBULATORY_CARE_PROVIDER_SITE_OTHER): Payer: Medicare Other

## 2016-06-25 DIAGNOSIS — E785 Hyperlipidemia, unspecified: Secondary | ICD-10-CM | POA: Diagnosis not present

## 2016-06-25 LAB — COMPREHENSIVE METABOLIC PANEL
ALBUMIN: 4.5 g/dL (ref 3.5–5.2)
ALK PHOS: 69 U/L (ref 39–117)
ALT: 22 U/L (ref 0–35)
AST: 16 U/L (ref 0–37)
BUN: 23 mg/dL (ref 6–23)
CALCIUM: 10.2 mg/dL (ref 8.4–10.5)
CHLORIDE: 104 meq/L (ref 96–112)
CO2: 30 mEq/L (ref 19–32)
CREATININE: 1.01 mg/dL (ref 0.40–1.20)
GFR: 71.25 mL/min (ref >60.00)
Glucose, Bld: 99 mg/dL (ref 70–99)
POTASSIUM: 3.7 meq/L (ref 3.5–5.1)
Sodium: 140 mEq/L (ref 135–145)
TOTAL PROTEIN: 7.9 g/dL (ref 6.0–8.3)
Total Bilirubin: 0.4 mg/dL (ref 0.2–1.2)

## 2016-06-25 LAB — LIPID PANEL
CHOLESTEROL: 194 mg/dL (ref 0–200)
HDL: 57.9 mg/dL (ref >39.00)
LDL CALC: 119 mg/dL — AB (ref 0–99)
NonHDL: 135.65
TRIGLYCERIDES: 83 mg/dL (ref 0.0–149.0)
Total CHOL/HDL Ratio: 3
VLDL: 16.6 mg/dL (ref 0.0–40.0)

## 2016-07-24 ENCOUNTER — Encounter: Payer: Self-pay | Admitting: Family Medicine

## 2016-07-24 ENCOUNTER — Ambulatory Visit (INDEPENDENT_AMBULATORY_CARE_PROVIDER_SITE_OTHER): Payer: Medicare Other | Admitting: Family Medicine

## 2016-07-24 VITALS — BP 111/68 | HR 79 | Temp 98.8°F | Resp 16 | Ht 65.0 in | Wt 176.2 lb

## 2016-07-24 DIAGNOSIS — M79641 Pain in right hand: Secondary | ICD-10-CM

## 2016-07-24 DIAGNOSIS — R112 Nausea with vomiting, unspecified: Secondary | ICD-10-CM | POA: Diagnosis not present

## 2016-07-24 MED ORDER — RANITIDINE HCL 150 MG PO TABS: 150.0000 mg | ORAL_TABLET | Freq: Two times a day (BID) | ORAL | 5 refills | Status: DC
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
## 2016-07-24 NOTE — Patient Instructions (Signed)
Gastritis, Adult Gastritis is inflammation of the stomach. There are two kinds of gastritis:  Acute gastritis. This kind develops suddenly.  Chronic gastritis. This kind lasts for a long time.  Gastritis happens when the lining of the stomach becomes weak or gets damaged. Without treatment, gastritis can lead to stomach bleeding and ulcers. What are the causes? This condition may be caused by:  An infection.  Drinking too much alcohol.  Certain medicines.  Having too much acid in the stomach.  A disease of the intestines or stomach.  Stress.  What are the signs or symptoms? Symptoms of this condition include:  Pain or a burning in the upper abdomen.  Nausea.  Vomiting.  An uncomfortable feeling of fullness after eating.  In some cases, there are no symptoms. How is this diagnosed? This condition may be diagnosed with:  A description of your symptoms.  A physical exam.  Tests. These can include: ? Blood tests. ? Stool tests. ? A test in which a thin, flexible instrument with a light and camera on the end is passed down the esophagus and into the stomach (upper endoscopy). ? A test in which a sample of tissue is taken for testing (biopsy).  How is this treated? This condition may be treated with medicines. If the condition is caused by a bacterial infection, you may be given antibiotic medicines. If it is caused by too much acid in the stomach, you may get medicines called H2 blockers, proton pump inhibitors, or antacids. Treatment may also involve stopping the use of certain medicines, such as aspirin, ibuprofen, or other nonsteroidal anti-inflammatory drugs (NSAIDs). Follow these instructions at home:  Take over-the-counter and prescription medicines only as told by your health care provider.  If you were prescribed an antibiotic, take it as told by your health care provider. Do not stop taking the antibiotic even if you start to feel better.  Drink enough  fluid to keep your urine clear or pale yellow.  Eat small, frequent meals instead of large meals. Contact a health care provider if:  Your symptoms get worse.  Your symptoms return after treatment. Get help right away if:  You vomit blood or material that looks like coffee grounds.  You have black or dark red stools.  You are unable to keep fluids down.  Your abdominal pain gets worse.  You have a fever.  You do not feel better after 1 week. This information is not intended to replace advice given to you by your health care provider. Make sure you discuss any questions you have with your health care provider. Document Released: 12/24/2000 Document Revised: 08/29/2015 Document Reviewed: 09/23/2014 Elsevier Interactive Patient Education  2018 [employer] Inc[employer]  

## 2016-07-27 DIAGNOSIS — M79641 Pain in right hand: Secondary | ICD-10-CM | POA: Insufficient documentation

## 2016-07-27 DIAGNOSIS — R112 Nausea with vomiting, unspecified: Secondary | ICD-10-CM | POA: Insufficient documentation

## 2016-07-27 NOTE — Assessment & Plan Note (Signed)
To GI if symptoms do not improve Probably gerd

## 2016-07-27 NOTE — Assessment & Plan Note (Signed)
Consider ortho if no improvement--- pt has had carpal tunnel release--- f/u orth  Splints

## 2016-07-30 ENCOUNTER — Telehealth: Payer: Self-pay | Admitting: *Deleted
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
## 2016-07-30 NOTE — Telephone Encounter (Signed)
AWV with Glenard Haring @1 

## 2016-07-31 ENCOUNTER — Encounter: Payer: Self-pay | Admitting: Family Medicine

## 2016-07-31 ENCOUNTER — Other Ambulatory Visit: Payer: Self-pay | Admitting: Family Medicine

## 2016-07-31 ENCOUNTER — Ambulatory Visit (INDEPENDENT_AMBULATORY_CARE_PROVIDER_SITE_OTHER): Payer: Medicare Other | Admitting: Family Medicine

## 2016-07-31 VITALS — BP 105/64 | HR 72 | Temp 98.0°F | Ht 65.0 in | Wt 175.8 lb

## 2016-07-31 DIAGNOSIS — E785 Hyperlipidemia, unspecified: Secondary | ICD-10-CM

## 2016-07-31 DIAGNOSIS — R600 Localized edema: Secondary | ICD-10-CM

## 2016-07-31 DIAGNOSIS — Z0000 Encounter for general adult medical examination without abnormal findings: Secondary | ICD-10-CM | POA: Diagnosis not present

## 2016-07-31 DIAGNOSIS — Z780 Asymptomatic menopausal state: Secondary | ICD-10-CM

## 2016-07-31 DIAGNOSIS — H9209 Otalgia, unspecified ear: Secondary | ICD-10-CM | POA: Diagnosis not present

## 2016-07-31 DIAGNOSIS — Z1231 Encounter for screening mammogram for malignant neoplasm of breast: Secondary | ICD-10-CM

## 2016-07-31 MED ORDER — HYDROCHLOROTHIAZIDE 25 MG PO TABS: 25.0000 mg | ORAL_TABLET | Freq: Every day | ORAL | 3 refills | Status: DC | PRN

## 2016-07-31 MED ORDER — TRIAMCINOLONE ACETONIDE 0.1 % EX CREA: 1.0000 "application " | TOPICAL_CREAM | Freq: Two times a day (BID) | CUTANEOUS | 0 refills | Status: DC

## 2016-07-31 MED ORDER — NEOMYCIN-POLYMYXIN-HC 3.5-10000-1 OT SOLN: 3.0000 [drp] | Freq: Four times a day (QID) | OTIC | 0 refills | Status: DC

## 2016-07-31 NOTE — Patient Instructions (Signed)
Kimberly Escobar , Thank you for taking time to come for your Medicare Wellness Visit. I appreciate your ongoing commitment to your health goals. Please review the following plan we discussed and let me know if I can assist you in the future.   These are the goals we discussed: Goals      Patient Stated   . Take piano lessons (pt-stated)       This is a list of the screening recommended for you and due dates:  Health Maintenance  Topic Date Due  .  Hepatitis C: One time screening is recommended by Center for Disease Control  (CDC) for  adults born from [birth year] through 1967.   [birth year]  . HIV Screening  09/14/1968  . Flu Shot  08/13/2016  . Pap Smear  05/28/2017  . Mammogram  08/15/2017  . Tetanus Vaccine  11/16/2021  . Colon Cancer Screening  06/29/2022   Continue to eat heart healthy diet (full of fruits, vegetables, whole grains, lean protein, water--limit salt, fat, and sugar intake) and increase physical activity as tolerated.  Continue doing brain stimulating activities (puzzles, reading, adult coloring books, staying active) to keep memory sharp.   I ordered your bone density scan today. They will call you to schedule.    Health Maintenance for Postmenopausal Women Menopause is a normal process in which your reproductive ability comes to an end. This process happens gradually over a span of months to years, usually between the ages of 92 and 42. Menopause is complete when you have missed 12 consecutive menstrual periods. It is important to talk with your health care provider about some of the most common conditions that affect postmenopausal women, such as heart disease, cancer, and bone loss (osteoporosis). Adopting a healthy lifestyle and getting preventive care can help to promote your health and wellness. Those actions can also lower your chances of developing some of these common conditions. What should I know about menopause? During menopause, you may experience a number of  symptoms, such as:  Moderate-to-severe hot flashes.  Night sweats.  Decrease in sex drive.  Mood swings.  Headaches.  Tiredness.  Irritability.  Memory problems.  Insomnia.  Choosing to treat or not to treat menopausal changes is an individual decision that you make with your health care provider. What should I know about hormone replacement therapy and supplements? Hormone therapy products are effective for treating symptoms that are associated with menopause, such as hot flashes and night sweats. Hormone replacement carries certain risks, especially as you become older. If you are thinking about using estrogen or estrogen with progestin treatments, discuss the benefits and risks with your health care provider. What should I know about heart disease and stroke? Heart disease, heart attack, and stroke become more likely as you age. This may be due, in part, to the hormonal changes that your body experiences during menopause. These can affect how your body processes dietary fats, triglycerides, and cholesterol. Heart attack and stroke are both medical emergencies. There are many things that you can do to help prevent heart disease and stroke:  Have your blood pressure checked at least every 1-2 years. High blood pressure causes heart disease and increases the risk of stroke.  If you are 57-19 years old, ask your health care provider if you should take aspirin to prevent a heart attack or a stroke.  Do not use any tobacco products, including cigarettes, chewing tobacco, or electronic cigarettes. If you need help quitting, ask your health care provider.  It is important to eat a healthy diet and maintain a healthy weight. ? Be sure to include plenty of vegetables, fruits, low-fat dairy products, and lean protein. ? Avoid eating foods that are high in solid fats, added sugars, or salt (sodium).  Get regular exercise. This is one of the most important things that you can do for your  health. ? Try to exercise for at least 150 minutes each week. The type of exercise that you do should increase your heart rate and make you sweat. This is known as moderate-intensity exercise. ? Try to do strengthening exercises at least twice each week. Do these in addition to the moderate-intensity exercise.  Know your numbers.Ask your health care provider to check your cholesterol and your blood glucose. Continue to have your blood tested as directed by your health care provider.  What should I know about cancer screening? There are several types of cancer. Take the following steps to reduce your risk and to catch any cancer development as early as possible. Breast Cancer  Practice breast self-awareness. ? This means understanding how your breasts normally appear and feel. ? It also means doing regular breast self-exams. Let your health care provider know about any changes, no matter how small.  If you are 87 or older, have a clinician do a breast exam (clinical breast exam or CBE) every year. Depending on your age, family history, and medical history, it may be recommended that you also have a yearly breast X-ray (mammogram).  If you have a family history of breast cancer, talk with your health care provider about genetic screening.  If you are at high risk for breast cancer, talk with your health care provider about having an MRI and a mammogram every year.  Breast cancer (BRCA) gene test is recommended for women who have family members with BRCA-related cancers. Results of the assessment will determine the need for genetic counseling and BRCA1 and for BRCA2 testing. BRCA-related cancers include these types: ? Breast. This occurs in males or females. ? Ovarian. ? Tubal. This may also be called fallopian tube cancer. ? Cancer of the abdominal or pelvic lining (peritoneal cancer). ? Prostate. ? Pancreatic.  Cervical, Uterine, and Ovarian Cancer Your health care provider may recommend  that you be screened regularly for cancer of the pelvic organs. These include your ovaries, uterus, and vagina. This screening involves a pelvic exam, which includes checking for microscopic changes to the surface of your cervix (Pap test).  For women ages 21-65, health care providers may recommend a pelvic exam and a Pap test every three years. For women ages 57-65, they may recommend the Pap test and pelvic exam, combined with testing for human papilloma virus (HPV), every five years. Some types of HPV increase your risk of cervical cancer. Testing for HPV may also be done on women of any age who have unclear Pap test results.  Other health care providers may not recommend any screening for nonpregnant women who are considered low risk for pelvic cancer and have no symptoms. Ask your health care provider if a screening pelvic exam is right for you.  If you have had past treatment for cervical cancer or a condition that could lead to cancer, you need Pap tests and screening for cancer for at least 20 years after your treatment. If Pap tests have been discontinued for you, your risk factors (such as having a new sexual partner) need to be reassessed to determine if you should start having screenings  again. Some women have medical problems that increase the chance of getting cervical cancer. In these cases, your health care provider may recommend that you have screening and Pap tests more often.  If you have a family history of uterine cancer or ovarian cancer, talk with your health care provider about genetic screening.  If you have vaginal bleeding after reaching menopause, tell your health care provider.  There are currently no reliable tests available to screen for ovarian cancer.  Lung Cancer Lung cancer screening is recommended for adults 32-3 years old who are at high risk for lung cancer because of a history of smoking. A yearly low-dose CT scan of the lungs is recommended if you:  Currently  smoke.  Have a history of at least 30 pack-years of smoking and you currently smoke or have quit within the past 15 years. A pack-year is smoking an average of one pack of cigarettes per day for one year.  Yearly screening should:  Continue until it has been 15 years since you quit.  Stop if you develop a health problem that would prevent you from having lung cancer treatment.  Colorectal Cancer  This type of cancer can be detected and can often be prevented.  Routine colorectal cancer screening usually begins at age 36 and continues through age 28.  If you have risk factors for colon cancer, your health care provider may recommend that you be screened at an earlier age.  If you have a family history of colorectal cancer, talk with your health care provider about genetic screening.  Your health care provider may also recommend using home test kits to check for hidden blood in your stool.  A small camera at the end of a tube can be used to examine your colon directly (sigmoidoscopy or colonoscopy). This is done to check for the earliest forms of colorectal cancer.  Direct examination of the colon should be repeated every 5-10 years until age 57. However, if early forms of precancerous polyps or small growths are found or if you have a family history or genetic risk for colorectal cancer, you may need to be screened more often.  Skin Cancer  Check your skin from head to toe regularly.  Monitor any moles. Be sure to tell your health care provider: ? About any new moles or changes in moles, especially if there is a change in a mole's shape or color. ? If you have a mole that is larger than the size of a pencil eraser.  If any of your family members has a history of skin cancer, especially at a young age, talk with your health care provider about genetic screening.  Always use sunscreen. Apply sunscreen liberally and repeatedly throughout the day.  Whenever you are outside, protect  yourself by wearing long sleeves, pants, a wide-brimmed hat, and sunglasses.  What should I know about osteoporosis? Osteoporosis is a condition in which bone destruction happens more quickly than new bone creation. After menopause, you may be at an increased risk for osteoporosis. To help prevent osteoporosis or the bone fractures that can happen because of osteoporosis, the following is recommended:  If you are 37-70 years old, get at least 1,000 mg of calcium and at least 600 mg of vitamin D per day.  If you are older than age 59 but younger than age 39, get at least 1,200 mg of calcium and at least 600 mg of vitamin D per day.  If you are older than age 17,  get at least 1,200 mg of calcium and at least 800 mg of vitamin D per day.  Smoking and excessive alcohol intake increase the risk of osteoporosis. Eat foods that are rich in calcium and vitamin D, and do weight-bearing exercises several times each week as directed by your health care provider. What should I know about how menopause affects my mental health? Depression may occur at any age, but it is more common as you become older. Common symptoms of depression include:  Low or sad mood.  Changes in sleep patterns.  Changes in appetite or eating patterns.  Feeling an overall lack of motivation or enjoyment of activities that you previously enjoyed.  Frequent crying spells.  Talk with your health care provider if you think that you are experiencing depression. What should I know about immunizations? It is important that you get and maintain your immunizations. These include:  Tetanus, diphtheria, and pertussis (Tdap) booster vaccine.  Influenza every year before the flu season begins.  Pneumonia vaccine.  Shingles vaccine.  Your health care provider may also recommend other immunizations. This information is not intended to replace advice given to you by your health care provider. Make sure you discuss any questions you  have with your health care provider. Document Released: 02/21/2005 Document Revised: 07/20/2015 Document Reviewed: 10/03/2014 Elsevier Interactive Patient Education  2018 [employer].

## 2016-07-31 NOTE — Progress Notes (Signed)
Subjective:     Kimberly Escobar is a 61 y.o. female and is here for a comprehensive physical exam. The patient reports no problems.  Social History   Social History  . Marital status: Married    Spouse name: N/A  . Number of children: 4  . Years of education: N/A   Occupational History  . housewife Other   Social History Main Topics  . Smoking status: Never Smoker  . Smokeless tobacco: Never Used  . Alcohol use 1.8 oz/week    3 Glasses of wine per week  . Drug use: No  . Sexual activity: Yes    Partners: Male   Other Topics Concern  . Not on file   Social History Narrative   Exercise --no secondary to plantar fascitis   Health Maintenance  Topic Date Due  . Hepatitis C Screening  01/10/1956  . HIV Screening  09/14/1968  . INFLUENZA VACCINE  08/13/2016  . PAP SMEAR  05/28/2017  . MAMMOGRAM  08/15/2017  . TETANUS/TDAP  11/16/2021  . COLONOSCOPY  06/29/2022    The following portions of the patient's history were reviewed and updated as appropriate:  She  has a past medical history of Acute costochondritis; Arthritis; Back pain; Carpal tunnel syndrome of right wrist; Environmental allergies; Migraines; Plantar fasciitis; and Wears glasses. She  does not have any pertinent problems on file. She  has a past surgical history that includes Cesarean section; Colonoscopy; Upper gastrointestinal endoscopy; Tubal ligation; and Carpal tunnel release (Right, 02/02/2014). Her family history includes Arthritis in her unknown relative; Breast cancer (age of onset: 40) in her sister; Heart disease in her mother and sister; Hypertension in her mother and sister; Stroke (age of onset: 61) in her sister. She  reports that she has never smoked. She has never used smokeless tobacco. She reports that she drinks about 1.8 oz of alcohol per week . She reports that she does not use drugs. She has a current medication list which includes the following prescription(s): acyclovir ointment, aspirin,  cyclobenzaprine, evening primrose oil, fluticasone, hydrochlorothiazide, ibuprofen, meloxicam, multivitamin-lutein, nutritional supplements, pyridoxine hcl, ranitidine, rizatriptan, tramadol, acyclovir, neomycin-polymyxin-hydrocortisone, and triamcinolone cream. Current Outpatient Prescriptions on File Prior to Visit  Medication Sig Dispense Refill  . acyclovir ointment (ZOVIRAX) 5 % APPLY TOPICALLY EVERY 3 HOURS AS DIRECTED 15 g 0  . aspirin 81 MG tablet Take 81 mg by mouth daily.    . cyclobenzaprine (FLEXERIL) 10 MG tablet Take 1 tablet (10 mg total) by mouth 3 (three) times daily as needed for muscle spasms. 30 tablet 0  . EVENING PRIMROSE OIL PO Take 1 capsule by mouth 2 (two) times daily before a meal. 1300 mg twice a day for hot flashes    . fluticasone (FLONASE) 50 MCG/ACT nasal spray Place 2 sprays into both nostrils daily. 16 g 6  . ibuprofen (ADVIL,MOTRIN) 800 MG tablet     . meloxicam (MOBIC) 15 MG tablet TAKE 1/2 TO 1 TABLET BY MOUTH EVERY DAY 90 tablet 0  . multivitamin-lutein (OCUVITE-LUTEIN) CAPS Take 1 capsule by mouth daily.    . Nutritional Supplements (GRAPESEED EXTRACT PO) Take 1 tablet by mouth daily.    . Pyridoxine HCl (VITAMIN B6 PO) Take 2 tablets by mouth daily. Reported on 06/14/2015    . ranitidine (ZANTAC) 150 MG tablet Take 1 tablet (150 mg total) by mouth 2 (two) times daily. 60 tablet 5  . rizatriptan (MAXALT) 10 MG tablet Take 1 tablet (10 mg total) by mouth as  needed for migraine. May repeat in 2 hours if needed 10 tablet 0  . traMADol (ULTRAM) 50 MG tablet Take 1 tablet (50 mg total) by mouth every 6 (six) hours as needed. Maximum dose= 8 tablets per day 30 tablet 1  . acyclovir (ZOVIRAX) 400 MG tablet TAKE 1 TABLET BY MOUTH TWICE DAILY 60 tablet 11   No current facility-administered medications on file prior to visit.    She is allergic to codeine..  Review of Systems Review of Systems  Constitutional: Negative for activity change, appetite change and  fatigue.  HENT: Negative for hearing loss, congestion, tinnitus and ear discharge.  dentist q2m Eyes: Negative for visual disturbance (see optho q1y -- vision corrected to 20/20 with glasses).  Respiratory: Negative for cough, chest tightness and shortness of breath.   Cardiovascular: Negative for chest pain, palpitations and leg swelling.  Gastrointestinal: Negative for abdominal pain, diarrhea, constipation and abdominal distention.  Genitourinary: Negative for urgency, frequency, decreased urine volume and difficulty urinating.  Musculoskeletal: Negative for back pain, arthralgias and gait problem.  Skin: Negative for color change, pallor and rash.  Neurological: Negative for dizziness, light-headedness, numbness and headaches.  Hematological: Negative for adenopathy. Does not bruise/bleed easily.  Psychiatric/Behavioral: Negative for suicidal ideas, confusion, sleep disturbance, self-injury, dysphoric mood, decreased concentration and agitation.       Objective:    BP 105/64 (BP Location: Right Arm, Patient Position: Sitting, Cuff Size: Large)   Pulse 72   Temp 98 F (36.7 C) (Oral)   Ht 5\' 5"  (1.651 m)   Wt 175 lb 12.8 oz (79.7 kg)   SpO2 98%   BMI 29.25 kg/m  General appearance: alert, cooperative, appears stated age and no distress Head: Normocephalic, without obvious abnormality, atraumatic Eyes: conjunctivae/corneas clear. PERRL, EOM's intact. Fundi benign. Ears: normal TM's and external ear canals both ears Nose: Nares normal. Septum midline. Mucosa normal. No drainage or sinus tenderness. Throat: lips, mucosa, and tongue normal; teeth and gums normal Neck: no adenopathy, no carotid bruit, no JVD, supple, symmetrical, trachea midline and thyroid not enlarged, symmetric, no tenderness/mass/nodules Back: symmetric, no curvature. ROM normal. No CVA tenderness. Lungs: clear to auscultation bilaterally Breasts: normal appearance, no masses or tenderness Heart: regular rate  and rhythm, S1, S2 normal, no murmur, click, rub or gallop Abdomen: soft, non-tender; bowel sounds normal; no masses,  no organomegaly Pelvic: not indicated; post-menopausal, no abnormal Pap smears in past Extremities: extremities normal, atraumatic, no cyanosis or edema Pulses: 2+ and symmetric Skin: Skin color, texture, turgor normal. No rashes or lesions Lymph nodes: Cervical, supraclavicular, and axillary nodes normal. Neurologic: Alert and oriented X 3, normal strength and tone. Normal symmetric reflexes. Normal coordination and gait    Assessment:    Healthy female exam.      Plan:    ghm utd Check labs shingrix d/w pt  See After Visit Summary for Counseling Recommendations    1. Postmenopausal  - DG Bone Density; Future  2. Encounter for Medicare annual wellness exam   3. Bilateral edema of lower extremity  - hydrochlorothiazide (HYDRODIURIL) 25 MG tablet; Take 1 tablet (25 mg total) by mouth daily as needed.  Dispense: 90 tablet; Refill: 3  4. Otalgia, unspecified laterality  - neomycin-polymyxin-hydrocortisone (CORTISPORIN) OTIC solution; Place 3 drops into both ears 4 (four) times daily.  Dispense: 10 mL; Refill: 0  5. Hyperlipidemia, unspecified hyperlipidemia type Tolerating statin, encouraged heart healthy diet, avoid trans fats, minimize simple carbs and saturated fats. Increase exercise as tolerated

## 2016-08-01 DIAGNOSIS — H04123 Dry eye syndrome of bilateral lacrimal glands: Secondary | ICD-10-CM | POA: Diagnosis not present

## 2016-08-03 ENCOUNTER — Encounter: Payer: Self-pay | Admitting: Family Medicine

## 2016-08-11 ENCOUNTER — Ambulatory Visit (INDEPENDENT_AMBULATORY_CARE_PROVIDER_SITE_OTHER): Payer: Self-pay | Admitting: Orthopaedic Surgery

## 2016-08-14 ENCOUNTER — Encounter (INDEPENDENT_AMBULATORY_CARE_PROVIDER_SITE_OTHER): Payer: Self-pay

## 2016-08-14 ENCOUNTER — Ambulatory Visit (INDEPENDENT_AMBULATORY_CARE_PROVIDER_SITE_OTHER): Payer: Self-pay | Admitting: Orthopaedic Surgery

## 2016-08-18 ENCOUNTER — Ambulatory Visit (HOSPITAL_BASED_OUTPATIENT_CLINIC_OR_DEPARTMENT_OTHER)
Admission: RE | Admit: 2016-08-18 | Discharge: 2016-08-18 | Disposition: A | Discharge status: Home / Self Care | Payer: Medicare Other | Source: Ambulatory Visit | Attending: Family Medicine | Admitting: Family Medicine

## 2016-08-18 ENCOUNTER — Encounter (HOSPITAL_BASED_OUTPATIENT_CLINIC_OR_DEPARTMENT_OTHER): Payer: Self-pay

## 2016-08-18 DIAGNOSIS — Z1231 Encounter for screening mammogram for malignant neoplasm of breast: Secondary | ICD-10-CM

## 2016-08-18 DIAGNOSIS — M8580 Other specified disorders of bone density and structure, unspecified site: Secondary | ICD-10-CM | POA: Insufficient documentation

## 2016-08-18 DIAGNOSIS — Z13820 Encounter for screening for osteoporosis: Secondary | ICD-10-CM | POA: Diagnosis not present

## 2016-08-18 DIAGNOSIS — M8589 Other specified disorders of bone density and structure, multiple sites: Secondary | ICD-10-CM | POA: Diagnosis not present

## 2016-08-18 DIAGNOSIS — Z780 Asymptomatic menopausal state: Secondary | ICD-10-CM | POA: Insufficient documentation

## 2016-08-19 ENCOUNTER — Telehealth: Payer: Self-pay

## 2016-08-19 NOTE — Telephone Encounter (Signed)
Patient called for resullts of Bone Density Test. Read patient results per Dr. Etter Sjogren- Cheri Rous.

## 2016-10-15 DIAGNOSIS — G5602 Carpal tunnel syndrome, left upper limb: Secondary | ICD-10-CM | POA: Diagnosis not present

## 2016-10-22 DIAGNOSIS — G5602 Carpal tunnel syndrome, left upper limb: Secondary | ICD-10-CM | POA: Diagnosis not present

## 2016-11-03 ENCOUNTER — Encounter: Payer: Self-pay | Admitting: Family Medicine

## 2016-11-03 ENCOUNTER — Ambulatory Visit (INDEPENDENT_AMBULATORY_CARE_PROVIDER_SITE_OTHER): Payer: Medicare Other | Admitting: Family Medicine

## 2016-11-03 VITALS — BP 124/78 | HR 81 | Temp 98.7°F | Ht 65.0 in | Wt 176.0 lb

## 2016-11-03 DIAGNOSIS — R600 Localized edema: Secondary | ICD-10-CM | POA: Insufficient documentation

## 2016-11-03 DIAGNOSIS — I10 Essential (primary) hypertension: Secondary | ICD-10-CM | POA: Diagnosis not present

## 2016-11-03 DIAGNOSIS — Z8619 Personal history of other infectious and parasitic diseases: Secondary | ICD-10-CM | POA: Diagnosis not present

## 2016-11-03 HISTORY — DX: Essential (primary) hypertension: I10

## 2016-11-03 MED ORDER — HYDROCHLOROTHIAZIDE 25 MG PO TABS: 25.0000 mg | ORAL_TABLET | Freq: Every day | ORAL | 3 refills | Status: DC | PRN

## 2016-11-03 MED ORDER — ACYCLOVIR 400 MG PO TABS: 400.0000 mg | ORAL_TABLET | Freq: Two times a day (BID) | ORAL | 11 refills | Status: DC
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
## 2016-11-03 NOTE — Assessment & Plan Note (Signed)
Well controlled, no changes to meds. Encouraged heart healthy diet such as the DASH diet and exercise as tolerated.  °

## 2016-11-03 NOTE — Assessment & Plan Note (Signed)
con't hctz Elevate legs  Inc water intake Compression socks

## 2016-11-03 NOTE — Patient Instructions (Signed)
Edema Edema is when you have too much fluid in your body or under your skin. Edema may make your legs, feet, and ankles swell up. Swelling is also common in looser tissues, like around your eyes. This is a common condition. It gets more common as you get older. There are many possible causes of edema. Eating too much salt (sodium) and being on your feet or sitting for a long time can cause edema in your legs, feet, and ankles. Hot weather may make edema worse. Edema is usually painless. Your skin may look swollen or shiny. Follow these instructions at home:  Keep the swollen body part raised (elevated) above the level of your heart when you are sitting or lying down.  Do not sit still or stand for a long time.  Do not wear tight clothes. Do not wear garters on your upper legs.  Exercise your legs. This can help the swelling go down.  Wear elastic bandages or support stockings as told by your doctor.  Eat a low-salt (low-sodium) diet to reduce fluid as told by your doctor.  Depending on the cause of your swelling, you may need to limit how much fluid you drink (fluid restriction).  Take over-the-counter and prescription medicines only as told by your doctor. Contact a doctor if:  Treatment is not working.  You have heart, liver, or kidney disease and have symptoms of edema.  You have sudden and unexplained weight gain. Get help right away if:  You have shortness of breath or chest pain.  You cannot breathe when you lie down.  You have pain, redness, or warmth in the swollen areas.  You have heart, liver, or kidney disease and get edema all of a sudden.  You have a fever and your symptoms get worse all of a sudden. Summary  Edema is when you have too much fluid in your body or under your skin.  Edema may make your legs, feet, and ankles swell up. Swelling is also common in looser tissues, like around your eyes.  Raise (elevate) the swollen body part above the level of your  heart when you are sitting or lying down.  Follow your doctor's instructions about diet and how much fluid you can drink (fluid restriction). This information is not intended to replace advice given to you by your health care provider. Make sure you discuss any questions you have with your health care provider. Document Released: 06/18/2007 Document Revised: 01/18/2016 Document Reviewed: 01/18/2016 Elsevier Interactive Patient Education  2017 Elsevier Inc.  

## 2016-11-04 ENCOUNTER — Encounter: Payer: Self-pay | Admitting: Family Medicine

## 2016-11-12 ENCOUNTER — Ambulatory Visit (HOSPITAL_BASED_OUTPATIENT_CLINIC_OR_DEPARTMENT_OTHER): Payer: Medicare Other

## 2017-01-07 ENCOUNTER — Ambulatory Visit: Payer: Medicare Other | Admitting: Family Medicine

## 2017-01-07 ENCOUNTER — Encounter: Payer: Self-pay | Admitting: Family Medicine

## 2017-01-07 VITALS — BP 105/68 | HR 66 | Temp 98.4°F | Resp 16 | Wt 177.0 lb

## 2017-01-07 DIAGNOSIS — R51 Headache: Secondary | ICD-10-CM

## 2017-01-07 DIAGNOSIS — L539 Erythematous condition, unspecified: Secondary | ICD-10-CM

## 2017-01-07 DIAGNOSIS — R0982 Postnasal drip: Secondary | ICD-10-CM

## 2017-01-07 DIAGNOSIS — R519 Headache, unspecified: Secondary | ICD-10-CM

## 2017-01-07 LAB — CBC
HEMATOCRIT: 39.5 % (ref 36.0–46.0)
Hemoglobin: 13.1 g/dL (ref 12.0–15.0)
MCHC: 33.1 g/dL (ref 30.0–36.0)
MCV: 96.8 fl (ref 78.0–100.0)
Platelets: 268 10*3/uL (ref 150.0–400.0)
RBC: 4.08 Mil/uL (ref 3.87–5.11)
RDW: 13.4 % (ref 11.5–15.5)
WBC: 6.5 10*3/uL (ref 4.0–10.5)

## 2017-01-07 LAB — POCT RAPID STREP A (OFFICE): RAPID STREP A SCREEN: NEGATIVE

## 2017-01-07 LAB — SEDIMENTATION RATE: SED RATE: 7 mm/h (ref 0–30)

## 2017-01-07 LAB — C-REACTIVE PROTEIN: CRP: 0.3 mg/dL — AB (ref 0.5–20.0)

## 2017-01-07 MED ORDER — TRIAMCINOLONE ACETONIDE 55 MCG/ACT NA AERO: 2.0000 | INHALATION_SPRAY | Freq: Every day | NASAL | 12 refills | Status: DC
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
## 2017-01-07 NOTE — Patient Instructions (Signed)
You do not have strep, and your ears appear normal today We are going to make sure there is no sign of a bacterial infection or temporal arteritis today- we are doing some blood work I will also have you try a nasal spray for your symptoms.  I will be in touch with your results asap- please let us know if you are getting worse or not doing ok

## 2017-02-18 ENCOUNTER — Ambulatory Visit (INDEPENDENT_AMBULATORY_CARE_PROVIDER_SITE_OTHER): Payer: Medicare Other | Admitting: Internal Medicine

## 2017-02-18 ENCOUNTER — Encounter: Payer: Self-pay | Admitting: Internal Medicine

## 2017-02-18 VITALS — BP 126/78 | HR 67 | Temp 98.8°F | Resp 14 | Ht 65.0 in | Wt 176.1 lb

## 2017-02-18 DIAGNOSIS — J111 Influenza due to unidentified influenza virus with other respiratory manifestations: Secondary | ICD-10-CM

## 2017-02-18 DIAGNOSIS — R059 Cough, unspecified: Secondary | ICD-10-CM

## 2017-02-18 DIAGNOSIS — R05 Cough: Secondary | ICD-10-CM

## 2017-02-18 LAB — POCT INFLUENZA A/B
Influenza A, POC: NEGATIVE
Influenza B, POC: NEGATIVE

## 2017-02-18 MED ORDER — OSELTAMIVIR PHOSPHATE 75 MG PO CAPS: 75.0000 mg | ORAL_CAPSULE | Freq: Two times a day (BID) | ORAL | 0 refills | Status: DC
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
## 2017-02-18 NOTE — Progress Notes (Signed)
Pre visit review using our clinic review tool, if applicable. No additional management support is needed unless otherwise documented below in the visit note. 

## 2017-02-18 NOTE — Patient Instructions (Signed)
Rst, fluids , tylenol or ibuprofen as needed  For cough:  Take Mucinex DM twice a day as needed until better  For nasal congestion: Use OTC Nasocort or Flonase : 2 nasal sprays on each side of the nose in the morning until you feel better  Take TAMIFLU  Call if not gradually better over the next  10 days  Call anytime if the symptoms are severe

## 2017-03-02 ENCOUNTER — Ambulatory Visit: Payer: Self-pay | Admitting: *Deleted

## 2017-03-02 NOTE — Telephone Encounter (Signed)
Patient is calling to make sure she is ok to wait until tomorrow for her appointment. She thinks that she may have pulled a muscle- but she is nervous about it and doesn't want to take anything strong for fear she could be wrong. Worked patient through arm pain protocol- she is negative for all emergent questions. Reviewed reasons to seek help- she states understanding. She will keep appointment tomorrow. Reason for Disposition . [1] MODERATE pain (e.g., interferes with normal activities) AND [2] present > 3 days  Answer Assessment - Initial Assessment Questions 1. ONSET: "When did the pain start?"     Saturday 2. LOCATION: "Where is the pain located?"     Upper arm- above the elbow on the left arm , some neck pain on right 3. PAIN: "How bad is the pain?" (Scale 1-10; or mild, moderate, severe)   - MILD (1-3): doesn't interfere with normal activities   - MODERATE (4-7): interferes with normal activities (e.g., work or school) or awakens from sleep   - SEVERE (8-10): excruciating pain, unable to do any normal activities, unable to hold a cup of water     Saturday- moderate, today- 2 4. WORK OR EXERCISE: "Has there been any recent work or exercise that involved this part of the body?"     Stretches- usually do them 3 times/week,she started a new sequence of stretches 5. CAUSE: "What do you think is causing the arm pain?"     Possible muscle pain  6. OTHER SYMPTOMS: "Do you have any other symptoms?" (e.g., neck pain, swelling, rash, fever, numbness, weakness)     Neck pain on opposite side- no other symptoms 7. PREGNANCY: "Is there any chance you are pregnant?" "When was your last menstrual period?"     n/a  Protocols used: ARM PAIN-A-AH

## 2017-03-03 ENCOUNTER — Ambulatory Visit (INDEPENDENT_AMBULATORY_CARE_PROVIDER_SITE_OTHER): Payer: Medicare Other | Admitting: Family Medicine

## 2017-03-03 ENCOUNTER — Encounter: Payer: Self-pay | Admitting: Family Medicine

## 2017-03-03 VITALS — BP 126/88 | HR 68 | Temp 98.5°F | Resp 16 | Ht 65.0 in | Wt 179.4 lb

## 2017-03-03 DIAGNOSIS — M79602 Pain in left arm: Secondary | ICD-10-CM | POA: Diagnosis not present
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
## 2017-03-30 ENCOUNTER — Encounter: Payer: Self-pay | Admitting: Family Medicine

## 2017-03-30 ENCOUNTER — Ambulatory Visit (INDEPENDENT_AMBULATORY_CARE_PROVIDER_SITE_OTHER): Payer: Medicare Other | Admitting: Family Medicine

## 2017-03-30 ENCOUNTER — Ambulatory Visit: Payer: Self-pay

## 2017-03-30 VITALS — BP 120/64 | HR 68 | Temp 98.5°F | Ht 65.0 in | Wt 177.1 lb

## 2017-03-30 DIAGNOSIS — G43109 Migraine with aura, not intractable, without status migrainosus: Secondary | ICD-10-CM | POA: Diagnosis not present

## 2017-03-30 MED ORDER — SUMATRIPTAN SUCCINATE 100 MG PO TABS: 100.0000 mg | ORAL_TABLET | ORAL | 0 refills | Status: DC | PRN

## 2017-03-30 MED ORDER — KETOROLAC TROMETHAMINE 60 MG/2ML IM SOLN
60.0000 mg | Freq: Once | INTRAMUSCULAR | Status: AC
  Administered 2017-03-30: 60 mg via INTRAMUSCULAR

## 2017-03-30 NOTE — Progress Notes (Signed)
Pre visit review using our clinic review tool, if applicable. No additional management support is needed unless otherwise documented below in the visit note. 

## 2017-03-30 NOTE — Telephone Encounter (Signed)
Patient called in for c/o "migraine." She says "I'm on the 2nd day of a migraine. I've taken Ibuprofen every 6 hours and it's not relieving the headache. It kept me up all night. I have some Meloxicam and wondered if I can take that. I don't have anything for migraine in the house." I advised the Meloxicam is in the same family of medicines as Ibuprofen, Aleve, so if Ibuprofen didn't work, then she will need her migraine medication filled, but she will need to be seen in the office. She agreed to be seen. I asked how severe is the migraine, she says "8, it's bad." I asked when was the last time she had a migraine, she said "I'm not sure, but I think about 6 months ago. But it got better that time." I asked about any other symptoms, she says "left shoulder pain, no nausea yet, but I feel like I'm on the verge if I move around." She denies any other symptoms. According to protocol, see PCP within 24 hours, which was edited from see within 4 hours, no available appointments with PCP, patient preferred to see a physician, appointment made today at 1115 with Dr. Nani Ravens, care advice given, patient verbalized understanding.   Reason for Disposition . [1] SEVERE headache (e.g., excruciating) AND [2] not improved after 2 hours of pain medicine  Answer Assessment - Initial Assessment Questions 1. LOCATION: "Where does it hurt?"      Left side over the eye 2. ONSET: "When did the headache start?" (Minutes, hours or days)     2 days ago 3. PATTERN: "Does the pain come and go, or has it been constant since it started?"     Constant 4. SEVERITY: "How bad is the pain?" and "What does it keep you from doing?"  (e.g., Scale 1-10; mild, moderate, or severe)   - MILD (1-3): doesn't interfere with normal activities    - MODERATE (4-7): interferes with normal activities or awakens from sleep    - SEVERE (8-10): excruciating pain, unable to do any normal activities        8 5. RECURRENT SYMPTOM: "Have you ever had  headaches before?" If so, ask: "When was the last time?" and "What happened that time?"      Yes, about 6 months ago 6. CAUSE: "What do you think is causing the headache?"     Migraine 7. MIGRAINE: "Have you been diagnosed with migraine headaches?" If so, ask: "Is this headache similar?"      Yes 8. HEAD INJURY: "Has there been any recent injury to the head?"      No 9. OTHER SYMPTOMS: "Do you have any other symptoms?" (fever, stiff neck, eye pain, sore throat, cold symptoms)     Pain to left shoulder 10. PREGNANCY: "Is there any chance you are pregnant?" "When was your last menstrual period?"       No  Protocols used: HEADACHE-A-AH
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
## 2017-03-30 NOTE — Patient Instructions (Signed)
You are receiving a shot of Toradol today. No NSAIDs for the rest of today. Tylenol is OK.  EXERCISES  RANGE OF MOTION (ROM) AND STRETCHING EXERCISES These exercises may help you when beginning to rehabilitate your injury. While completing these exercises, remember:   Restoring tissue flexibility helps normal motion to return to the joints. This allows healthier, less painful movement and activity.  An effective stretch should be held for at least 30 seconds.  A stretch should never be painful. You should only feel a gentle lengthening or release in the stretched tissue.  ROM - Pendulum  Bend at the waist so that your right / left arm falls away from your body. Support yourself with your opposite hand on a solid surface, such as a table or a countertop.  Your right / left arm should be perpendicular to the ground. If it is not perpendicular, you need to lean over farther. Relax the muscles in your right / left arm and shoulder as much as possible.  Gently sway your hips and trunk so they move your right / left arm without any use of your right / left shoulder muscles.  Progress your movements so that your right / left arm moves side to side, then forward and backward, and finally, both clockwise and counterclockwise.  Complete 10-15 repetitions in each direction. Many people use this exercise to relieve discomfort in their shoulder as well as to gain range of motion. Repeat 2 times. Complete this exercise 3 times per week.  STRETCH - Flexion, Standing  Stand with good posture. With an underhand grip on your right / left hand and an overhand grip on the opposite hand, grasp a broomstick or cane so that your hands are a little more than shoulder-width apart.  Keeping your right / left elbow straight and shoulder muscles relaxed, push the stick with your opposite hand to raise your right / left arm in front of your body and then overhead. Raise your arm until you feel a stretch in your  right / left shoulder, but before you have increased shoulder pain.  Try to avoid shrugging your right / left shoulder as your arm rises by keeping your shoulder blade tucked down and toward your mid-back spine. Hold 30 seconds.  Slowly return to the starting position. Repeat 2 times. Complete this exercise 3 times per week.  STRETCH - Internal Rotation  Place your right / left hand behind your back, palm-up.  Throw a towel or belt over your opposite shoulder. Grasp the towel/belt with your right / left hand.  While keeping an upright posture, gently pull up on the towel/belt until you feel a stretch in the front of your right / left shoulder.  Avoid shrugging your right / left shoulder as your arm rises by keeping your shoulder blade tucked down and toward your mid-back spine.  Hold 30. Release the stretch by lowering your opposite hand. Repeat 2 times. Complete this exercise 3 times per week.  STRETCH - External Rotation and Abduction  Stagger your stance through a doorframe. It does not matter which foot is forward.  As instructed by your physician, physical therapist or athletic trainer, place your hands: ? And forearms above your head and on the door frame. ? And forearms at head-height and on the door frame. ? At elbow-height and on the door frame.  Keeping your head and chest upright and your stomach muscles tight to prevent over-extending your low-back, slowly shift your weight onto your front  foot until you feel a stretch across your chest and/or in the front of your shoulders.  Hold 30 seconds. Shift your weight to your back foot to release the stretch. Repeat 2 times. Complete this stretch 3 times per week.   STRENGTHENING EXERCISES  These exercises may help you when beginning to rehabilitate your injury. They may resolve your symptoms with or without further involvement from your physician, physical therapist or athletic trainer. While completing these exercises,  remember:   Muscles can gain both the endurance and the strength needed for everyday activities through controlled exercises.  Complete these exercises as instructed by your physician, physical therapist or athletic trainer. Progress the resistance and repetitions only as guided.  You may experience muscle soreness or fatigue, but the pain or discomfort you are trying to eliminate should never worsen during these exercises. If this pain does worsen, stop and make certain you are following the directions exactly. If the pain is still present after adjustments, discontinue the exercise until you can discuss the trouble with your clinician.  If advised by your physician, during your recovery, avoid activity or exercises which involve actions that place your right / left hand or elbow above your head or behind your back or head. These positions stress the tissues which are trying to heal.  STRENGTH - Scapular Depression and Adduction  With good posture, sit on a firm chair. Supported your arms in front of you with pillows, arm rests or a table top. Have your elbows in line with the sides of your body.  Gently draw your shoulder blades down and toward your mid-back spine. Gradually increase the tension without tensing the muscles along the top of your shoulders and the back of your neck.  Hold for 3 seconds. Slowly release the tension and relax your muscles completely before completing the next repetition.  After you have practiced this exercise, remove the arm support and complete it in standing as well as sitting. Repeat 2 times. Complete this exercise 3 times per week.   STRENGTH - External Rotators  Secure a rubber exercise band/tubing to a fixed object so that it is at the same height as your right / left elbow when you are standing or sitting on a firm surface.  Stand or sit so that the secured exercise band/tubing is at your side that is not injured.  Bend your elbow 90 degrees. Place a  folded towel or small pillow under your right / left arm so that your elbow is a few inches away from your side.  Keeping the tension on the exercise band/tubing, pull it away from your body, as if pivoting on your elbow. Be sure to keep your body steady so that the movement is only coming from your shoulder rotating.  Hold 3 seconds. Release the tension in a controlled manner as you return to the starting position. Repeat 2 times. Complete this exercise 3 times per week.   STRENGTH - Supraspinatus  Stand or sit with good posture. Grasp a 2-3 lb weight or an exercise band/tubing so that your hand is "thumbs-up," like when you shake hands.  Slowly lift your right / left hand from your thigh into the air, traveling about 30 degrees from straight out at your side. Lift your hand to shoulder height or as far as you can without increasing any shoulder pain. Initially, many people do not lift their hands above shoulder height.  Avoid shrugging your right / left shoulder as your arm rises by  keeping your shoulder blade tucked down and toward your mid-back spine.  Hold for 3 seconds. Control the descent of your hand as you slowly return to your starting position. Repeat 2 times. Complete this exercise 3 times per week.   STRENGTH - Shoulder Extensors  Secure a rubber exercise band/tubing so that it is at the height of your shoulders when you are either standing or sitting on a firm arm-less chair.  With a thumbs-up grip, grasp an end of the band/tubing in each hand. Straighten your elbows and lift your hands straight in front of you at shoulder height. Step back away from the secured end of band/tubing until it becomes tense.  Squeezing your shoulder blades together, pull your hands down to the sides of your thighs. Do not allow your hands to go behind you.  Hold for 3 seconds. Slowly ease the tension on the band/tubing as you reverse the directions and return to the starting position. Repeat 2  times. Complete this exercise 3 times per week.   STRENGTH - Scapular Retractors  Secure a rubber exercise band/tubing so that it is at the height of your shoulders when you are either standing or sitting on a firm arm-less chair.  With a palm-down grip, grasp an end of the band/tubing in each hand. Straighten your elbows and lift your hands straight in front of you at shoulder height. Step back away from the secured end of band/tubing until it becomes tense.  Squeezing your shoulder blades together, draw your elbows back as you bend them. Keep your upper arm lifted away from your body throughout the exercise.  Hold 3 seconds. Slowly ease the tension on the band/tubing as you reverse the directions and return to the starting position. Repeat 2 times. Complete this exercise 3 times per week.  STRENGTH - Scapular Depressors  Find a sturdy chair without wheels, such as a from a dining room table.  Keeping your feet on the floor, lift your bottom from the seat and lock your elbows.  Keeping your elbows straight, allow gravity to pull your body weight down. Your shoulders will rise toward your ears.  Raise your body against gravity by drawing your shoulder blades down your back, shortening the distance between your shoulders and ears. Although your feet should always maintain contact with the floor, your feet should progressively support less body weight as you get stronger.  Hold 3 seconds. In a controlled and slow manner, lower your body weight to begin the next repetition. Repeat 2 times. Complete this exercise 3 times per week.   This information is not intended to replace advice given to you by your health care provider. Make sure you discuss any questions you have with your health care provider.  Document Released: 11/13/2004 Document Revised: 01/20/2014 Document Reviewed: 04/13/2008 Elsevier Interactive Patient Education [employer].

## 2017-05-08 ENCOUNTER — Ambulatory Visit (INDEPENDENT_AMBULATORY_CARE_PROVIDER_SITE_OTHER): Payer: Medicare Other | Admitting: Family

## 2017-05-08 ENCOUNTER — Encounter: Payer: Self-pay | Admitting: Family

## 2017-05-08 ENCOUNTER — Ambulatory Visit: Payer: Medicare Other | Admitting: Medical

## 2017-05-08 VITALS — BP 117/73 | HR 67 | Temp 98.5°F | Resp 16 | Ht 65.0 in | Wt 180.0 lb

## 2017-05-08 DIAGNOSIS — R21 Rash and other nonspecific skin eruption: Secondary | ICD-10-CM | POA: Diagnosis not present

## 2017-05-08 MED ORDER — PREDNISONE 10 MG PO TABS: ORAL_TABLET | ORAL | 0 refills | Status: DC
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
## 2017-05-08 NOTE — Patient Instructions (Addendum)
Please remain off of your diet supplement and avoid "jack fruit."  Begin prednisone taper. Call if rash worsens or if it does not improve. Go to the ER if you develop tongue/lip swelling.

## 2017-06-04 ENCOUNTER — Ambulatory Visit (INDEPENDENT_AMBULATORY_CARE_PROVIDER_SITE_OTHER): Payer: Medicare Other | Admitting: Family Medicine

## 2017-06-04 ENCOUNTER — Telehealth (INDEPENDENT_AMBULATORY_CARE_PROVIDER_SITE_OTHER): Payer: Self-pay | Admitting: Orthopaedic Surgery

## 2017-06-04 ENCOUNTER — Encounter: Payer: Self-pay | Admitting: Family Medicine

## 2017-06-04 VITALS — BP 110/66 | HR 81 | Temp 98.4°F | Resp 16 | Ht 65.0 in | Wt 175.2 lb

## 2017-06-04 DIAGNOSIS — R232 Flushing: Secondary | ICD-10-CM

## 2017-06-04 DIAGNOSIS — M79602 Pain in left arm: Secondary | ICD-10-CM

## 2017-06-04 MED ORDER — VENLAFAXINE HCL ER 37.5 MG PO CP24: 37.5000 mg | ORAL_CAPSULE | Freq: Every day | ORAL | 2 refills | Status: DC

## 2017-06-04 MED ORDER — CYCLOBENZAPRINE HCL 5 MG PO TABS: 5.0000 mg | ORAL_TABLET | Freq: Three times a day (TID) | ORAL | 1 refills | Status: DC | PRN

## 2017-06-04 NOTE — Telephone Encounter (Signed)
Message sent in error
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
## 2017-06-04 NOTE — Patient Instructions (Signed)
We will refer you to sport med for evaluation If any symptoms change/ worsen - -let us know

## 2017-06-11 ENCOUNTER — Telehealth: Payer: Self-pay | Admitting: Family Medicine

## 2017-06-11 ENCOUNTER — Encounter: Payer: Self-pay | Admitting: Family Medicine

## 2017-06-11 ENCOUNTER — Ambulatory Visit (INDEPENDENT_AMBULATORY_CARE_PROVIDER_SITE_OTHER): Payer: Medicare Other | Admitting: Family Medicine

## 2017-06-11 VITALS — BP 115/77 | HR 74 | Ht 65.0 in | Wt 171.0 lb

## 2017-06-11 DIAGNOSIS — M79602 Pain in left arm: Secondary | ICD-10-CM | POA: Diagnosis not present

## 2017-06-11 DIAGNOSIS — M25512 Pain in left shoulder: Secondary | ICD-10-CM

## 2017-06-11 NOTE — Telephone Encounter (Signed)
Copied from [place] [phone]. Topic: Quick Communication - Rx Refill/Question >> Jun 11, 2017 10:53 AM Oliver Pila B wrote: Medication: venlafaxine XR (EFFEXOR XR) 37.5 MG 24 hr capsule [[id number]]   Pt called to state the medication above she has stopped taking b/c it makes her feel like a zombie, call pt to advise

## 2017-06-11 NOTE — Patient Instructions (Signed)
This is most consistent with overuse strains of your rotator cuff and upper arm muscles. Start theraband strengthening 3 sets of 10 once a day. Tylenol if needed for pain. Try to avoid aleve or ibuprofen products if possible. We will get you in with the cardiologists too to see if you need any further workup given the unusual nature of your pain sometimes coming on with minimal activity, at rest, including pain in your neck and upper back, that one episode where it was in your chest.

## 2017-06-15 ENCOUNTER — Ambulatory Visit (INDEPENDENT_AMBULATORY_CARE_PROVIDER_SITE_OTHER): Payer: Medicare Other | Admitting: Cardiology

## 2017-06-15 VITALS — BP 132/80 | HR 79 | Ht 65.0 in | Wt 177.4 lb

## 2017-06-15 DIAGNOSIS — E785 Hyperlipidemia, unspecified: Secondary | ICD-10-CM

## 2017-06-15 DIAGNOSIS — R0789 Other chest pain: Secondary | ICD-10-CM

## 2017-06-15 DIAGNOSIS — I10 Essential (primary) hypertension: Secondary | ICD-10-CM | POA: Diagnosis not present
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
## 2017-06-15 NOTE — Patient Instructions (Signed)
Medication Instructions:  Your physician recommends that you continue on your current medications as directed. Please refer to the Current Medication list given to you today.   Labwork: None  Testing/Procedures: You had an EKG today.   Your physician has requested that you have a stress echocardiogram. For further information please visit [email or web address]. Please follow instruction sheet as given.  Follow-Up: Your physician recommends that you schedule a follow-up appointment in: 1 month.  If you need a refill on your cardiac medications before your next appointment, please call your pharmacy.   Thank you for choosing [facility]! Panagiotis Oelkers, RN [phone]    

## 2017-06-16 ENCOUNTER — Encounter: Payer: Self-pay | Admitting: Family Medicine

## 2017-06-16 NOTE — Assessment & Plan Note (Signed)
unusual history of intermittent pain sometimes coming on at rest and involving chest, upper back.  Likely due to overuse muscle strains including rotator cuff and upper arm muscles but given unusual description will refer to cardiology for evaluation.  Tylenol if needed.  Start theraband strengthening - exercises shown today.  F/u in 6 weeks.

## 2017-06-16 NOTE — Telephone Encounter (Signed)
agree

## 2017-06-16 NOTE — Telephone Encounter (Signed)
Patient states that she is fine now.  She would like to see a GYN about this instead of trying something else.  Advise that she can call the [facility] across from [place] to make an appointment.  She should not need a referral.
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
## 2017-06-16 NOTE — Telephone Encounter (Signed)
Is she ok now?  Does she want to try something different.

## 2017-06-23 ENCOUNTER — Other Ambulatory Visit: Payer: Self-pay | Admitting: Family Medicine

## 2017-06-23 DIAGNOSIS — Z8619 Personal history of other infectious and parasitic diseases: Secondary | ICD-10-CM

## 2017-07-01 ENCOUNTER — Ambulatory Visit: Payer: Medicare Other | Admitting: Obstetrics and Gynecology

## 2017-07-06 ENCOUNTER — Ambulatory Visit (INDEPENDENT_AMBULATORY_CARE_PROVIDER_SITE_OTHER): Payer: Medicare Other | Admitting: Family Medicine

## 2017-07-06 ENCOUNTER — Encounter: Payer: Self-pay | Admitting: Family Medicine

## 2017-07-06 VITALS — BP 120/72 | HR 83 | Temp 98.7°F | Ht 65.0 in | Wt 179.4 lb

## 2017-07-06 DIAGNOSIS — M940 Chondrocostal junction syndrome [Tietze]: Secondary | ICD-10-CM | POA: Diagnosis not present
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
## 2017-07-06 NOTE — Progress Notes (Signed)
Pre visit review using our clinic review tool, if applicable. No additional management support is needed unless otherwise documented below in the visit note. 

## 2017-07-06 NOTE — Patient Instructions (Addendum)
Ice/cold pack over area for 10-15 min twice daily.  Heat (pad or rice pillow in microwave) over affected area, 10-15 minutes twice daily.   Stretch the area daily. Find a doorway. Hold stretch for 30 seconds and repeat two times.  OK to do your home stretches, be gentle though.  Let us know if you need anything.  OK to take Tylenol 1000 mg (2 extra strength tabs) or 975 mg (3 regular strength tabs) every 6 hours as needed.  Ibuprofen 400-600 mg (2-3 over the counter strength tabs) every 6 hours as needed for pain.  Costochondritis Costochondritis is swelling and irritation (inflammation) of the tissue (cartilage) that connects your ribs to your breastbone (sternum). This causes pain in the front of your chest. The pain usually starts gradually and involves more than one rib. What are the causes? The exact cause of this condition is not always known. It results from stress on the cartilage where your ribs attach to your sternum. The cause of this stress could be:  Chest injury (trauma).  Exercise or activity, such as lifting.  Severe coughing.  What increases the risk? You may be at higher risk for this condition if you:  Are female.  Are 18?64 years old.  Recently started a new exercise or work activity.  Have low levels of vitamin D.  Have a condition that makes you cough frequently.  What are the signs or symptoms? The main symptom of this condition is chest pain. The pain:  Usually starts gradually and can be sharp or dull.  Gets worse with deep breathing, coughing, or exercise.  Gets better with rest.  May be worse when you press on the sternum-rib connection (tenderness).  How is this diagnosed? This condition is diagnosed based on your symptoms, medical history, and a physical exam. Your health care provider will check for tenderness when pressing on your sternum. This is the most important finding. You may also have tests to rule out other causes of chest  pain. These may include:  A chest X-ray to check for lung problems.  An electrocardiogram (ECG) to see if you have a heart problem that could be causing the pain.  An imaging scan to rule out a chest or rib fracture.  How is this treated? This condition usually goes away on its own over time. Your health care provider may prescribe an NSAID to reduce pain and inflammation. Your health care provider may also suggest that you:  Rest and avoid activities that make pain worse.  Apply heat or cold to the area to reduce pain and inflammation.  Do exercises to stretch your chest muscles.  If these treatments do not help, your health care provider may inject a numbing medicine at the sternum-rib connection to help relieve the pain. Follow these instructions at home:  Avoid activities that make pain worse. This includes any activities that use chest, abdominal, and side muscles.  If directed, put ice on the painful area: ? Put ice in a plastic bag. ? Place a towel between your skin and the bag. ? Leave the ice on for 20 minutes, 2-3 times a day.  If directed, apply heat to the affected area as often as told by your health care provider. Use the heat source that your health care provider recommends, such as a moist heat pack or a heating pad. ? Place a towel between your skin and the heat source. ? Leave the heat on for 20-30 minutes. ? Remove the heat  if your skin turns bright red. This is especially important if you are unable to feel pain, heat, or cold. You may have a greater risk of getting burned.  Take over-the-counter and prescription medicines only as told by your health care provider.  Return to your normal activities as told by your health care provider. Ask your health care provider what activities are safe for you.  Keep all follow-up visits as told by your health care provider. This is important. Contact a health care provider if:  You have chills or a fever.  Your pain  does not go away or it gets worse.  You have a cough that does not go away (is persistent). Get help right away if:  You have shortness of breath. This information is not intended to replace advice given to you by your health care provider. Make sure you discuss any questions you have with your health care provider. Document Released: 10/09/2004 Document Revised: 07/20/2015 Document Reviewed: 04/25/2015 Elsevier Interactive Patient Education  [employer].

## 2017-07-10 ENCOUNTER — Other Ambulatory Visit (HOSPITAL_BASED_OUTPATIENT_CLINIC_OR_DEPARTMENT_OTHER): Payer: Medicare Other

## 2017-07-20 ENCOUNTER — Ambulatory Visit (HOSPITAL_BASED_OUTPATIENT_CLINIC_OR_DEPARTMENT_OTHER)
Admission: RE | Admit: 2017-07-20 | Discharge: 2017-07-20 | Disposition: A | Discharge status: Home / Self Care | Payer: Medicare Other | Source: Ambulatory Visit | Attending: Cardiology | Admitting: Cardiology

## 2017-07-20 ENCOUNTER — Ambulatory Visit (INDEPENDENT_AMBULATORY_CARE_PROVIDER_SITE_OTHER): Payer: Medicare Other | Admitting: Cardiology

## 2017-07-20 ENCOUNTER — Encounter: Payer: Self-pay | Admitting: Cardiology

## 2017-07-20 VITALS — BP 110/70 | HR 92 | Ht 65.0 in | Wt 177.1 lb

## 2017-07-20 DIAGNOSIS — I10 Essential (primary) hypertension: Secondary | ICD-10-CM | POA: Diagnosis not present

## 2017-07-20 DIAGNOSIS — R0789 Other chest pain: Secondary | ICD-10-CM

## 2017-07-20 DIAGNOSIS — E785 Hyperlipidemia, unspecified: Secondary | ICD-10-CM | POA: Diagnosis not present

## 2017-07-20 DIAGNOSIS — K219 Gastro-esophageal reflux disease without esophagitis: Secondary | ICD-10-CM

## 2017-07-20 NOTE — Patient Instructions (Signed)
Medication Instructions:  Your physician recommends that you continue on your current medications as directed. Please refer to the Current Medication list given to you today.   Labwork: None  Testing/Procedures: None  Follow-Up: Your physician wants you to follow-up in: 6 months. You will receive a reminder letter in the mail two months in advance. If you don't receive a letter, please call our office to schedule the follow-up appointment.   If you need a refill on your cardiac medications before your next appointment, please call your pharmacy.   Thank you for choosing [facility]! Catherine Lockhart, RN [phone]    

## 2017-07-20 NOTE — Progress Notes (Signed)
  Echocardiogram Echocardiogram Stress Test has been performed.  Darlina Sicilian M 07/20/2017, 2:47 PM
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
## 2017-07-23 ENCOUNTER — Ambulatory Visit: Payer: Medicare Other | Admitting: Family Medicine

## 2017-07-23 ENCOUNTER — Encounter: Payer: Self-pay | Admitting: Family Medicine

## 2017-07-23 VITALS — BP 122/82 | HR 76 | Temp 98.4°F | Ht 65.0 in | Wt 178.2 lb

## 2017-07-23 DIAGNOSIS — B9789 Other viral agents as the cause of diseases classified elsewhere: Secondary | ICD-10-CM | POA: Diagnosis not present

## 2017-07-23 DIAGNOSIS — J069 Acute upper respiratory infection, unspecified: Secondary | ICD-10-CM | POA: Diagnosis not present

## 2017-07-23 MED ORDER — BENZONATATE 100 MG PO CAPS: 100.0000 mg | ORAL_CAPSULE | Freq: Three times a day (TID) | ORAL | 0 refills | Status: DC | PRN
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
## 2017-07-23 NOTE — Patient Instructions (Signed)
Artificial tears like Refresh and Systane may be used for comfort. OK to get generic version. Generally people use them every 2-4 hours, but you can use them as much as you want because there is no medication in it.  Continue to push fluids, practice good hand hygiene, and cover your mouth if you cough.  If you start having fevers, shaking or shortness of breath, seek immediate care.  Let us know if you need anything.

## 2017-07-29 ENCOUNTER — Encounter: Payer: Self-pay | Admitting: Obstetrics & Gynecology

## 2017-07-29 ENCOUNTER — Ambulatory Visit (INDEPENDENT_AMBULATORY_CARE_PROVIDER_SITE_OTHER): Payer: Medicare Other | Admitting: Obstetrics & Gynecology

## 2017-07-29 ENCOUNTER — Other Ambulatory Visit (HOSPITAL_COMMUNITY)
Admission: RE | Admit: 2017-07-29 | Discharge: 2017-07-29 | Disposition: A | Discharge status: Home / Self Care | Payer: Medicare Other | Source: Ambulatory Visit | Attending: Obstetrics and Gynecology | Admitting: Obstetrics and Gynecology

## 2017-07-29 VITALS — BP 108/71 | HR 84 | Ht 65.0 in | Wt 178.0 lb

## 2017-07-29 DIAGNOSIS — R6882 Decreased libido: Secondary | ICD-10-CM | POA: Insufficient documentation

## 2017-07-29 DIAGNOSIS — Z01419 Encounter for gynecological examination (general) (routine) without abnormal findings: Secondary | ICD-10-CM | POA: Insufficient documentation

## 2017-07-29 DIAGNOSIS — Z1151 Encounter for screening for human papillomavirus (HPV): Secondary | ICD-10-CM | POA: Insufficient documentation

## 2017-07-29 DIAGNOSIS — Z1239 Encounter for other screening for malignant neoplasm of breast: Secondary | ICD-10-CM

## 2017-07-29 NOTE — Progress Notes (Signed)
Subjective:     Kimberly Escobar is a 62 y.o. female here for a routine exam. G4P4 LMP in her 4's  Current complaints: decreased libido.  Pt was prescribed antidepressants prev to assist but, it did not work.   Pt reports that her sx have existed for 'a good while' >5 years.  Married. She reports that once her husband 'announces that they are going to have sex she gets mad' Pt reports some dryness with intercourse. Pt uses KY or Vaseline.  Pt denies leakage of urine. Pt reports that with stimulation she is aroused. Pt is concerned about dense lumpy breasts.  .      Gynecologic History No LMP recorded. Patient is postmenopausal. Contraception: post menopausal status Last Pap: 05/29/2014. Results were: hrHPV not done Last mammogram: 08/18/2016. Results were: normal  Obstetric History G4P4  The following portions of the patient's history were reviewed and updated as appropriate: allergies, current medications, past family history, past medical history, past social history, past surgical history and problem list.  Review of Systems Pertinent items are noted in HPI.    Objective:  BP 108/71   Pulse 84   Ht 5\' 5"  (1.651 m)   Wt 178 lb (80.7 kg)   BMI 29.62 kg/m  General Appearance:    Alert, cooperative, no distress, appears stated age  Head:    Normocephalic, without obvious abnormality, atraumatic  Eyes:    conjunctiva/corneas clear, EOM's intact, both eyes  Ears:    Normal external ear canals, both ears  Nose:   Nares normal, septum midline, mucosa normal, no drainage    or sinus tenderness  Throat:   Lips, mucosa, and tongue normal; teeth and gums normal  Neck:   Supple, symmetrical, trachea midline, no adenopathy;    thyroid:  no enlargement/tenderness/nodules  Back:     Symmetric, no curvature, ROM normal, no CVA tenderness  Lungs:     Clear to auscultation bilaterally, respirations unlabored  Chest Wall:    No tenderness or deformity   Heart:    Regular rate and rhythm, S1 and S2  normal, no murmur, rub   or gallop  Breast Exam:    No tenderness, masses, or nipple abnormality  Abdomen:     Soft, non-tender, bowel sounds active all four quadrants,    no masses, no organomegaly  Genitalia:    Normal female without lesion, discharge or tenderness; no obvious atrophy noted.      Extremities:   Extremities normal, atraumatic, no cyanosis or edema  Pulses:   2+ and symmetric all extremities  Skin:   Skin color, texture, turgor normal, no rashes or lesions      Assessment:    Healthy female exam.   Decreased libido    Plan:  Coconut oil 100%- twice a day for a moisturizer Couples massage class and date night Vaginal stimulant cream or gel (eg KY intense) make sure it is unscented to avoid allergic reaction  Mammogram in Aug 2019 F/u PAP with hrHPV F/u in 6 weeks or sooner prn  Mechille Varghese L. Harraway-Smith, M.D., Cherlynn June

## 2017-07-29 NOTE — Patient Instructions (Addendum)
Instructions:  Coconut oil 100%- twice a day for a moisturizer  Date night!!!  Once a week or once every 2 weeks  Couples massage class  Vaginal stimulant cream or gel (eg KY intense) make sure it is unscented to avoid allergic reaction

## 2017-07-31 LAB — CYTOLOGY - PAP
DIAGNOSIS: NEGATIVE
HPV: NOT DETECTED

## 2017-08-03 ENCOUNTER — Ambulatory Visit: Payer: Medicare Other | Admitting: *Deleted
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
## 2017-08-13 ENCOUNTER — Ambulatory Visit: Payer: Medicare Other | Admitting: *Deleted

## 2017-08-24 ENCOUNTER — Telehealth: Payer: Self-pay | Admitting: Family Medicine

## 2017-08-24 NOTE — Telephone Encounter (Signed)
Copied from [place] [id number]. Topic: Quick Communication - See Telephone Encounter >> Aug 24, 2017  4:35 PM Ivar Drape wrote: CRM for notification. See Telephone encounter for: 08/24/17. Patient stated that it costs her $95 for her acyclovir ointment (ZOVIRAX) 5 %.  So she called [facility] to get a Tier exemption, but [facility] stated Dr. Rogelia Mire will have to talk to them about this prescription.  Please call them at [phone]. 7am-12pm.  [facility] said this needs to be settled within 14 days.  They are waiting for the providers phone call.

## 2017-08-25 NOTE — Telephone Encounter (Signed)
If she wants topical ---  famvir is a pill

## 2017-08-25 NOTE — Telephone Encounter (Signed)
Tier request received- however Pt has not tried and failed famciclovir- preferred medication. Tier request shredded.

## 2017-08-26 NOTE — Telephone Encounter (Signed)
Spoke with insurance.  They denied tier exemption because of no diagnosis code and patient has not tried any other medications.  Advise ins that patient takes pills as well as ointment.  Will send in appeal letter today.

## 2017-08-28 ENCOUNTER — Encounter: Payer: Self-pay | Admitting: Family Medicine

## 2017-08-28 ENCOUNTER — Ambulatory Visit (INDEPENDENT_AMBULATORY_CARE_PROVIDER_SITE_OTHER): Payer: Medicare Other | Admitting: Family Medicine

## 2017-08-28 VITALS — BP 110/57 | HR 67 | Temp 98.4°F | Resp 16 | Ht 64.8 in | Wt 177.4 lb

## 2017-08-28 DIAGNOSIS — I10 Essential (primary) hypertension: Secondary | ICD-10-CM

## 2017-08-28 DIAGNOSIS — M79602 Pain in left arm: Secondary | ICD-10-CM | POA: Diagnosis not present

## 2017-08-28 DIAGNOSIS — Z0000 Encounter for general adult medical examination without abnormal findings: Secondary | ICD-10-CM | POA: Diagnosis not present

## 2017-08-28 DIAGNOSIS — E785 Hyperlipidemia, unspecified: Secondary | ICD-10-CM | POA: Diagnosis not present

## 2017-08-28 DIAGNOSIS — Z1159 Encounter for screening for other viral diseases: Secondary | ICD-10-CM | POA: Diagnosis not present

## 2017-08-28 DIAGNOSIS — M545 Low back pain: Secondary | ICD-10-CM

## 2017-08-28 LAB — CBC WITH DIFFERENTIAL/PLATELET
BASOS ABS: 0 10*3/uL (ref 0.0–0.1)
BASOS PCT: 0.4 % (ref 0.0–3.0)
Eosinophils Absolute: 0.1 10*3/uL (ref 0.0–0.7)
Eosinophils Relative: 1 % (ref 0.0–5.0)
HEMATOCRIT: 39.4 % (ref 36.0–46.0)
Hemoglobin: 13.3 g/dL (ref 12.0–15.0)
LYMPHS PCT: 41.7 % (ref 12.0–46.0)
Lymphs Abs: 2.3 10*3/uL (ref 0.7–4.0)
MCHC: 33.7 g/dL (ref 30.0–36.0)
MCV: 96 fl (ref 78.0–100.0)
MONO ABS: 0.4 10*3/uL (ref 0.1–1.0)
Monocytes Relative: 8 % (ref 3.0–12.0)
NEUTROS ABS: 2.7 10*3/uL (ref 1.4–7.7)
Neutrophils Relative %: 48.9 % (ref 43.0–77.0)
PLATELETS: 276 10*3/uL (ref 150.0–400.0)
RBC: 4.1 Mil/uL (ref 3.87–5.11)
RDW: 13.3 % (ref 11.5–15.5)
WBC: 5.6 10*3/uL (ref 4.0–10.5)

## 2017-08-28 LAB — COMPREHENSIVE METABOLIC PANEL
ALBUMIN: 4.4 g/dL (ref 3.5–5.2)
ALK PHOS: 59 U/L (ref 39–117)
ALT: 31 U/L (ref 0–35)
AST: 19 U/L (ref 0–37)
BILIRUBIN TOTAL: 0.5 mg/dL (ref 0.2–1.2)
BUN: 16 mg/dL (ref 6–23)
CALCIUM: 10.1 mg/dL (ref 8.4–10.5)
CO2: 30 mEq/L (ref 19–32)
CREATININE: 0.76 mg/dL (ref 0.40–1.20)
Chloride: 107 mEq/L (ref 96–112)
GFR: 98.55 mL/min (ref >60.00)
Glucose, Bld: 87 mg/dL (ref 70–99)
Potassium: 4.7 mEq/L (ref 3.5–5.1)
Sodium: 142 mEq/L (ref 135–145)
TOTAL PROTEIN: 7.5 g/dL (ref 6.0–8.3)

## 2017-08-28 LAB — LIPID PANEL
CHOLESTEROL: 192 mg/dL (ref 0–200)
HDL: 55.9 mg/dL (ref >39.00)
LDL Cholesterol: 113 mg/dL — ABNORMAL HIGH (ref 0–99)
NonHDL: 136.3
TRIGLYCERIDES: 115 mg/dL (ref 0.0–149.0)
Total CHOL/HDL Ratio: 3
VLDL: 23 mg/dL (ref 0.0–40.0)

## 2017-08-28 LAB — TSH: TSH: 0.79 u[IU]/mL (ref 0.35–4.50)

## 2017-08-28 MED ORDER — CYCLOBENZAPRINE HCL 5 MG PO TABS: 5.0000 mg | ORAL_TABLET | Freq: Three times a day (TID) | ORAL | 1 refills | Status: DC | PRN

## 2017-08-28 MED ORDER — TRAMADOL HCL 50 MG PO TABS: 50.0000 mg | ORAL_TABLET | Freq: Four times a day (QID) | ORAL | 0 refills | Status: DC | PRN

## 2017-08-28 NOTE — Progress Notes (Signed)
Subjective:     Kimberly Escobar is a 62 y.o. female and is here for a comprehensive physical exam. The patient reports no problems. She needs med refills  Social History   Socioeconomic History  . Marital status: Married    Spouse name: Not on file  . Number of children: 4  . Years of education: Not on file  . Highest education level: Not on file  Occupational History  . Occupation: housewife    Employer: OTHER  Social Needs  . Financial resource strain: Not on file  . Food insecurity:    Worry: Not on file    Inability: Not on file  . Transportation needs:    Medical: Not on file    Non-medical: Not on file  Tobacco Use  . Smoking status: Never Smoker  . Smokeless tobacco: Never Used  Substance and Sexual Activity  . Alcohol use: Not Currently  . Drug use: No  . Sexual activity: Yes    Partners: Male    Birth control/protection: None  Lifestyle  . Physical activity:    Days per week: Not on file    Minutes per session: Not on file  . Stress: Not on file  Relationships  . Social connections:    Talks on phone: Not on file    Gets together: Not on file    Attends religious service: Not on file    Active member of club or organization: Not on file    Attends meetings of clubs or organizations: Not on file    Relationship status: Not on file  . Intimate partner violence:    Fear of current or ex partner: Not on file    Emotionally abused: Not on file    Physically abused: Not on file    Forced sexual activity: Not on file  Other Topics Concern  . Not on file  Social History Narrative   Exercise --no secondary to plantar fascitis   Health Maintenance  Topic Date Due  . Hepatitis C Screening  1955-10-28  . MAMMOGRAM  08/18/2017  . INFLUENZA VACCINE  11/03/2017 (Originally 08/13/2017)  . HIV Screening  08/28/2028 (Originally [birth year]14/1968)  . PAP SMEAR  07/29/2020  . TETANUS/TDAP  11/16/2021  . COLONOSCOPY  06/29/2022    The following portions of the patient's  history were reviewed and updated as appropriate:  She  has a past medical history of Acute costochondritis, Arthritis, Back pain, Carpal tunnel syndrome of right wrist, Environmental allergies, Migraines, Plantar fasciitis, and Wears glasses. She does not have any pertinent problems on file. She  has a past surgical history that includes Cesarean section; Colonoscopy; Upper gastrointestinal endoscopy; Tubal ligation; and Carpal tunnel release (Right, 02/02/2014). Her family history includes Arthritis in her unknown relative; Breast cancer (age of onset: 19) in her sister; Heart disease in her mother and sister; Hypertension in her mother and sister; Stroke (age of onset: 75) in her sister. She  reports that she has never smoked. She has never used smokeless tobacco. She reports that she drank alcohol. She reports that she does not use drugs. She has a current medication list which includes the following prescription(s): acyclovir, acyclovir ointment, aspirin, benzonatate, cyclobenzaprine, cyclobenzaprine, evening primrose oil, ibuprofen, multivitamin-lutein, nutritional supplements, pyridoxine hcl, ranitidine, sumatriptan, and triamcinolone. Current Outpatient Medications on File Prior to Visit  Medication Sig Dispense Refill  . acyclovir (ZOVIRAX) 400 MG tablet Take 1 tablet (400 mg total) by mouth 2 (two) times daily. (Patient taking differently: Take 400 mg  by mouth as needed. ) 60 tablet 11  . acyclovir ointment (ZOVIRAX) 5 % APPLY TOPICALLY EVERY 3 HOURS AS DIRECTED 15 g 0  . aspirin 81 MG tablet Take 81 mg by mouth daily.    . benzonatate (TESSALON) 100 MG capsule Take 1 capsule (100 mg total) by mouth 3 (three) times daily as needed. 30 capsule 0  . cyclobenzaprine (FLEXERIL) 10 MG tablet Take 1 tablet (10 mg total) by mouth 3 (three) times daily as needed for muscle spasms. 30 tablet 0  . cyclobenzaprine (FLEXERIL) 5 MG tablet Take 1 tablet (5 mg total) by mouth 3 (three) times daily as needed  for muscle spasms. 30 tablet 1  . EVENING PRIMROSE OIL PO Take 1 capsule by mouth 2 (two) times daily before a meal. 1300 mg twice a day for hot flashes    . ibuprofen (ADVIL,MOTRIN) 800 MG tablet     . multivitamin-lutein (OCUVITE-LUTEIN) CAPS Take 1 capsule by mouth daily.    . Nutritional Supplements (GRAPESEED EXTRACT PO) Take 1 tablet by mouth daily.    . Pyridoxine HCl (VITAMIN B6 PO) Take 2 tablets by mouth daily. Reported on 06/14/2015    . ranitidine (ZANTAC) 150 MG tablet Take 1 tablet (150 mg total) by mouth 2 (two) times daily. 60 tablet 5  . SUMAtriptan (IMITREX) 100 MG tablet Take 1 tablet (100 mg total) by mouth every 2 (two) hours as needed for migraine. May repeat in 2 hours if headache persists or recurs. 10 tablet 0  . triamcinolone (NASACORT) 55 MCG/ACT AERO nasal inhaler Place 2 sprays into the nose daily. (Patient taking differently: Place 2 sprays into the nose daily as needed. ) 1 Inhaler 12   No current facility-administered medications on file prior to visit.    She is allergic to codeine..  Review of Systems Review of Systems  Constitutional: Negative for activity change, appetite change and fatigue.  HENT: Negative for hearing loss, congestion, tinnitus and ear discharge.  dentist q64m Eyes: Negative for visual disturbance (see optho q1y -- vision corrected to 20/20 with glasses).  Respiratory: Negative for cough, chest tightness and shortness of breath.   Cardiovascular: Negative for chest pain, palpitations and leg swelling.  Gastrointestinal: Negative for abdominal pain, diarrhea, constipation and abdominal distention.  Genitourinary: Negative for urgency, frequency, decreased urine volume and difficulty urinating.  Musculoskeletal: Negative for back pain, arthralgias and gait problem.  Skin: Negative for color change, pallor and rash.  Neurological: Negative for dizziness, light-headedness, numbness and headaches.  Hematological: Negative for adenopathy. Does  not bruise/bleed easily.  Psychiatric/Behavioral: Negative for suicidal ideas, confusion, sleep disturbance, self-injury, dysphoric mood, decreased concentration and agitation.      Objective:    BP (!) 110/57 (BP Location: Right Arm, Cuff Size: Large)   Pulse 67   Temp 98.4 F (36.9 C) (Oral)   Resp 16   Ht 5' 4.8" (1.646 m)   Wt 177 lb 6.4 oz (80.5 kg)   SpO2 100%   BMI 29.70 kg/m  General appearance: alert, cooperative, appears stated age and no distress Head: Normocephalic, without obvious abnormality, atraumatic Eyes: negative findings: lids and lashes normal, conjunctivae and sclerae normal and pupils equal, round, reactive to light and accomodation Ears: normal TM's and external ear canals both ears Nose: Nares normal. Septum midline. Mucosa normal. No drainage or sinus tenderness. Throat: lips, mucosa, and tongue normal; teeth and gums normal Neck: no adenopathy, no carotid bruit, no JVD, supple, symmetrical, trachea midline and thyroid not enlarged,  symmetric, no tenderness/mass/nodules Back: symmetric, no curvature. ROM normal. No CVA tenderness. Lungs: clear to auscultation bilaterally Breasts: gyn Heart: regular rate and rhythm, S1, S2 normal, no murmur, click, rub or gallop Abdomen: soft, non-tender; bowel sounds normal; no masses,  no organomegaly Pelvic: deferred--gyn Extremities: extremities normal, atraumatic, no cyanosis or edema Pulses: 2+ and symmetric Skin: Skin color, texture, turgor normal. No rashes or lesions Lymph nodes: Cervical, supraclavicular, and axillary nodes normal. Neurologic: Alert and oriented X 3, normal strength and tone. Normal symmetric reflexes. Normal coordination and gait    Assessment:    Healthy female exam.      Plan:    ghm utd Check labs  See After Visit Summary for Counseling Recommendations    1. Encounter for hepatitis C screening test for low risk patient  - Hepatitis C antibody  2. Preventative health care See  above  - CBC with Differential/Platelet - Lipid panel - Comprehensive metabolic panel - Hepatitis C antibody - TSH  3. Essential hypertension Well controlled, no changes to meds. Encouraged heart healthy diet such as the DASH diet and exercise as tolerated.  - CBC with Differential/Platelet - Lipid panel - Comprehensive metabolic panel  4. Hyperlipidemia LDL goal <100 Encouraged heart healthy diet, increase exercise, avoid trans fats, consider a krill oil cap daily - CBC with Differential/Platelet - Lipid panel - Comprehensive metabolic panel  5. Left arm pain Stable  - cyclobenzaprine (FLEXERIL) 5 MG tablet; Take 1 tablet (5 mg total) by mouth 3 (three) times daily as needed for muscle spasms.  Dispense: 30 tablet; Refill: 1  6. Low back pain, unspecified back pain laterality, unspecified chronicity, with sciatica presence unspecified Stable---- only takes tramadol occassionally - traMADol (ULTRAM) 50 MG tablet; Take 1 tablet (50 mg total) by mouth every 6 (six) hours as needed.  Dispense: 30 tablet; Refill: 0

## 2017-08-28 NOTE — Patient Instructions (Signed)
Preventive Care 40-64 Years, Female Preventive care refers to lifestyle choices and visits with your health care provider that can promote health and wellness. What does preventive care include?  A yearly physical exam. This is also called an annual well check.  Dental exams once or twice a year.  Routine eye exams. Ask your health care provider how often you should have your eyes checked.  Personal lifestyle choices, including: ? Daily care of your teeth and gums. ? Regular physical activity. ? Eating a healthy diet. ? Avoiding tobacco and drug use. ? Limiting alcohol use. ? Practicing safe sex. ? Taking low-dose aspirin daily starting at age 58. ? Taking vitamin and mineral supplements as recommended by your health care provider. What happens during an annual well check? The services and screenings done by your health care provider during your annual well check will depend on your age, overall health, lifestyle risk factors, and family history of disease. Counseling Your health care provider may ask you questions about your:  Alcohol use.  Tobacco use.  Drug use.  Emotional well-being.  Home and relationship well-being.  Sexual activity.  Eating habits.  Work and work Statistician.  Method of birth control.  Menstrual cycle.  Pregnancy history.  Screening You may have the following tests or measurements:  Height, weight, and BMI.  Blood pressure.  Lipid and cholesterol levels. These may be checked every 5 years, or more frequently if you are over 81 years old.  Skin check.  Lung cancer screening. You may have this screening every year starting at age 78 if you have a 30-pack-year history of smoking and currently smoke or have quit within the past 15 years.  Fecal occult blood test (FOBT) of the stool. You may have this test every year starting at age 65.  Flexible sigmoidoscopy or colonoscopy. You may have a sigmoidoscopy every 5 years or a colonoscopy  every 10 years starting at age 30.  Hepatitis C blood test.  Hepatitis B blood test.  Sexually transmitted disease (STD) testing.  Diabetes screening. This is done by checking your blood sugar (glucose) after you have not eaten for a while (fasting). You may have this done every 1-3 years.  Mammogram. This may be done every 1-2 years. Talk to your health care provider about when you should start having regular mammograms. This may depend on whether you have a family history of breast cancer.  BRCA-related cancer screening. This may be done if you have a family history of breast, ovarian, tubal, or peritoneal cancers.  Pelvic exam and Pap test. This may be done every 3 years starting at age 80. Starting at age 36, this may be done every 5 years if you have a Pap test in combination with an HPV test.  Bone density scan. This is done to screen for osteoporosis. You may have this scan if you are at high risk for osteoporosis.  Discuss your test results, treatment options, and if necessary, the need for more tests with your health care provider. Vaccines Your health care provider may recommend certain vaccines, such as:  Influenza vaccine. This is recommended every year.  Tetanus, diphtheria, and acellular pertussis (Tdap, Td) vaccine. You may need a Td booster every 10 years.  Varicella vaccine. You may need this if you have not been vaccinated.  Zoster vaccine. You may need this after age 5.  Measles, mumps, and rubella (MMR) vaccine. You may need at least one dose of MMR if you were born in  1957 or later. You may also need a second dose.  Pneumococcal 13-valent conjugate (PCV13) vaccine. You may need this if you have certain conditions and were not previously vaccinated.  Pneumococcal polysaccharide (PPSV23) vaccine. You may need one or two doses if you smoke cigarettes or if you have certain conditions.  Meningococcal vaccine. You may need this if you have certain  conditions.  Hepatitis A vaccine. You may need this if you have certain conditions or if you travel or work in places where you may be exposed to hepatitis A.  Hepatitis B vaccine. You may need this if you have certain conditions or if you travel or work in places where you may be exposed to hepatitis B.  Haemophilus influenzae type b (Hib) vaccine. You may need this if you have certain conditions.  Talk to your health care provider about which screenings and vaccines you need and how often you need them. This information is not intended to replace advice given to you by your health care provider. Make sure you discuss any questions you have with your health care provider. Document Released: 01/26/2015 Document Revised: 09/19/2015 Document Reviewed: 10/31/2014 Elsevier Interactive Patient Education  2018 Elsevier Inc.  

## 2017-08-29 LAB — HEPATITIS C ANTIBODY
HEP C AB: NONREACTIVE
SIGNAL TO CUT-OFF: 0.03 (ref <1.00)

## 2017-08-31 ENCOUNTER — Encounter: Payer: Self-pay | Admitting: *Deleted

## 2017-09-09 ENCOUNTER — Encounter (HOSPITAL_BASED_OUTPATIENT_CLINIC_OR_DEPARTMENT_OTHER): Payer: Self-pay

## 2017-09-09 ENCOUNTER — Ambulatory Visit: Payer: Medicare Other | Admitting: Obstetrics & Gynecology

## 2017-09-09 ENCOUNTER — Ambulatory Visit (HOSPITAL_BASED_OUTPATIENT_CLINIC_OR_DEPARTMENT_OTHER)
Admission: RE | Admit: 2017-09-09 | Discharge: 2017-09-09 | Disposition: A | Discharge status: Home / Self Care | Payer: Medicare Other | Source: Ambulatory Visit | Attending: Obstetrics & Gynecology | Admitting: Obstetrics & Gynecology

## 2017-09-09 DIAGNOSIS — Z1231 Encounter for screening mammogram for malignant neoplasm of breast: Secondary | ICD-10-CM | POA: Insufficient documentation

## 2017-09-09 DIAGNOSIS — Z1239 Encounter for other screening for malignant neoplasm of breast: Secondary | ICD-10-CM

## 2017-09-10 ENCOUNTER — Telehealth: Payer: Self-pay | Admitting: Family Medicine

## 2017-09-10 DIAGNOSIS — Z8619 Personal history of other infectious and parasitic diseases: Secondary | ICD-10-CM

## 2017-09-10 NOTE — Telephone Encounter (Signed)
Copied from [place] [id number]. Topic: Quick Communication - Rx Refill/Question >> Sep 10, 2017 12:36 PM Judyann Munson wrote: Medication: acyclovir ointment (ZOVIRAX) 5 %  Has the patient contacted their pharmacy? yes  Preferred Pharmacy (with phone number or street name): [id number] - [place], [place] - [address] AT [place] [phone] (Phone) [phone] (Fax)  Agent: Please be advised that RX refills may take up to 3 business days. We ask that you follow-up with your pharmacy.

## 2017-09-11 MED ORDER — ACYCLOVIR 5 % EX OINT: TOPICAL_OINTMENT | CUTANEOUS | 5 refills | Status: DC

## 2017-09-11 NOTE — Telephone Encounter (Signed)
Left message on machine that rx was sent in.  

## 2017-11-05 ENCOUNTER — Other Ambulatory Visit: Payer: Self-pay | Admitting: Family Medicine

## 2017-11-05 DIAGNOSIS — Z8619 Personal history of other infectious and parasitic diseases: Secondary | ICD-10-CM

## 2017-11-09 NOTE — Telephone Encounter (Signed)
Pt states she did not received any refills on this med, and wants to know does she need to make an appt for any refills, because she takes this everyday.  acyclovir (ZOVIRAX) 400 MG tablet Just wants to know.

## 2017-11-16 ENCOUNTER — Ambulatory Visit: Payer: Medicare Other | Admitting: Family Medicine

## 2017-11-16 ENCOUNTER — Encounter: Payer: Self-pay | Admitting: Family Medicine

## 2017-11-16 VITALS — BP 107/63 | HR 77 | Temp 98.6°F | Resp 16 | Ht 64.8 in | Wt 177.0 lb

## 2017-11-16 DIAGNOSIS — R102 Pelvic and perineal pain: Secondary | ICD-10-CM | POA: Diagnosis not present

## 2017-11-16 DIAGNOSIS — H1013 Acute atopic conjunctivitis, bilateral: Secondary | ICD-10-CM

## 2017-11-16 MED ORDER — AZELASTINE HCL 0.05 % OP SOLN: 1.0000 [drp] | Freq: Two times a day (BID) | OPHTHALMIC | 12 refills | Status: DC

## 2017-11-16 NOTE — Patient Instructions (Signed)
Pelvic Pain, Female Pelvic pain is pain in your lower abdomen, below your belly button and between your hips. The pain may start suddenly (acute), keep coming back (recurring), or last a long time (chronic). Pelvic pain that lasts longer than six months is considered chronic. Pelvic pain may affect your:  Reproductive organs.  Urinary system.  Digestive tract.  Musculoskeletal system.  There are many potential causes of pelvic pain. Sometimes, the pain can be a result of digestive or urinary conditions, strained muscles or ligaments, or even reproductive conditions. Sometimes the cause of pelvic pain is not known. Follow these instructions at home:  Take over-the-counter and prescription medicines only as told by your health care provider.  Rest as told by your health care provider.  Do not have sex it if hurts.  Keep a journal of your pelvic pain. Write down: ? When the pain started. ? Where the pain is located. ? What seems to make the pain better or worse, such as food or your menstrual cycle. ? Any symptoms you have along with the pain.  Keep all follow-up visits as told by your health care provider. This is important. Contact a health care provider if:  Medicine does not help your pain.  Your pain comes back.  You have new symptoms.  You have abnormal vaginal discharge or bleeding, including bleeding after menopause.  You have a fever or chills.  You are constipated.  You have blood in your urine or stool.  You have foul-smelling urine.  You feel weak or lightheaded. Get help right away if:  You have sudden severe pain.  Your pain gets steadily worse.  You have severe pain along with fever, nausea, vomiting, or excessive sweating.  You lose consciousness. This information is not intended to replace advice given to you by your health care provider. Make sure you discuss any questions you have with your health care provider. Document Released: 11/27/2003  Document Revised: 01/24/2015 Document Reviewed: 10/20/2014 Elsevier Interactive Patient Education  2018 Elsevier Inc.  
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
## 2017-11-16 NOTE — Assessment & Plan Note (Signed)
Drops per orders

## 2017-11-16 NOTE — Assessment & Plan Note (Signed)
Resolved rto if it occurs again

## 2017-11-21 ENCOUNTER — Other Ambulatory Visit: Payer: Self-pay | Admitting: Family Medicine

## 2017-11-21 DIAGNOSIS — M545 Low back pain, unspecified: Secondary | ICD-10-CM

## 2017-11-23 NOTE — Telephone Encounter (Signed)
Database ran and is on your desk for review.  Last filled per database: 08/28/17 Last written: 08/28/17 Last ov: 08/28/17 Next ov: none Contract: UDS:  Not sure if you want her to come back in.  She just started med in august.

## 2017-12-02 ENCOUNTER — Encounter: Payer: Self-pay | Admitting: Internal Medicine

## 2017-12-02 ENCOUNTER — Ambulatory Visit: Payer: Self-pay | Admitting: *Deleted

## 2017-12-02 ENCOUNTER — Ambulatory Visit: Payer: Medicare Other | Admitting: Internal Medicine

## 2017-12-02 ENCOUNTER — Ambulatory Visit (HOSPITAL_BASED_OUTPATIENT_CLINIC_OR_DEPARTMENT_OTHER)
Admission: RE | Admit: 2017-12-02 | Discharge: 2017-12-02 | Disposition: A | Discharge status: Home / Self Care | Payer: Medicare Other | Source: Ambulatory Visit | Attending: Internal Medicine | Admitting: Internal Medicine

## 2017-12-02 VITALS — BP 108/76 | HR 86 | Temp 98.0°F | Resp 18 | Ht 64.8 in | Wt 177.0 lb

## 2017-12-02 DIAGNOSIS — R079 Chest pain, unspecified: Secondary | ICD-10-CM

## 2017-12-02 DIAGNOSIS — M79602 Pain in left arm: Secondary | ICD-10-CM

## 2017-12-02 MED ORDER — CYCLOBENZAPRINE HCL 5 MG PO TABS: 5.0000 mg | ORAL_TABLET | Freq: Three times a day (TID) | ORAL | 1 refills | Status: DC | PRN
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
## 2017-12-02 NOTE — Patient Instructions (Signed)
  STOP BY THE FIRST FLOOR:  get the XR   IBUPROFEN (Advil or Motrin) 200 mg 2 tablets every 12 hours as needed for pain.  Always take it with food because may cause gastritis and ulcers.  If you notice nausea, stomach pain, change in the color of stools --->  Stop the medicine and let us know  OR  Tylenol  500 mg OTC 2 tabs a day every 8 hours as needed for pain  OR  tramadol  ER: If severe chest pain, difficulty breathing, heart pounding, leg swelling Call if the pain is not gradually subsiding

## 2017-12-02 NOTE — Telephone Encounter (Signed)
   Reason for Disposition . SEVERE chest pain    Patient states she woke this morning with severe sternal pain radiating to the back. Patient states she took muscle relaxer and it has gotten better. She wants to make an appointment in case the pain gets worse again. Due to protocol- patient has to be advised to get checked at ED- call to office and PCP is not in office this afternoon. Advised patient should go to ED as well. Patient does not want to go- not sure she will go- advised at least UC if she does not go to ED.  Answer Assessment - Initial Assessment Questions 1. LOCATION: "Where does it hurt?"       Center- sternum 2. RADIATION: "Does the pain go anywhere else?" (e.g., into neck, jaw, arms, back)     back 3. ONSET: "When did the chest pain begin?" (Minutes, hours or days)      Woke up with pain- she has back problems 4. PATTERN "Does the pain come and go, or has it been constant since it started?"  "Does it get worse with exertion?"      Subsided with muscle relaxer, it did get worse when moving around 5. DURATION: "How long does it last" (e.g., seconds, minutes, hours)     All day- is better now with medication 6. SEVERITY: "How bad is the pain?"  (e.g., Scale 1-10; mild, moderate, or severe)    - MILD (1-3): doesn't interfere with normal activities     - MODERATE (4-7): interferes with normal activities or awakens from sleep    - SEVERE (8-10): excruciating pain, unable to do any normal activities       This morning-8- now- 2- deep pain 7. CARDIAC RISK FACTORS: "Do you have any history of heart problems or risk factors for heart disease?" (e.g., prior heart attack, angina; high blood pressure, diabetes, being overweight, high cholesterol, smoking, or strong family history of heart disease)     Overweight, family history 8. PULMONARY RISK FACTORS: "Do you have any history of lung disease?"  (e.g., blood clots in lung, asthma, emphysema, birth control pills)     no 9. CAUSE: "What  do you think is causing the chest pain?"     Not sure- this was different- in the trunk of body 10. OTHER SYMPTOMS: "Do you have any other symptoms?" (e.g., dizziness, nausea, vomiting, sweating, fever, difficulty breathing, cough)       no 11. PREGNANCY: "Is there any chance you are pregnant?" "When was your last menstrual period?"       n/a  Protocols used: CHEST PAIN-A-AH

## 2017-12-02 NOTE — Progress Notes (Signed)
Pre visit review using our clinic review tool, if applicable. No additional management support is needed unless otherwise documented below in the visit note. 

## 2017-12-02 NOTE — Telephone Encounter (Signed)
Noted  

## 2017-12-02 NOTE — Telephone Encounter (Signed)
Pt. stated chest pain is not like pain she had in June when she saw Dr. Nani Ravens for costochondritis. Pt. stated chest pain started in sternum this AM, relieved with cyclobenzaprine, but radiating to mid upper back, mild right now. Pt. states she does have lower back pain, but that is chronic from back injury. Pt. denies SOB, fever, N/V. Pt. states she no longer takes her zantac, and when asked about her hypertension, pt. stated "I don't have high blood pressure. I gotta talk to Dr. Etter Sjogren about that". Author suggested pt. be seen today to r/o cardiac etiology, and pt. Agreed. Dr. Etter Sjogren not in the office but appointment made with Dr. Larose Kells at 320PM, pt. Appreciative.

## 2017-12-02 NOTE — Telephone Encounter (Signed)
FYI

## 2017-12-18 DIAGNOSIS — H10023 Other mucopurulent conjunctivitis, bilateral: Secondary | ICD-10-CM | POA: Diagnosis not present

## 2017-12-18 DIAGNOSIS — H04553 Acquired stenosis of bilateral nasolacrimal duct: Secondary | ICD-10-CM | POA: Diagnosis not present

## 2017-12-25 ENCOUNTER — Ambulatory Visit (INDEPENDENT_AMBULATORY_CARE_PROVIDER_SITE_OTHER): Payer: Medicare Other | Admitting: Family

## 2017-12-25 ENCOUNTER — Encounter: Payer: Self-pay | Admitting: Family

## 2017-12-25 VITALS — BP 124/73 | HR 78 | Temp 99.1°F | Resp 16 | Ht 64.8 in | Wt 174.0 lb

## 2017-12-25 DIAGNOSIS — M791 Myalgia, unspecified site: Secondary | ICD-10-CM | POA: Diagnosis not present

## 2017-12-25 DIAGNOSIS — M7918 Myalgia, other site: Secondary | ICD-10-CM | POA: Diagnosis not present

## 2017-12-25 LAB — SEDIMENTATION RATE: Sed Rate: 8 mm/hr (ref 0–30)

## 2017-12-25 MED ORDER — MELOXICAM 7.5 MG PO TABS: 7.5000 mg | ORAL_TABLET | Freq: Every day | ORAL | 0 refills | Status: DC

## 2017-12-25 NOTE — Patient Instructions (Signed)
Please begin meloxicam once daily for muscle pain. (OK to use tylenol or tramadol as needed with this medicine but NOT motrin/ibuprofen). Call if symptoms worsen or if not improved in 2 weeks. Complete lab work prior to leaving.
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
## 2017-12-29 ENCOUNTER — Telehealth: Payer: Self-pay | Admitting: Family

## 2017-12-29 DIAGNOSIS — R768 Other specified abnormal immunological findings in serum: Secondary | ICD-10-CM

## 2017-12-29 LAB — ANTI-NUCLEAR AB-TITER (ANA TITER): ANA Titer 1: 1:40 {titer} — ABNORMAL HIGH

## 2017-12-29 LAB — ANA: Anti Nuclear Antibody(ANA): POSITIVE — AB

## 2017-12-29 LAB — RHEUMATOID FACTOR: Rhuematoid fact SerPl-aCnc: <14 IU/mL (ref <14)

## 2017-12-29 NOTE — Telephone Encounter (Signed)
Please let pt know that her ana, one of the autoimmune tests is elevated.  I would like for her to see rheumatology. Referral has been pended.  Please sign off after you notify patient.

## 2017-12-30 NOTE — Telephone Encounter (Signed)
Patient advised of results and she will like to precede with referral.

## 2018-01-04 ENCOUNTER — Ambulatory Visit: Payer: Medicare Other | Admitting: Family Medicine

## 2018-01-04 ENCOUNTER — Encounter: Payer: Self-pay | Admitting: Family Medicine

## 2018-01-04 VITALS — BP 120/70 | HR 65 | Temp 98.2°F | Resp 16 | Ht 64.8 in | Wt 174.4 lb

## 2018-01-04 DIAGNOSIS — M545 Low back pain, unspecified: Secondary | ICD-10-CM

## 2018-01-04 DIAGNOSIS — M25532 Pain in left wrist: Secondary | ICD-10-CM

## 2018-01-04 MED ORDER — TRAMADOL HCL 50 MG PO TABS: 50.0000 mg | ORAL_TABLET | Freq: Four times a day (QID) | ORAL | 0 refills | Status: DC | PRN

## 2018-01-04 MED ORDER — PREDNISONE 10 MG PO TABS: ORAL_TABLET | ORAL | 0 refills | Status: DC

## 2018-01-04 MED ORDER — MELOXICAM 7.5 MG PO TABS: 7.5000 mg | ORAL_TABLET | Freq: Every day | ORAL | 0 refills | Status: DC
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
## 2018-01-04 NOTE — Patient Instructions (Signed)
Carpal Tunnel Syndrome    Carpal tunnel syndrome is a condition that causes pain in your hand and arm. The carpal tunnel is a narrow area that is on the palm side of your wrist. Repeated wrist motion or certain diseases may cause swelling in the tunnel. This swelling can pinch the main nerve in the wrist (median nerve).  What are the causes?  This condition may be caused by:   Repeated wrist motions.   Wrist injuries.   Arthritis.   A sac of fluid (cyst) or abnormal growth (tumor) in the carpal tunnel.   Fluid buildup during pregnancy.  Sometimes the cause is not known.  What increases the risk?  The following factors may make you more likely to develop this condition:   Having a job in which you move your wrist in the same way many times. This includes jobs like being a butcher or a cashier.   Being a woman.   Having other health conditions, such as:  ? Diabetes.  ? Obesity.  ? A thyroid gland that is not active enough (hypothyroidism).  ? Kidney failure.  What are the signs or symptoms?  Symptoms of this condition include:   A tingling feeling in your fingers.   Tingling or a loss of feeling (numbness) in your hand.   Pain in your entire arm. This pain may get worse when you bend your wrist and elbow for a long time.   Pain in your wrist that goes up your arm to your shoulder.   Pain that goes down into your palm or fingers.   A weak feeling in your hands. You may find it hard to grab and hold items.  You may feel worse at night.  How is this diagnosed?  This condition is diagnosed with a medical history and physical exam. You may also have tests, such as:   Electromyogram (EMG). This test checks the signals that the nerves send to the muscles.   Nerve conduction study. This test checks how well signals pass through your nerves.   Imaging tests, such as X-rays, ultrasound, and MRI. These tests check for what might be the cause of your condition.  How is this treated?  This condition may be treated  with:   Lifestyle changes. You will be asked to stop or change the activity that caused your problem.   Doing exercise and activities that make bones and muscles stronger (physical therapy).   Learning how to use your hand again (occupational therapy).   Medicines for pain and swelling (inflammation). You may have injections in your wrist.   A wrist splint.   Surgery.  Follow these instructions at home:  If you have a splint:   Wear the splint as told by your doctor. Remove it only as told by your doctor.   Loosen the splint if your fingers:  ? Tingle.  ? Lose feeling (become numb).  ? Turn cold and blue.   Keep the splint clean.   If the splint is not waterproof:  ? Do not let it get wet.  ? Cover it with a watertight covering when you take a bath or a shower.  Managing pain, stiffness, and swelling     If told, put ice on the painful area:  ? If you have a removable splint, remove it as told by your doctor.  ? Put ice in a plastic bag.  ? Place a towel between your skin and the bag.  ? Leave the   ice on for 20 minutes, 2-3 times per day.  General instructions   Take over-the-counter and prescription medicines only as told by your doctor.   Rest your wrist from any activity that may cause pain. If needed, talk with your boss at work about changes that can help your wrist heal.   Do any exercises as told by your doctor, physical therapist, or occupational therapist.   Keep all follow-up visits as told by your doctor. This is important.  Contact a doctor if:   You have new symptoms.   Medicine does not help your pain.   Your symptoms get worse.  Get help right away if:   You have very bad numbness or tingling in your wrist or hand.  Summary   Carpal tunnel syndrome is a condition that causes pain in your hand and arm.   It is often caused by repeated wrist motions.   Lifestyle changes and medicines are used to treat this problem. Surgery may help in very bad cases.   Follow your doctor's  instructions about wearing a splint, resting your wrist, keeping follow-up visits, and calling for help.  This information is not intended to replace advice given to you by your health care provider. Make sure you discuss any questions you have with your health care provider.  Document Released: 12/19/2010 Document Revised: 05/08/2017 Document Reviewed: 05/08/2017  [employer] Interactive Patient Education  2019 [employer]

## 2018-01-08 ENCOUNTER — Encounter (HOSPITAL_BASED_OUTPATIENT_CLINIC_OR_DEPARTMENT_OTHER): Payer: Self-pay | Admitting: *Deleted

## 2018-01-08 ENCOUNTER — Ambulatory Visit: Payer: Self-pay | Admitting: *Deleted

## 2018-01-08 ENCOUNTER — Other Ambulatory Visit: Payer: Self-pay | Admitting: Family Medicine

## 2018-01-08 ENCOUNTER — Emergency Department (HOSPITAL_BASED_OUTPATIENT_CLINIC_OR_DEPARTMENT_OTHER)
Admission: EM | Admit: 2018-01-08 | Discharge: 2018-01-08 | Disposition: A | Discharge status: Home / Self Care | Payer: Medicare Other | Attending: Emergency Medicine | Admitting: Emergency Medicine

## 2018-01-08 DIAGNOSIS — Z8619 Personal history of other infectious and parasitic diseases: Secondary | ICD-10-CM

## 2018-01-08 DIAGNOSIS — M79602 Pain in left arm: Secondary | ICD-10-CM

## 2018-01-08 DIAGNOSIS — M79662 Pain in left lower leg: Secondary | ICD-10-CM | POA: Diagnosis not present

## 2018-01-08 MED ORDER — GABAPENTIN 100 MG PO CAPS: 200.0000 mg | ORAL_CAPSULE | Freq: Three times a day (TID) | ORAL | 0 refills | Status: DC
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
## 2018-01-08 NOTE — ED Notes (Signed)
Pt verbalizes understanding of d/c instructions and denies any further needs at this time. 

## 2018-01-08 NOTE — Telephone Encounter (Signed)
Contacted pt regarding symptoms; she says that her pain has gotten worse since her office visit on 01/04/18;  the pain goes from her left pink to under her arm; she says she is to be seen at the [facility] of 01/22/18; the pt says that she finishes her prednisone on 01/11/18; she says that she has some relief but the pain always comes back; she rates her pain at 5 out of 10; the pt would like to have an EKG in case this is heart related; she denies chest pain, jaw discomfort; explained to pt that if she is concerned that she is having a cardiac issue, she should proceed to the ED now, and not wait for an appointment; also explained that the ED is prepared to promptly intervene in a manner that the can not be provided in the office; she states that she will discuss this with her husband when he gets home; the pt would also like to schedule an appointment to be seen in the office due to her left arm pain; she normally sees Dr Etter Sjogren but that provider has no availability; pt offered and accepted an appointment with Debbrah Alar, [place], 01/11/18 at 1120; she verbalized understanding; will route to office for notification.       Reason for Disposition . [1] MODERATE pain (e.g., interferes with normal activities) AND [2] present > 3 days  Answer Assessment - Initial Assessment Questions 1. ONSET: "When did the pain start?"     Seen in office 01/04/18 2. LOCATION: "Where is the pain located?"     Left pinky to under arm 3. PAIN: "How bad is the pain?" (Scale 1-10; or mild, moderate, severe)   - MILD (1-3): doesn't interfere with normal activities   - MODERATE (4-7): interferes with normal activities (e.g., work or school) or awakens from sleep   - SEVERE (8-10): excruciating pain, unable to do any normal activities, unable to hold a cup of water     Rated 5 out of 10  4. WORK OR EXERCISE: "Has there been any recent work or exercise that involved this part of the body?"     no 5. CAUSE: "What do  you think is causing the arm pain?"     Previously diagnosed with carpel tunnel 6. OTHER SYMPTOMS: "Do you have any other symptoms?" (e.g., neck pain, swelling, rash, fever, numbness, weakness)     Radiates into neck when the pt is trying to fall asleep   7. PREGNANCY: "Is there any chance you are pregnant?" "When was your last menstrual period?"     no  Protocols used: ARM PAIN-A-AH

## 2018-01-08 NOTE — ED Notes (Signed)
ED Provider at bedside. 

## 2018-01-08 NOTE — ED Triage Notes (Signed)
Pt is here for arm pain.  Pt has had this pain for 3 weeks and has been evaluated by 3 MD's. Pt states that the pain is dull and aching, it is constantly a 5 and increases to a 8/10. Pt was given pain medication and was tested for RA and given a referral to see a orthopedist.

## 2018-01-09 ENCOUNTER — Other Ambulatory Visit: Payer: Self-pay | Admitting: Family Medicine

## 2018-01-09 DIAGNOSIS — M25532 Pain in left wrist: Secondary | ICD-10-CM

## 2018-01-11 ENCOUNTER — Ambulatory Visit (HOSPITAL_BASED_OUTPATIENT_CLINIC_OR_DEPARTMENT_OTHER)
Admission: RE | Admit: 2018-01-11 | Discharge: 2018-01-11 | Disposition: A | Discharge status: Home / Self Care | Payer: Medicare Other | Source: Ambulatory Visit | Attending: Family | Admitting: Family

## 2018-01-11 ENCOUNTER — Encounter: Payer: Self-pay | Admitting: Family

## 2018-01-11 ENCOUNTER — Telehealth: Payer: Self-pay | Admitting: *Deleted

## 2018-01-11 ENCOUNTER — Ambulatory Visit (INDEPENDENT_AMBULATORY_CARE_PROVIDER_SITE_OTHER): Payer: Medicare Other | Admitting: Family

## 2018-01-11 VITALS — BP 125/73 | HR 83 | Temp 99.1°F | Resp 16 | Ht 64.8 in | Wt 176.0 lb

## 2018-01-11 DIAGNOSIS — R200 Anesthesia of skin: Secondary | ICD-10-CM

## 2018-01-11 DIAGNOSIS — M542 Cervicalgia: Secondary | ICD-10-CM | POA: Diagnosis not present

## 2018-01-11 DIAGNOSIS — M5412 Radiculopathy, cervical region: Secondary | ICD-10-CM

## 2018-01-11 NOTE — Telephone Encounter (Signed)
Patent notified that one was not done today.

## 2018-01-11 NOTE — Telephone Encounter (Signed)
Copied from [place] [phone]. Topic: General - Inquiry >> Jan 11, 2018  3:07 PM Rutherford Nail, NT wrote: Reason for CRM: Patient would like a call back regarding an issue with her AVS from today's visit. States that her AVS says that she received an EKG today and she states that she did not. Please advise.

## 2018-01-11 NOTE — Telephone Encounter (Signed)
Last refill was 01/04/18 for 20 tabs.  Are you ok with writing 90 tabs

## 2018-01-11 NOTE — Patient Instructions (Signed)
Please continue meloxicam and complete prednisone taper. Complete neck x-ray on the first floor.  Contact Dr. Estanislado Pandy to schedule rheumatology appointment.  Dr. Bo Merino Internal medicine - rheumatology [place], [place], [place] [address] [phone]  Call if symptoms worsen or if not improved in 2-3 weeks.
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
## 2018-01-22 DIAGNOSIS — R200 Anesthesia of skin: Secondary | ICD-10-CM | POA: Diagnosis not present

## 2018-01-22 DIAGNOSIS — G5602 Carpal tunnel syndrome, left upper limb: Secondary | ICD-10-CM | POA: Diagnosis not present

## 2018-01-25 ENCOUNTER — Encounter: Payer: Self-pay | Admitting: Neurology
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
## 2018-02-04 ENCOUNTER — Ambulatory Visit (INDEPENDENT_AMBULATORY_CARE_PROVIDER_SITE_OTHER): Payer: Medicare Other | Admitting: Neurology

## 2018-02-04 DIAGNOSIS — G5602 Carpal tunnel syndrome, left upper limb: Secondary | ICD-10-CM

## 2018-02-04 DIAGNOSIS — R200 Anesthesia of skin: Secondary | ICD-10-CM | POA: Diagnosis not present

## 2018-02-04 NOTE — Procedures (Signed)
[facility]  [address], [name]  [place], [place] [address] Tel: [phone] Fax:  [phone] Test Date:  02/04/2018  Patient: Kimberly Escobar DOB: 1955-10-16 Physician: Narda Amber, DO  Sex: Female Height: 5\' 5"  Ref Phys: Daryll Brod, MD  ID#: [id number] Temp: 34.0C Technician:    Patient Complaints: This is a 63 year-old female with history of right CTS release referred for evaluation of bilateral hand paresthesias.   NCV & EMG Findings: Extensive electrodiagnostic testing of the left upper extremity and additional studies of the left shows:  1. Left median sensory response shows prolonged distal peak latency (4.0 ms) with normal amplitude.  Right median and bilateral ulnar sensory responses are within normal limits. 2. Bilateral median and ulnar motor responses are within normal limits. 3. There is no evidence of active or chronic motor axonal loss changes affecting any of the tested muscles.  Motor unit configuration and recruitment pattern is within normal limits.  Impression: 1. Left median neuropathy at or distal to the wrist, consistent with a clinical diagnosis of carpal tunnel syndrome.  Overall, these findings are mild in degree electrically. 2. There is no evidence of a cervical radiculopathy or recurrence of right carpal tunnel syndrome.   ___________________________ Narda Amber, DO    Nerve Conduction Studies Anti Sensory Summary Table   Stim Site NR Peak (ms) Norm Peak (ms) P-T Amp (V) Norm P-T Amp  Left Median Anti Sensory (2nd Digit)  34C  Wrist    4.0 <3.8 15.2 >10  Right Median Anti Sensory (2nd Digit)  34C  Wrist    3.6 <3.8 15.7 >10  Left Ulnar Anti Sensory (5th Digit)  34C  Wrist    2.6 <3.2 26.9 >5  Right Ulnar Anti Sensory (5th Digit)  34C  Wrist    2.4 <3.2 27.5 >5   Motor Summary Table   Stim Site NR Onset (ms) Norm Onset (ms) O-P Amp (mV) Norm O-P Amp Site1 Site2 Delta-0 (ms) Dist (cm) Vel (m/s) Norm Vel (m/s)  Left  Median Motor (Abd Poll Brev)  34C  Wrist    3.7 <4.0 10.3 >5 Elbow Wrist 4.8 30.0 63 >50  Elbow    8.5  10.0         Right Median Motor (Abd Poll Brev)  34C  Wrist    3.2 <4.0 12.4 >5 Elbow Wrist 5.0 32.0 64 >50  Elbow    8.2  11.4         Left Ulnar Motor (Abd Dig Minimi)  34C  Wrist    2.0 <3.1 8.9 >7 B Elbow Wrist 3.7 25.0 68 >50  B Elbow    5.7  8.7  A Elbow B Elbow 1.6 10.0 63 >50  A Elbow    7.3  8.7         Right Ulnar Motor (Abd Dig Minimi)  34C  Wrist    1.9 <3.1 10.5 >7 B Elbow Wrist 3.6 24.0 67 >50  B Elbow    5.5  9.8  A Elbow B Elbow 1.5 10.0 67 >50  A Elbow    7.0  9.6          EMG   Side Muscle Ins Act Fibs Psw Fasc Number Recrt Dur Dur. Amp Amp. Poly Poly. Comment  Left 1stDorInt Nml Nml Nml Nml Nml Nml Nml Nml Nml Nml Nml Nml N/A  Left Abd Poll Brev Nml Nml Nml Nml Nml Nml Nml Nml Nml Nml Nml Nml N/A  Left PronatorTeres Nml Nml Nml Nml Nml Nml Nml Nml Nml Nml Nml Nml N/A  Left Biceps Nml Nml Nml Nml Nml Nml Nml Nml Nml Nml Nml Nml N/A  Left Triceps Nml Nml Nml Nml Nml Nml Nml Nml Nml Nml Nml Nml N/A  Left Deltoid Nml Nml Nml Nml Nml Nml Nml Nml Nml Nml Nml Nml N/A  Right 1stDorInt Nml Nml Nml Nml Nml Nml Nml Nml Nml Nml Nml Nml N/A  Right PronatorTeres Nml Nml Nml Nml Nml Nml Nml Nml Nml Nml Nml Nml N/A  Right Biceps Nml Nml Nml Nml Nml Nml Nml Nml Nml Nml Nml Nml N/A  Right Triceps Nml Nml Nml Nml Nml Nml Nml Nml Nml Nml Nml Nml N/A  Right Deltoid Nml Nml Nml Nml Nml Nml Nml Nml Nml Nml Nml Nml N/A      Waveforms:

## 2018-02-08 ENCOUNTER — Other Ambulatory Visit: Payer: Self-pay | Admitting: Orthopedic Surgery

## 2018-02-08 DIAGNOSIS — G5602 Carpal tunnel syndrome, left upper limb: Secondary | ICD-10-CM | POA: Diagnosis not present

## 2018-02-09 ENCOUNTER — Ambulatory Visit (INDEPENDENT_AMBULATORY_CARE_PROVIDER_SITE_OTHER): Payer: Self-pay

## 2018-02-09 ENCOUNTER — Encounter: Payer: Self-pay | Admitting: Rheumatology

## 2018-02-09 ENCOUNTER — Ambulatory Visit: Payer: Medicare Other | Admitting: Rheumatology

## 2018-02-09 VITALS — BP 120/79 | HR 84 | Resp 13 | Ht 64.75 in | Wt 178.0 lb

## 2018-02-09 DIAGNOSIS — M79641 Pain in right hand: Secondary | ICD-10-CM

## 2018-02-09 DIAGNOSIS — M170 Bilateral primary osteoarthritis of knee: Secondary | ICD-10-CM

## 2018-02-09 DIAGNOSIS — M79672 Pain in left foot: Secondary | ICD-10-CM

## 2018-02-09 DIAGNOSIS — M79671 Pain in right foot: Secondary | ICD-10-CM | POA: Diagnosis not present

## 2018-02-09 DIAGNOSIS — Z8669 Personal history of other diseases of the nervous system and sense organs: Secondary | ICD-10-CM

## 2018-02-09 DIAGNOSIS — G5602 Carpal tunnel syndrome, left upper limb: Secondary | ICD-10-CM

## 2018-02-09 DIAGNOSIS — M2550 Pain in unspecified joint: Secondary | ICD-10-CM

## 2018-02-09 DIAGNOSIS — G4709 Other insomnia: Secondary | ICD-10-CM

## 2018-02-09 DIAGNOSIS — E785 Hyperlipidemia, unspecified: Secondary | ICD-10-CM

## 2018-02-09 DIAGNOSIS — R768 Other specified abnormal immunological findings in serum: Secondary | ICD-10-CM | POA: Diagnosis not present

## 2018-02-09 DIAGNOSIS — Z8719 Personal history of other diseases of the digestive system: Secondary | ICD-10-CM

## 2018-02-09 DIAGNOSIS — M654 Radial styloid tenosynovitis [de Quervain]: Secondary | ICD-10-CM

## 2018-02-09 DIAGNOSIS — M79642 Pain in left hand: Secondary | ICD-10-CM

## 2018-02-09 MED ORDER — DICLOFENAC SODIUM 1 % TD GEL: TRANSDERMAL | 3 refills | Status: DC

## 2018-02-09 NOTE — Patient Instructions (Addendum)
You can try Voltaren gel as an alternative to meloxicam.  Do not use both medications together.  Hand Exercises Hand exercises can be helpful to almost anyone. These exercises can strengthen the hands, improve flexibility and movement, and increase blood flow to the hands. These results can make work and daily tasks easier. Hand exercises can be especially helpful for people who have joint pain from arthritis or have nerve damage from overuse (carpal tunnel syndrome). These exercises can also help people who have injured a hand. Most of these hand exercises are fairly gentle stretching routines. You can do them often throughout the day. Still, it is a good idea to ask your health care provider which exercises would be best for you. Warming your hands before exercise may help to reduce stiffness. You can do this with gentle massage or by placing your hands in warm water for 15 minutes. Also, make sure you pay attention to your level of hand pain as you begin an exercise routine. Exercises Knuckle bend Repeat this exercise 5-10 times with each hand. 1. Stand or sit with your arm, hand, and all five fingers pointed straight up. Make sure your wrist is straight. 2. Gently and slowly bend your fingers down and inward until the tips of your fingers are touching the tops of your palm. 3. Hold this position for a few seconds. 4. Extend your fingers out to their original position, all pointing straight up again. Finger fan Repeat this exercise 5-10 times with each hand. 1. Hold your arm and hand out in front of you. Keep your wrist straight. 2. Squeeze your hand into a fist. 3. Hold this position for a few seconds. 4. Edison Simon out, or spread apart, your hand and fingers as much as possible, stretching every joint fully. Tabletop Repeat this exercise 5-10 times with each hand. 1. Stand or sit with your arm, hand, and all five fingers pointed straight up. Make sure your wrist is straight. 2. Gently and slowly  bend your fingers at the knuckles where they meet the hand until your hand is making an upside-down L shape. Your fingers should form a tabletop. 3. Hold this position for a few seconds. 4. Extend your fingers out to their original position, all pointing straight up again. Making Os Repeat this exercise 5-10 times with each hand. 1. Stand or sit with your arm, hand, and all five fingers pointed straight up. Make sure your wrist is straight. 2. Make an O shape by touching your pointer finger to your thumb. Hold for a few seconds. Then open your hand wide. 3. Repeat this motion with each finger on your hand. Table spread Repeat this exercise 5-10 times with each hand. 1. Place your hand on a table with your palm facing down. Make sure your wrist is straight. 2. Spread your fingers out as much as possible. Hold this position for a few seconds. 3. Slide your fingers back together again. Hold for a few seconds. Ball grip Repeat this exercise 10-15 times with each hand. 1. Hold a tennis ball or another soft ball in your hand. 2. While slowly increasing pressure, squeeze the ball as hard as possible. 3. Squeeze as hard as you can for 3-5 seconds. 4. Relax and repeat.  Wrist curls Repeat this exercise 10-15 times with each hand. 1. Sit in a chair that has armrests. 2. Hold a light weight in your hand, such as a dumbbell that weighs 1-3 pounds (0.5-1.4 kg). Ask your health care provider what  weight would be best for you. 3. Rest your hand just over the end of the chair arm with your palm facing up. 4. Gently pivot your wrist up and down while holding the weight. Do not twist your wrist from side to side. Contact a health care provider if:  Your hand pain or discomfort gets much worse when you do an exercise.  Your hand pain or discomfort does not improve within 2 hours after you exercise. If you have any of these problems, stop doing these exercises right away. Do not do them again unless your  health care provider says that you can. Get help right away if:  You develop sudden, severe hand pain. If this happens, stop doing these exercises right away. Do not do them again unless your health care provider says that you can. This information is not intended to replace advice given to you by your health care provider. Make sure you discuss any questions you have with your health care provider. Document Released: 12/11/2014 Document Revised: 05/05/2017 Document Reviewed: 07/10/2014 Elsevier Interactive Patient Education  2019 [employer].

## 2018-02-09 NOTE — Addendum Note (Signed)
Addended by: Mariella Saa C on: 02/09/2018 10:51 AM   Modules accepted: Orders

## 2018-02-09 NOTE — Progress Notes (Signed)
Pharmacy Note  Patient seen by pharmacist for counseling on Voltaren gel for osteoarthritis.  Has patient tried NSAID's previously?  Yes, Mobic.  Patient on the purpose, proper use, and adverse effects of Voltaren gel including headache, increased blood pressure, and risk of GI bleed.  Instructed patient to avoid applying to open skin wound, or on areas of infection, rash, burn, or peeling skin.  Advised  patient wait at least 10 minutes before dressing or wearing gloves and wait at least 1 hour before you bathe or shower.  Counseled patient to wash hands after application and avoid contact with face/eyes.  Advised patient to apply with q-tip if applying to hands to minimize absorption on palms.  Patient given GoodRx coupon to help with cost as it is not routinely covered by insurance.  Instructed patient to not use in combination with meloxicam.  She can use either voltaren gel or meloxicam.  Patient verbalized understanding.  All questions encouraged and answered.  Instructed patient to call with any further questions or concerns.  Mariella Saa, PharmD, [facility] Clinical Pharmacist  02/09/2018 10:49 AM

## 2018-02-09 NOTE — Addendum Note (Signed)
Addended by: Bo Merino on: 02/09/2018 11:39 AM   Modules accepted: Orders

## 2018-02-15 ENCOUNTER — Encounter (HOSPITAL_BASED_OUTPATIENT_CLINIC_OR_DEPARTMENT_OTHER): Payer: Self-pay

## 2018-02-15 ENCOUNTER — Other Ambulatory Visit: Payer: Self-pay

## 2018-02-18 ENCOUNTER — Telehealth: Payer: Self-pay | Admitting: *Deleted

## 2018-02-18 NOTE — Telephone Encounter (Signed)
Copied from [place] [id number]. Topic: Referral - Request for Referral >> Feb 18, 2018  9:24 AM Scherrie Gerlach wrote: Has patient seen PCP for this complaint? no Pt would like referral to a dermatologist in [place]. Pt states she has some things on her skin she wants to get checked out.

## 2018-02-18 NOTE — Telephone Encounter (Signed)
Patient stated that she has some places on her face and back that look wart-like.  Not sure if you remember seeing these or not.    Patient will await pcp return to address

## 2018-02-19 DIAGNOSIS — H10023 Other mucopurulent conjunctivitis, bilateral: Secondary | ICD-10-CM | POA: Diagnosis not present

## 2018-02-19 DIAGNOSIS — H04553 Acquired stenosis of bilateral nasolacrimal duct: Secondary | ICD-10-CM | POA: Diagnosis not present

## 2018-02-23 ENCOUNTER — Encounter (HOSPITAL_BASED_OUTPATIENT_CLINIC_OR_DEPARTMENT_OTHER)
Admission: RE | Disposition: A | Discharge status: Home / Self Care | Payer: Self-pay | Source: Ambulatory Visit | Attending: Orthopedic Surgery

## 2018-02-23 ENCOUNTER — Encounter (HOSPITAL_BASED_OUTPATIENT_CLINIC_OR_DEPARTMENT_OTHER): Payer: Self-pay

## 2018-02-23 ENCOUNTER — Ambulatory Visit (HOSPITAL_BASED_OUTPATIENT_CLINIC_OR_DEPARTMENT_OTHER): Payer: Medicare Other | Admitting: Anesthesiology

## 2018-02-23 ENCOUNTER — Other Ambulatory Visit: Payer: Self-pay | Admitting: Family Medicine

## 2018-02-23 ENCOUNTER — Ambulatory Visit (HOSPITAL_BASED_OUTPATIENT_CLINIC_OR_DEPARTMENT_OTHER)
Admission: RE | Admit: 2018-02-23 | Discharge: 2018-02-23 | Disposition: A | Discharge status: Home / Self Care | Payer: Medicare Other | Source: Ambulatory Visit | Attending: Orthopedic Surgery | Admitting: Orthopedic Surgery

## 2018-02-23 ENCOUNTER — Other Ambulatory Visit: Payer: Self-pay

## 2018-02-23 DIAGNOSIS — Z79899 Other long term (current) drug therapy: Secondary | ICD-10-CM | POA: Diagnosis not present

## 2018-02-23 DIAGNOSIS — D229 Melanocytic nevi, unspecified: Secondary | ICD-10-CM

## 2018-02-23 DIAGNOSIS — K219 Gastro-esophageal reflux disease without esophagitis: Secondary | ICD-10-CM | POA: Insufficient documentation

## 2018-02-23 DIAGNOSIS — M1990 Unspecified osteoarthritis, unspecified site: Secondary | ICD-10-CM | POA: Diagnosis not present

## 2018-02-23 DIAGNOSIS — G5602 Carpal tunnel syndrome, left upper limb: Secondary | ICD-10-CM | POA: Diagnosis not present

## 2018-02-23 DIAGNOSIS — Z7982 Long term (current) use of aspirin: Secondary | ICD-10-CM | POA: Diagnosis not present

## 2018-02-23 HISTORY — PX: CARPAL TUNNEL RELEASE: SHX101

## 2018-02-23 SURGERY — CARPAL TUNNEL RELEASE
Anesthesia: Monitor Anesthesia Care | Site: Wrist | Laterality: Left

## 2018-02-23 MED ORDER — HYDROMORPHONE HCL 1 MG/ML IJ SOLN: 0.2500 mg | INTRAMUSCULAR | Status: DC | PRN

## 2018-02-23 MED ORDER — OXYCODONE HCL 5 MG/5ML PO SOLN: 5.0000 mg | Freq: Once | ORAL | Status: DC | PRN

## 2018-02-23 MED ORDER — FENTANYL CITRATE (PF) 100 MCG/2ML IJ SOLN
INTRAMUSCULAR | Status: AC
  Filled 2018-02-23: qty 2

## 2018-02-23 MED ORDER — MEPERIDINE HCL 25 MG/ML IJ SOLN: 6.2500 mg | INTRAMUSCULAR | Status: DC | PRN

## 2018-02-23 MED ORDER — ONDANSETRON HCL 4 MG/2ML IJ SOLN
INTRAMUSCULAR | Status: AC
  Filled 2018-02-23: qty 2

## 2018-02-23 MED ORDER — FENTANYL CITRATE (PF) 100 MCG/2ML IJ SOLN
50.0000 ug | INTRAMUSCULAR | Status: DC | PRN
  Administered 2018-02-23: 50 ug via INTRAVENOUS

## 2018-02-23 MED ORDER — LIDOCAINE HCL (PF) 0.5 % IJ SOLN
INTRAMUSCULAR | Status: DC | PRN
  Administered 2018-02-23: 35 mL via INTRAVENOUS

## 2018-02-23 MED ORDER — LACTATED RINGERS IV SOLN
INTRAVENOUS | Status: DC
  Administered 2018-02-23: 11:00:00 via INTRAVENOUS

## 2018-02-23 MED ORDER — MIDAZOLAM HCL 2 MG/2ML IJ SOLN
1.0000 mg | INTRAMUSCULAR | Status: DC | PRN
  Administered 2018-02-23: 1 mg via INTRAVENOUS

## 2018-02-23 MED ORDER — CEFAZOLIN SODIUM-DEXTROSE 2-4 GM/100ML-% IV SOLN
2.0000 g | INTRAVENOUS | Status: AC
  Administered 2018-02-23: 2 g via INTRAVENOUS

## 2018-02-23 MED ORDER — BUPIVACAINE HCL (PF) 0.25 % IJ SOLN
INTRAMUSCULAR | Status: DC | PRN
  Administered 2018-02-23: 10 mL

## 2018-02-23 MED ORDER — TRAMADOL HCL 50 MG PO TABS: 50.0000 mg | ORAL_TABLET | Freq: Four times a day (QID) | ORAL | 0 refills | Status: DC | PRN

## 2018-02-23 MED ORDER — ONDANSETRON HCL 4 MG/2ML IJ SOLN
INTRAMUSCULAR | Status: DC | PRN
  Administered 2018-02-23: 4 mg via INTRAVENOUS

## 2018-02-23 MED ORDER — MIDAZOLAM HCL 2 MG/2ML IJ SOLN
INTRAMUSCULAR | Status: AC
  Filled 2018-02-23: qty 2

## 2018-02-23 MED ORDER — SCOPOLAMINE 1 MG/3DAYS TD PT72: 1.0000 | MEDICATED_PATCH | Freq: Once | TRANSDERMAL | Status: DC | PRN

## 2018-02-23 MED ORDER — PROMETHAZINE HCL 25 MG/ML IJ SOLN: 6.2500 mg | INTRAMUSCULAR | Status: DC | PRN

## 2018-02-23 MED ORDER — CEFAZOLIN SODIUM-DEXTROSE 2-4 GM/100ML-% IV SOLN
INTRAVENOUS | Status: AC
  Filled 2018-02-23: qty 100

## 2018-02-23 MED ORDER — OXYCODONE HCL 5 MG PO TABS: 5.0000 mg | ORAL_TABLET | Freq: Once | ORAL | Status: DC | PRN

## 2018-02-23 MED ORDER — PROPOFOL 500 MG/50ML IV EMUL
INTRAVENOUS | Status: DC | PRN
  Administered 2018-02-23: 50 ug/kg/min via INTRAVENOUS

## 2018-02-23 MED ORDER — CHLORHEXIDINE GLUCONATE 4 % EX LIQD: 60.0000 mL | Freq: Once | CUTANEOUS | Status: DC

## 2018-02-23 SURGICAL SUPPLY — 32 items
BLADE SURG 15 STRL LF DISP TIS (BLADE) ×1 IMPLANT
BLADE SURG 15 STRL SS (BLADE) ×1
BNDG COHESIVE 3X5 TAN STRL LF (GAUZE/BANDAGES/DRESSINGS) ×2 IMPLANT
BNDG ESMARK 4X9 LF (GAUZE/BANDAGES/DRESSINGS) IMPLANT
BNDG GAUZE ELAST 4 BULKY (GAUZE/BANDAGES/DRESSINGS) ×2 IMPLANT
CHLORAPREP W/TINT 26ML (MISCELLANEOUS) ×2 IMPLANT
CORD BIPOLAR FORCEPS 12FT (ELECTRODE) ×2 IMPLANT
COVER BACK TABLE 60X90IN (DRAPES) ×2 IMPLANT
COVER MAYO STAND STRL (DRAPES) ×2 IMPLANT
COVER WAND RF STERILE (DRAPES) IMPLANT
CUFF TOURNIQUET SINGLE 18IN (TOURNIQUET CUFF) ×2 IMPLANT
DRAPE EXTREMITY T 121X128X90 (DISPOSABLE) ×2 IMPLANT
DRAPE SURG 17X23 STRL (DRAPES) ×2 IMPLANT
DRSG PAD ABDOMINAL 8X10 ST (GAUZE/BANDAGES/DRESSINGS) ×2 IMPLANT
GAUZE SPONGE 4X4 12PLY STRL (GAUZE/BANDAGES/DRESSINGS) ×2 IMPLANT
GAUZE XEROFORM 1X8 LF (GAUZE/BANDAGES/DRESSINGS) ×2 IMPLANT
GLOVE BIOGEL PI IND STRL 8.5 (GLOVE) ×1 IMPLANT
GLOVE BIOGEL PI INDICATOR 8.5 (GLOVE) ×1
GLOVE SURG ORTHO 8.0 STRL STRW (GLOVE) ×2 IMPLANT
GOWN STRL REUS W/ TWL LRG LVL3 (GOWN DISPOSABLE) ×1 IMPLANT
GOWN STRL REUS W/TWL LRG LVL3 (GOWN DISPOSABLE) ×1
GOWN STRL REUS W/TWL XL LVL3 (GOWN DISPOSABLE) ×2 IMPLANT
NEEDLE PRECISIONGLIDE 27X1.5 (NEEDLE) IMPLANT
NS IRRIG 1000ML POUR BTL (IV SOLUTION) ×2 IMPLANT
PACK BASIN DAY SURGERY FS (CUSTOM PROCEDURE TRAY) ×2 IMPLANT
STOCKINETTE 4X48 STRL (DRAPES) ×2 IMPLANT
SUT ETHILON 4 0 PS 2 18 (SUTURE) ×2 IMPLANT
SUT VICRYL 4-0 PS2 18IN ABS (SUTURE) IMPLANT
SYR BULB 3OZ (MISCELLANEOUS) ×2 IMPLANT
SYR CONTROL 10ML LL (SYRINGE) IMPLANT
TOWEL GREEN STERILE FF (TOWEL DISPOSABLE) ×2 IMPLANT
UNDERPAD 30X30 (UNDERPADS AND DIAPERS) ×2 IMPLANT

## 2018-02-23 NOTE — Op Note (Signed)
NAME: Kimberly Escobar MEDICAL RECORD NO: [id number] DATE OF BIRTH: 1955-09-25 FACILITY: [facility] LOCATION:  SURGERY CENTER PHYSICIAN: Wynonia Sours, MD   OPERATIVE REPORT   DATE OF PROCEDURE: 02/23/18    PREOPERATIVE DIAGNOSIS:   Carpal tunnel syndrome left hand   POSTOPERATIVE DIAGNOSIS:   Same   PROCEDURE:   Carpal tunnel release left hand   SURGEON: Daryll Brod, M.D.   ASSISTANT: none   ANESTHESIA:  Bier block with sedation and Local   INTRAVENOUS FLUIDS:  Per anesthesia flow sheet.   ESTIMATED BLOOD LOSS:  Minimal.   COMPLICATIONS:  None.   SPECIMENS:  none   TOURNIQUET TIME:    Total Tourniquet Time Documented: Forearm (Left) - 22 minutes Total: Forearm (Left) - 22 minutes    DISPOSITION:  Stable to PACU.   INDICATIONS: Patient is a 63 year old female with a history of carpal tunnel syndrome left hand with numbness and tingling.  She has undergone release of the carpal tunnel on her right side many years ago.  Nerve conductions are positive at this time.  She has elected to finally undergo surgical decompression after failure of conservative treatment.  Pre-peri-and postoperative course been discussed along with risks and complications.  She is aware there is no guarantee to the surgery the possibility of infection recurrence injury to arteries nerves tendons incomplete relief of symptoms and dystrophy.  The preoperative area the patient seen the extremity marked by both patient and surgeon antibiotic given  OPERATIVE COURSE: Patient is brought to the operating room where form based IV regional anesthetic was carried out without difficulty under the direction of the anesthesia department.  She was prepped using ChloraPrep in the supine position with a left arm free.  A three-minute dry time was allowed and a timeout taken confirming patient procedure.  A longitudinal incision was made left palm carried down through subcutaneous tissue.  Bleeders were  electrocauterized with bipolar.  The palmar fascia was split.  The superficial palmar arch was identified distally.  The flexor retinaculum was then incised after placement of retractors retracting the median nerve and flexor tendons radially after identifying the flexor tendon to the ring and small finger.  The ulnar nerve was protected ulnarly.  The flexor retinaculum was then released on its ulnar aspect.  A right angle and stool retractor placed between skin and forearm fascia the deep structures dissected free blunt the surgery was used to dissect and separate the proximal aspect of the flexor retinaculum distal forearm fascia for approximately 3 cm proximal to the wrist crease.  The canal was explored.  Persistent median artery was present.  Motor branch entered muscle distally.  An area compression of the nerve was apparent.  No further lesions were identified.  The wound was irrigated with saline.  The skin was closed interrupted 4-0 nylon sutures.  Local infiltration quarter percent bupivacaine without epinephrine was given approximately 9 cc was used.  Sterile compressive dressing with the fingers free was applied.  Deflation of the tourniquet all fingers immediately pink.  She was taken to the recovery room for observation in satisfactory condition.  She will be discharged home to return the hand center of [facility] Tylenol ibuprofen for pain with Ultram as a breakthrough.   Daryll Brod, MD Electronically signed, 02/23/18

## 2018-02-23 NOTE — Transfer of Care (Signed)
Immediate Anesthesia Transfer of Care Note  Patient: Kimberly Escobar  Procedure(s) Performed: LEFT CARPAL TUNNEL RELEASE (Left Wrist)  Patient Location: PACU  Anesthesia Type:Bier block  Level of Consciousness: awake, alert  and oriented  Airway & Oxygen Therapy: Patient Spontanous Breathing and Patient connected to face mask oxygen  Post-op Assessment: Report given to RN and Post -op Vital signs reviewed and stable  Post vital signs: Reviewed and stable  Last Vitals:  Vitals Value Taken Time  BP    Temp    Pulse 79 02/23/2018 11:56 AM  Resp 19 02/23/2018 11:56 AM  SpO2 99 % 02/23/2018 11:56 AM  Vitals shown include unvalidated device data.  Last Pain:  Vitals:   02/23/18 1030  TempSrc: Oral  PainSc: 4          Complications: No apparent anesthesia complications

## 2018-02-23 NOTE — Anesthesia Postprocedure Evaluation (Signed)
Anesthesia Post Note  Patient: Kimberly Escobar  Procedure(s) Performed: LEFT CARPAL TUNNEL RELEASE (Left Wrist)     Patient location during evaluation: PACU Anesthesia Type: MAC Level of consciousness: awake and alert Pain management: pain level controlled Vital Signs Assessment: post-procedure vital signs reviewed and stable Respiratory status: spontaneous breathing and respiratory function stable Cardiovascular status: stable Postop Assessment: no apparent nausea or vomiting Anesthetic complications: no    Last Vitals:  Vitals:   02/23/18 1200 02/23/18 1215  BP: (!) 158/78 (!) 160/82  Pulse: 77 75  Resp: 19 18  Temp:  36.6 C  SpO2: 98% 98%    Last Pain:  Vitals:   02/23/18 1215  TempSrc:   PainSc: 2                  Kimberly Escobar DANIEL

## 2018-02-23 NOTE — Brief Op Note (Signed)
02/23/2018  11:55 AM  PATIENT:  Kimberly Escobar  63 y.o. female  PRE-OPERATIVE DIAGNOSIS:  LEFT CARPAL TUNNEL SYNDROME  POST-OPERATIVE DIAGNOSIS:  LEFT CARPAL TUNNEL SYNDROME  PROCEDURE:  Procedure(s) with comments: LEFT CARPAL TUNNEL RELEASE (Left) - FAB  SURGEON:  Surgeon(s) and Role:    * Daryll Brod, MD - Primary  PHYSICIAN ASSISTANT:   ASSISTANTS: none   ANESTHESIA:   local, regional and IV sedation  EBL: 62ml  BLOOD ADMINISTERED:none  DRAINS: none   LOCAL MEDICATIONS USED:  BUPIVICAINE   SPECIMEN:  No Specimen  DISPOSITION OF SPECIMEN:  N/A  COUNTS:  YES  TOURNIQUET:   Total Tourniquet Time Documented: Forearm (Left) - 22 minutes Total: Forearm (Left) - 22 minutes   DICTATION: .Dragon Dictation  PLAN OF CARE: Discharge to home after PACU  PATIENT DISPOSITION:  PACU - hemodynamically stable.
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
## 2018-02-23 NOTE — Discharge Instructions (Addendum)

## 2018-02-23 NOTE — Telephone Encounter (Signed)
Referral placed.

## 2018-02-23 NOTE — Anesthesia Procedure Notes (Signed)
Anesthesia Regional Block: Bier block (IV Regional)   Pre-Anesthetic Checklist: ,, timeout performed, Correct Patient, Correct Site, Correct Laterality, Correct Procedure, Correct Position, site marked, Risks and benefits discussed, Surgical consent,  Pre-op evaluation,  At surgeon's request  Laterality: Left  Prep: chloraprep       Needles:  Injection technique: Single-shot  Needle Type: Other      Needle Gauge: 20     Additional Needles:   Procedures:,,,,, intact distal pulses, Esmarch exsanguination, single tourniquet utilized,  Narrative:  Start time: 02/23/2018 11:26 AM End time: 02/23/2018 11:27 AM  Performed by: With CRNAs  CRNA: Lyndee Leo, CRNA

## 2018-02-23 NOTE — Anesthesia Preprocedure Evaluation (Signed)
Anesthesia Evaluation  Patient identified by MRN, date of birth, ID band Patient awake    Reviewed: Allergy & Precautions, NPO status , Patient's Chart, lab work & pertinent test results  Airway Mallampati: I  TM Distance: >3 FB Neck ROM: Full    Dental  (+) Teeth Intact   Pulmonary    breath sounds clear to auscultation       Cardiovascular Pt. on medications  Rhythm:Regular Rate:Normal     Neuro/Psych  Headaches,    GI/Hepatic GERD  Medicated and Controlled,  Endo/Other    Renal/GU      Musculoskeletal  (+) Arthritis , Osteoarthritis,    Abdominal   Peds  Hematology   Anesthesia Other Findings   Reproductive/Obstetrics                             Anesthesia Physical  Anesthesia Plan  ASA: II  Anesthesia Plan: MAC and Bier Block   Post-op Pain Management:    Induction: Intravenous  PONV Risk Score and Plan: 2 and Ondansetron and Midazolam  Airway Management Planned: Simple Face Mask  Additional Equipment:   Intra-op Plan:   Post-operative Plan:   Informed Consent: I have reviewed the patients History and Physical, chart, labs and discussed the procedure including the risks, benefits and alternatives for the proposed anesthesia with the patient or authorized representative who has indicated his/her understanding and acceptance.       Plan Discussed with: CRNA, Anesthesiologist and Surgeon  Anesthesia Plan Comments:                                          Anesthesia Evaluation  Patient identified by MRN, date of birth, ID band Patient awake    Reviewed: Allergy & Precautions, NPO status , Patient's Chart, lab work & pertinent test results  Airway Mallampati: I  TM Distance: >3 FB Neck ROM: Full    Dental  (+) Teeth Intact   Pulmonary  breath sounds clear to auscultation        Cardiovascular Rhythm:Regular Rate:Normal      Neuro/Psych    GI/Hepatic GERD-  Medicated and Controlled,  Endo/Other    Renal/GU      Musculoskeletal   Abdominal   Peds  Hematology   Anesthesia Other Findings   Reproductive/Obstetrics                             Anesthesia Physical Anesthesia Plan  ASA: II  Anesthesia Plan: MAC and Bier Block   Post-op Pain Management:    Induction: Intravenous  Airway Management Planned: Simple Face Mask  Additional Equipment:   Intra-op Plan:   Post-operative Plan:   Informed Consent: I have reviewed the patients History and Physical, chart, labs and discussed the procedure including the risks, benefits and alternatives for the proposed anesthesia with the patient or authorized representative who has indicated his/her understanding and acceptance.     Plan Discussed with: CRNA, Anesthesiologist and Surgeon  Anesthesia Plan Comments:         Anesthesia Quick Evaluation  Anesthesia Quick Evaluation

## 2018-02-24 ENCOUNTER — Encounter (HOSPITAL_BASED_OUTPATIENT_CLINIC_OR_DEPARTMENT_OTHER): Payer: Self-pay | Admitting: Orthopedic Surgery

## 2018-02-25 NOTE — Telephone Encounter (Signed)
Patient notified that referral has been placed  
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
## 2018-03-01 ENCOUNTER — Ambulatory Visit: Payer: Medicare Other | Admitting: Family Medicine

## 2018-03-08 ENCOUNTER — Ambulatory Visit (INDEPENDENT_AMBULATORY_CARE_PROVIDER_SITE_OTHER): Payer: Medicare Other | Admitting: Nurse Practitioner

## 2018-03-08 ENCOUNTER — Encounter: Payer: Self-pay | Admitting: Nurse Practitioner

## 2018-03-08 VITALS — BP 108/68 | HR 78 | Temp 98.7°F | Ht 65.0 in | Wt 178.0 lb

## 2018-03-08 DIAGNOSIS — H1031 Unspecified acute conjunctivitis, right eye: Secondary | ICD-10-CM

## 2018-03-08 MED ORDER — BESIFLOXACIN HCL 0.6 % OP SUSP: 1.0000 [drp] | Freq: Three times a day (TID) | OPHTHALMIC | 0 refills | Status: DC

## 2018-03-08 NOTE — Progress Notes (Signed)
Subjective:  Patient ID: Kimberly Escobar, female    DOB: July 10, 1955  Age: 63 y.o. MRN: [id number]  CC: Eye Pain (pt is c/o of right eye painful to light,pink/going on 1 day/)   Eye Pain   The right eye is affected. This is a new problem. The current episode started yesterday. The problem occurs constantly. The problem has been unchanged. There was no injury mechanism. There is no known exposure to pink eye. She does not wear contacts. Associated symptoms include an eye discharge, eye redness, a foreign body sensation and itching. Pertinent negatives include no blurred vision, double vision, fever, nausea, photophobia, recent URI or vomiting. She has tried eye drops for the symptoms. The treatment provided no relief.  last eye exam by ophthalmology within last 24months, normal pressure. Diagnosed with dry eyes and eye drops prescribed(tobramycin/dexamethasone). She reports use of vitamin E oil eye both eyes yesterday.  Reviewed past Medical, Social and Family history today.  Outpatient Medications Prior to Visit  Medication Sig Dispense Refill  . acyclovir (ZOVIRAX) 400 MG tablet TAKE 1 TABLET BY MOUTH AS NEEDED 60 tablet 5  . acyclovir ointment (ZOVIRAX) 5 % Apply topically 3 hours as needed 15 g 5  . aspirin 81 MG tablet Take 81 mg by mouth as needed.     . cyclobenzaprine (FLEXERIL) 5 MG tablet Take 1 tablet (5 mg total) by mouth 3 (three) times daily as needed for muscle spasms. 30 tablet 1  . Ginger, Zingiber officinalis, (GINGER ROOT PO) Take by mouth.    Marland Kitchen ibuprofen (ADVIL,MOTRIN) 800 MG tablet as needed.     . multivitamin-lutein (OCUVITE-LUTEIN) CAPS Take 1 capsule by mouth daily.    . Pyridoxine HCl (VITAMIN B6 PO) Take 2 tablets by mouth daily. Reported on 06/14/2015    . SUMAtriptan (IMITREX) 100 MG tablet Take 1 tablet (100 mg total) by mouth every 2 (two) hours as needed for migraine. May repeat in 2 hours if headache persists or recurs. 10 tablet 0  . tobramycin-dexamethasone  (TOBRADEX) ophthalmic solution every 4 (four) hours while awake.    . traMADol (ULTRAM) 50 MG tablet Take 1 tablet (50 mg total) by mouth every 6 (six) hours as needed. 30 tablet 0  . Turmeric 500 MG CAPS Take by mouth.    . Nutritional Supplements (GRAPESEED EXTRACT PO) Take 1 tablet by mouth daily.    . traMADol (ULTRAM) 50 MG tablet Take 1 tablet (50 mg total) by mouth every 6 (six) hours as needed. (Patient not taking: Reported on 03/08/2018) 20 tablet 0  . triamcinolone (NASACORT) 55 MCG/ACT AERO nasal inhaler Place 2 sprays into the nose daily. (Patient not taking: Reported on 03/08/2018) 1 Inhaler 12   No facility-administered medications prior to visit.     ROS See HPI  Objective:  BP 108/68   Pulse 78   Temp 98.7 F (37.1 C) (Oral)   Ht 5\' 5"  (1.651 m)   Wt 178 lb (80.7 kg)   SpO2 96%   BMI 29.62 kg/m   BP Readings from Last 3 Encounters:  03/08/18 108/68  02/23/18 (!) 160/82  02/09/18 120/79    Wt Readings from Last 3 Encounters:  03/08/18 178 lb (80.7 kg)  02/23/18 176 lb 2.4 oz (79.9 kg)  02/09/18 178 lb (80.7 kg)    Physical Exam Vitals signs reviewed.  HENT:     Right Ear: Tympanic membrane, ear canal and external ear normal.     Left Ear: Tympanic membrane, ear canal  and external ear normal.     Nose: No congestion or rhinorrhea.  Eyes:     General: Lids are normal. No scleral icterus.       Right eye: Discharge present.        Left eye: No discharge or hordeolum.     Extraocular Movements: Extraocular movements intact.     Conjunctiva/sclera:     Right eye: Right conjunctiva is injected. Chemosis and exudate present. No hemorrhage.    Left eye: Left conjunctiva is not injected. Chemosis present. No exudate or hemorrhage. Neck:     Musculoskeletal: Normal range of motion and neck supple.  Cardiovascular:     Rate and Rhythm: Normal rate.  Pulmonary:     Effort: Pulmonary effort is normal.  Lymphadenopathy:     Cervical: No cervical adenopathy.    Neurological:     Mental Status: She is alert and oriented to person, place, and time.     Lab Results  Component Value Date   WBC 5.6 08/28/2017   HGB 13.3 08/28/2017   HCT 39.4 08/28/2017   PLT 276.0 08/28/2017   GLUCOSE 87 08/28/2017   CHOL 192 08/28/2017   TRIG 115.0 08/28/2017   HDL 55.90 08/28/2017   LDLDIRECT 102.0 06/14/2015   LDLCALC 113 (H) 08/28/2017   ALT 31 08/28/2017   AST 19 08/28/2017   NA 142 08/28/2017   K 4.7 08/28/2017   CL 107 08/28/2017   CREATININE 0.76 08/28/2017   BUN 16 08/28/2017   CO2 30 08/28/2017   TSH 0.79 08/28/2017    Assessment & Plan:   [name] was seen today for eye pain.  Diagnoses and all orders for this visit:  Acute conjunctivitis of right eye, unspecified acute conjunctivitis type -     Besifloxacin HCl 0.6 % SUSP; Apply 1 drop to eye 3 (three) times daily. x7days   I am having [name] start on Besifloxacin HCl. I am also having her maintain her Pyridoxine HCl (VITAMIN B6 PO), multivitamin-lutein, aspirin, ibuprofen, Nutritional Supplements (GRAPESEED EXTRACT PO), triamcinolone, SUMAtriptan, acyclovir ointment, cyclobenzaprine, tobramycin-dexamethasone, traMADol, acyclovir, Turmeric, (Ginger, Zingiber officinalis, (GINGER ROOT PO)), and traMADol.  Meds ordered this encounter  Medications  . Besifloxacin HCl 0.6 % SUSP    Sig: Apply 1 drop to eye 3 (three) times daily. x7days    Dispense:  5 mL    Refill:  0    Order Specific Question:   Supervising Provider    Answer:   Lucille Passy [[id number]]    Problem List Items Addressed This Visit    None    Visit Diagnoses    Acute conjunctivitis of right eye, unspecified acute conjunctivitis type    -  Primary   Relevant Medications   Besifloxacin HCl 0.6 % SUSP       Follow-up: Return if symptoms worsen or fail to improve.  Wilfred Lacy, NP

## 2018-03-08 NOTE — Patient Instructions (Addendum)
Stop tobramycin-dexamethasone. Start besifloxacin eye drops. Use cold compress to soothe eye if itching. Use sunshades to help with sunlight sensitivity. Return to ophthalmology if no improvement in 3-5days.  Bacterial Conjunctivitis, Adult Bacterial conjunctivitis is an infection of your conjunctiva. This is the clear membrane that covers the white part of your eye and the inner part of your eyelid. This infection can make your eye:  Red or pink.  Itchy. This condition spreads easily from person to person (is contagious) and from one eye to the other eye. What are the causes?  This condition is caused by germs (bacteria). You may get the infection if you come into close contact with: ? A person who has the infection. ? Items that have germs on them (are contaminated), such as face towels, contact lens solution, or eye makeup. What increases the risk? You are more likely to get this condition if you:  Have contact with people who have the infection.  Wear contact lenses.  Have a sinus infection.  Have had a recent eye injury or surgery.  Have a weak body defense system (immune system).  Have dry eyes. What are the signs or symptoms?   Thick, yellowish discharge from the eye.  Tearing or watery eyes.  Itchy eyes.  Burning feeling in your eyes.  Eye redness.  Swollen eyelids.  Blurred vision. How is this treated?   Antibiotic eye drops or ointment.  Antibiotic medicine taken by mouth. This is used for infections that do not get better with drops or ointment or that last more than 10 days.  Cool, wet cloths placed on the eyes.  Artificial tears used 2-6 times a day. Follow these instructions at home: Medicines  Take or apply your antibiotic medicine as told by your doctor. Do not stop taking or applying the antibiotic even if you start to feel better.  Take or apply over-the-counter and prescription medicines only as told by your doctor.  Do not touch  your eyelid with the eye-drop bottle or the ointment tube. Managing discomfort  Wipe any fluid from your eye with a warm, wet washcloth or a cotton ball.  Place a clean, cool, wet cloth on your eye. Do this for 10-20 minutes, 3-4 times per day. General instructions  Do not wear contacts until the infection is gone. Wear glasses until your doctor says it is okay to wear contacts again.  Do not wear eye makeup until the infection is gone. Throw away old eye makeup.  Change or wash your pillowcase every day.  Do not share towels or washcloths.  Wash your hands often with soap and water. Use paper towels to dry your hands.  Do not touch or rub your eyes.  Do not drive or use heavy machinery if your vision is blurred. Contact a doctor if:  You have a fever.  You do not get better after 10 days. Get help right away if:  You have a fever and your symptoms get worse all of a sudden.  You have very bad pain when you move your eye.  Your face: ? Hurts. ? Is red. ? Is swollen.  You have sudden loss of vision. Summary  Bacterial conjunctivitis is an infection of your conjunctiva.  This infection spreads easily from person to person.  Wash your hands often with soap and water. Use paper towels to dry your hands.  Take or apply your antibiotic medicine as told by your doctor.  Contact a doctor if you have a fever  or you do not get better after 10 days. This information is not intended to replace advice given to you by your health care provider. Make sure you discuss any questions you have with your health care provider. Document Released: 10/09/2007 Document Revised: 08/05/2017 Document Reviewed: 08/05/2017 Elsevier Interactive Patient Education  2019 [employer].

## 2018-03-12 ENCOUNTER — Ambulatory Visit: Payer: Medicare Other | Admitting: Rheumatology

## 2018-03-12 ENCOUNTER — Ambulatory Visit: Payer: Medicare Other | Admitting: Family Medicine

## 2018-03-12 DIAGNOSIS — H04553 Acquired stenosis of bilateral nasolacrimal duct: Secondary | ICD-10-CM | POA: Diagnosis not present

## 2018-03-12 DIAGNOSIS — H10023 Other mucopurulent conjunctivitis, bilateral: Secondary | ICD-10-CM | POA: Diagnosis not present
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
## 2018-03-16 ENCOUNTER — Encounter: Payer: Self-pay | Admitting: *Deleted

## 2018-03-16 ENCOUNTER — Ambulatory Visit (INDEPENDENT_AMBULATORY_CARE_PROVIDER_SITE_OTHER): Payer: Medicare Other | Admitting: *Deleted

## 2018-03-16 VITALS — BP 140/76 | HR 80 | Ht 65.0 in | Wt 178.6 lb

## 2018-03-16 DIAGNOSIS — Z0000 Encounter for general adult medical examination without abnormal findings: Secondary | ICD-10-CM

## 2018-03-16 NOTE — Patient Instructions (Signed)
Please schedule your next medicare wellness visit with me in 1 yr.  Continue to eat heart healthy diet (full of fruits, vegetables, whole grains, lean protein, water--limit salt, fat, and sugar intake) and increase physical activity as tolerated.  Bring a copy of your living will and/or healthcare power of attorney to your next office visit.   Kimberly Escobar , Thank you for taking time to come for your Medicare Wellness Visit. I appreciate your ongoing commitment to your health goals. Please review the following plan we discussed and let me know if I can assist you in the future.   These are the goals we discussed: Goals    . continue volunteering.       This is a list of the screening recommended for you and due dates:  Health Maintenance  Topic Date Due  . Flu Shot  08/14/2018*  . HIV Screening  08/28/2028*  . Mammogram  09/10/2018  . Pap Smear  07/29/2020  . Tetanus Vaccine  11/16/2021  . Colon Cancer Screening  06/29/2022  .  Hepatitis C: One time screening is recommended by Center for Disease Control  (CDC) for  adults born from [birth year] through 1967.   Completed  *Topic was postponed. The date shown is not the original due date.    Health Maintenance After Age 54 After age 29, you are at a higher risk for certain long-term diseases and infections as well as injuries from falls. Falls are a major cause of broken bones and head injuries in people who are older than age 44. Getting regular preventive care can help to keep you healthy and well. Preventive care includes getting regular testing and making lifestyle changes as recommended by your health care provider. Talk with your health care provider about:  Which screenings and tests you should have. A screening is a test that checks for a disease when you have no symptoms.  A diet and exercise plan that is right for you. What should I know about screenings and tests to prevent falls? Screening and testing are the best ways to find a  health problem early. Early diagnosis and treatment give you the best chance of managing medical conditions that are common after age 12. Certain conditions and lifestyle choices may make you more likely to have a fall. Your health care provider may recommend:  Regular vision checks. Poor vision and conditions such as cataracts can make you more likely to have a fall. If you wear glasses, make sure to get your prescription updated if your vision changes.  Medicine review. Work with your health care provider to regularly review all of the medicines you are taking, including over-the-counter medicines. Ask your health care provider about any side effects that may make you more likely to have a fall. Tell your health care provider if any medicines that you take make you feel dizzy or sleepy.  Osteoporosis screening. Osteoporosis is a condition that causes the bones to get weaker. This can make the bones weak and cause them to break more easily.  Blood pressure screening. Blood pressure changes and medicines to control blood pressure can make you feel dizzy.  Strength and balance checks. Your health care provider may recommend certain tests to check your strength and balance while standing, walking, or changing positions.  Foot health exam. Foot pain and numbness, as well as not wearing proper footwear, can make you more likely to have a fall.  Depression screening. You may be more likely to have a  fall if you have a fear of falling, feel emotionally low, or feel unable to do activities that you used to do.  Alcohol use screening. Using too much alcohol can affect your balance and may make you more likely to have a fall. What actions can I take to lower my risk of falls? General instructions  Talk with your health care provider about your risks for falling. Tell your health care provider if: ? You fall. Be sure to tell your health care provider about all falls, even ones that seem minor. ? You feel  dizzy, sleepy, or off-balance.  Take over-the-counter and prescription medicines only as told by your health care provider. These include any supplements.  Eat a healthy diet and maintain a healthy weight. A healthy diet includes low-fat dairy products, low-fat (lean) meats, and fiber from whole grains, beans, and lots of fruits and vegetables. Home safety  Remove any tripping hazards, such as rugs, cords, and clutter.  Install safety equipment such as grab bars in bathrooms and safety rails on stairs.  Keep rooms and walkways well-lit. Activity   Follow a regular exercise program to stay fit. This will help you maintain your balance. Ask your health care provider what types of exercise are appropriate for you.  If you need a cane or walker, use it as recommended by your health care provider.  Wear supportive shoes that have nonskid soles. Lifestyle  Do not drink alcohol if your health care provider tells you not to drink.  If you drink alcohol, limit how much you have: ? 0-1 drink a day for women. ? 0-2 drinks a day for men.  Be aware of how much alcohol is in your drink. In the [place] one drink equals one typical bottle of beer (12 oz), one-half glass of wine (5 oz), or one shot of hard liquor (1 oz).  Do not use any products that contain nicotine or tobacco, such as cigarettes and e-cigarettes. If you need help quitting, ask your health care provider. Summary  Having a healthy lifestyle and getting preventive care can help to protect your health and wellness after age 68.  Screening and testing are the best way to find a health problem early and help you avoid having a fall. Early diagnosis and treatment give you the best chance for managing medical conditions that are more common for people who are older than age 45.  Falls are a major cause of broken bones and head injuries in people who are older than age 7. Take precautions to prevent a fall at home.  Work with your  health care provider to learn what changes you can make to improve your health and wellness and to prevent falls. This information is not intended to replace advice given to you by your health care provider. Make sure you discuss any questions you have with your health care provider. Document Released: 11/12/2016 Document Revised: 11/12/2016 Document Reviewed: 11/12/2016 Elsevier Interactive Patient Education  2019 [employer].

## 2018-03-16 NOTE — Progress Notes (Signed)
Reviewed  Kyrstan Gotwalt R Lowne Chase, DO  

## 2018-03-23 ENCOUNTER — Ambulatory Visit: Payer: Medicare Other | Admitting: Family Medicine

## 2018-04-23 ENCOUNTER — Other Ambulatory Visit: Payer: Self-pay | Admitting: Family Medicine

## 2018-04-23 DIAGNOSIS — R600 Localized edema: Secondary | ICD-10-CM

## 2018-05-03 ENCOUNTER — Other Ambulatory Visit: Payer: Self-pay

## 2018-05-03 ENCOUNTER — Encounter: Payer: Self-pay | Admitting: Family Medicine

## 2018-05-03 ENCOUNTER — Ambulatory Visit (INDEPENDENT_AMBULATORY_CARE_PROVIDER_SITE_OTHER): Payer: Medicare Other | Admitting: Family Medicine

## 2018-05-03 DIAGNOSIS — L853 Xerosis cutis: Secondary | ICD-10-CM

## 2018-05-03 DIAGNOSIS — H5789 Other specified disorders of eye and adnexa: Secondary | ICD-10-CM | POA: Diagnosis not present

## 2018-05-03 MED ORDER — AMMONIUM LACTATE 12 % EX CREA: TOPICAL_CREAM | CUTANEOUS | 0 refills | Status: DC | PRN

## 2018-05-03 MED ORDER — TOBRAMYCIN-DEXAMETHASONE 0.3-0.1 % OP SUSP: 1.0000 [drp] | OPHTHALMIC | 0 refills | Status: DC
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
## 2018-05-05 ENCOUNTER — Encounter: Payer: Self-pay | Admitting: Family Medicine

## 2018-05-05 ENCOUNTER — Ambulatory Visit: Payer: Medicare Other | Admitting: Family Medicine

## 2018-05-05 ENCOUNTER — Other Ambulatory Visit: Payer: Self-pay

## 2018-05-05 VITALS — BP 121/80 | HR 79 | Temp 98.1°F | Ht 65.0 in | Wt 180.0 lb

## 2018-05-05 DIAGNOSIS — M25511 Pain in right shoulder: Secondary | ICD-10-CM | POA: Diagnosis not present

## 2018-05-05 MED ORDER — MELOXICAM 15 MG PO TABS: 15.0000 mg | ORAL_TABLET | Freq: Every day | ORAL | 2 refills | Status: DC

## 2018-05-05 NOTE — Progress Notes (Signed)
  Kimberly Escobar - 63 y.o. female MRN [id number]  Date of birth: [birth year]    SUBJECTIVE:      Chief Complaint: Right shoulder pain  HPI:  63 year old female presents with 3 months of right shoulder pain.  She denies any specific injury.  3/10 pain which is worse at night and laying on her side or with overhead movement.  Otherwise, her pain is fairly constant with activity.  Localizes her pain to the lateral shoulder and to the mid humerus.  She uses ibuprofen occasionally which is minimally helpful.  She takes Flexeril at night - helps with sleep.  She denies any bruising, erythema, or swelling.  No numbness or tingling distally.  No skin changes   ROS:     See HPI. All other reviewed systems negative.  PERTINENT  PMH / PSH FH / / SH:  Past Medical, Surgical, Social, and Family History Reviewed & Updated in the EMR.    OBJECTIVE: There were no vitals taken for this visit.  Physical Exam:  Vital signs are reviewed.  GEN: Alert and oriented, NAD Pulm: Breathing unlabored PSY: normal mood, congruent affect  MSK: Right shoulder: No obvious deformity or asymmetry. No bruising. No swelling Mild tenderness over the lateral shoulder Full ROM in flexion, abduction, internal/external rotation.  Painful arc in abduction and flexion NV intact distally Special Tests:  - Impingement: Neg Hawkins and Neers.  - Supraspinatus: Negative empty can.  4+/5 strength with pain - Infraspinatus/Teres: 5/5 strength with ER.  - Subscapularis: 5/5 strength with IR.  With pain - Biceps tendon: Negative Speeds.  Negative Yergason's  Left shoulder: No obvious deformity No tenderness over the proximal biceps Full range of motion 5/5 strength with RTC testing N/V intact distally   ASSESSMENT & PLAN:  1.  Right shoulder pain secondary to impingement.  No neurologic symptoms.  No significant weakness with rotator cuff testing  - Meloxicam 15mg  daily - Home RTC strengthening exercises - Consider  steroid injections if pain worsens or not tolerating exercises - f/u 6 weeks

## 2018-05-05 NOTE — Patient Instructions (Signed)
You have rotator cuff impingement Try to avoid painful activities (overhead activities, lifting with extended arm) as much as possible. Meloxicam 15mg  daily with food for pain and inflammation. Can take tylenol in addition to this. Subacromial injection may be beneficial to help with pain and to decrease inflammation. Consider physical therapy with transition to home exercise program. Do home exercise program with theraband and scapular stabilization exercises daily 3 sets of 10 once a day. If not improving at follow-up we will consider further imaging, injection, physical therapy, and/or nitro patches. Follow up with me in 6 weeks but call sooner if you're struggling and want to try physical therapy or a shot.

## 2018-06-14 ENCOUNTER — Encounter: Payer: Self-pay | Admitting: Family Medicine

## 2018-06-14 ENCOUNTER — Other Ambulatory Visit: Payer: Self-pay

## 2018-06-14 ENCOUNTER — Ambulatory Visit: Payer: Medicare Other | Admitting: Family Medicine

## 2018-06-14 VITALS — BP 132/80 | HR 90 | Ht 65.0 in | Wt 180.0 lb

## 2018-06-14 DIAGNOSIS — M79602 Pain in left arm: Secondary | ICD-10-CM | POA: Diagnosis not present

## 2018-06-14 MED ORDER — PREDNISONE 10 MG PO TABS: ORAL_TABLET | ORAL | 0 refills | Status: DC
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
## 2018-06-14 NOTE — Patient Instructions (Signed)
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 Wear elbow sleeve as often as possible during the day and try to keep from bending your elbow fully as this will aggravate the issue. Stop the aleve while you're on the prednisone. Take prednisone dose pack x 6 days as directed. Ok to take tylenol, your B6 and curcumin with the prednisone. Let me know how you're doing in 1-2 weeks.

## 2018-06-16 ENCOUNTER — Ambulatory Visit: Payer: Medicare Other | Admitting: Family Medicine

## 2018-06-16 DIAGNOSIS — H04552 Acquired stenosis of left nasolacrimal duct: Secondary | ICD-10-CM | POA: Diagnosis not present

## 2018-06-16 DIAGNOSIS — H04551 Acquired stenosis of right nasolacrimal duct: Secondary | ICD-10-CM | POA: Diagnosis not present

## 2018-06-16 DIAGNOSIS — H04123 Dry eye syndrome of bilateral lacrimal glands: Secondary | ICD-10-CM | POA: Diagnosis not present

## 2018-06-16 DIAGNOSIS — H04121 Dry eye syndrome of right lacrimal gland: Secondary | ICD-10-CM | POA: Diagnosis not present

## 2018-06-16 DIAGNOSIS — H04553 Acquired stenosis of bilateral nasolacrimal duct: Secondary | ICD-10-CM | POA: Diagnosis not present

## 2018-07-12 ENCOUNTER — Ambulatory Visit (INDEPENDENT_AMBULATORY_CARE_PROVIDER_SITE_OTHER): Payer: Medicare Other | Admitting: Family Medicine

## 2018-07-12 ENCOUNTER — Other Ambulatory Visit: Payer: Self-pay

## 2018-07-12 ENCOUNTER — Encounter: Payer: Self-pay | Admitting: Family Medicine

## 2018-07-12 VITALS — BP 103/73 | Ht 65.0 in | Wt 179.0 lb

## 2018-07-12 DIAGNOSIS — M79675 Pain in left toe(s): Secondary | ICD-10-CM

## 2018-07-12 NOTE — Patient Instructions (Signed)
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 Tylenol 500mg  1-2 tabs three times a day as needed. Wear supportive shoes when up and walking around. Avoid sandals, flip flops until this calms down. voltaren gel up to 4 times a day topically. Icing 15 minutes at a time 3-4 times a day. Can consider x-rays, injection if this doesn't improve as expected over next 2-3 weeks. Follow up with me in 1 month or as needed.
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
## 2018-07-13 ENCOUNTER — Encounter: Payer: Self-pay | Admitting: Family Medicine

## 2018-08-11 ENCOUNTER — Other Ambulatory Visit: Payer: Self-pay

## 2018-08-11 ENCOUNTER — Encounter: Payer: Self-pay | Admitting: Family Medicine

## 2018-08-11 ENCOUNTER — Ambulatory Visit (INDEPENDENT_AMBULATORY_CARE_PROVIDER_SITE_OTHER): Payer: Medicare Other | Admitting: Family Medicine

## 2018-08-11 VITALS — BP 140/84 | Ht 65.0 in | Wt 180.0 lb

## 2018-08-11 DIAGNOSIS — M25512 Pain in left shoulder: Secondary | ICD-10-CM | POA: Diagnosis not present

## 2018-08-11 DIAGNOSIS — M7541 Impingement syndrome of right shoulder: Secondary | ICD-10-CM | POA: Diagnosis not present

## 2018-08-11 DIAGNOSIS — M25511 Pain in right shoulder: Secondary | ICD-10-CM
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
## 2018-08-11 NOTE — Patient Instructions (Signed)
Your shoulder pain is caused by subacromial impingement. This leads to irritation of the rotator cuff tendons. -We will refer you to physical therapy to learn exercises for your shoulder -You may continue to take anti-inflammatories as needed for your shoulder pain -If you would like an injection in your shoulder at a later date, let us know and we can make an appointment to do this. A shoulder injection will not fix the underlying problem, but it will provide pain relief to reduce nighttime pain and help you work on the physical therapy exercises

## 2018-08-16 ENCOUNTER — Ambulatory Visit: Payer: Medicare Other | Attending: Family Medicine | Admitting: Physical Therapy

## 2018-08-16 ENCOUNTER — Other Ambulatory Visit: Payer: Self-pay

## 2018-08-16 ENCOUNTER — Encounter: Payer: Self-pay | Admitting: Physical Therapy

## 2018-08-16 DIAGNOSIS — M6281 Muscle weakness (generalized): Secondary | ICD-10-CM

## 2018-08-16 DIAGNOSIS — G8929 Other chronic pain: Secondary | ICD-10-CM | POA: Diagnosis not present

## 2018-08-16 DIAGNOSIS — R29898 Other symptoms and signs involving the musculoskeletal system: Secondary | ICD-10-CM

## 2018-08-16 DIAGNOSIS — M25511 Pain in right shoulder: Secondary | ICD-10-CM | POA: Insufficient documentation
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
## 2018-08-18 ENCOUNTER — Ambulatory Visit: Payer: Medicare Other

## 2018-08-18 ENCOUNTER — Other Ambulatory Visit: Payer: Self-pay

## 2018-08-18 DIAGNOSIS — M6281 Muscle weakness (generalized): Secondary | ICD-10-CM | POA: Diagnosis not present

## 2018-08-18 DIAGNOSIS — G8929 Other chronic pain: Secondary | ICD-10-CM

## 2018-08-18 DIAGNOSIS — R29898 Other symptoms and signs involving the musculoskeletal system: Secondary | ICD-10-CM

## 2018-08-18 DIAGNOSIS — M25511 Pain in right shoulder: Secondary | ICD-10-CM | POA: Diagnosis not present
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
## 2018-08-23 ENCOUNTER — Ambulatory Visit: Payer: Medicare Other | Admitting: Physical Therapy

## 2018-08-23 ENCOUNTER — Encounter: Payer: Self-pay | Admitting: Physical Therapy

## 2018-08-23 ENCOUNTER — Other Ambulatory Visit: Payer: Self-pay

## 2018-08-23 DIAGNOSIS — R29898 Other symptoms and signs involving the musculoskeletal system: Secondary | ICD-10-CM | POA: Diagnosis not present

## 2018-08-23 DIAGNOSIS — M6281 Muscle weakness (generalized): Secondary | ICD-10-CM | POA: Diagnosis not present

## 2018-08-23 DIAGNOSIS — G8929 Other chronic pain: Secondary | ICD-10-CM

## 2018-08-23 DIAGNOSIS — M25511 Pain in right shoulder: Secondary | ICD-10-CM | POA: Diagnosis not present
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
## 2018-08-26 ENCOUNTER — Ambulatory Visit: Payer: Medicare Other

## 2018-08-26 ENCOUNTER — Other Ambulatory Visit: Payer: Self-pay

## 2018-08-26 DIAGNOSIS — R29898 Other symptoms and signs involving the musculoskeletal system: Secondary | ICD-10-CM | POA: Diagnosis not present

## 2018-08-26 DIAGNOSIS — G8929 Other chronic pain: Secondary | ICD-10-CM

## 2018-08-26 DIAGNOSIS — M6281 Muscle weakness (generalized): Secondary | ICD-10-CM | POA: Diagnosis not present

## 2018-08-26 DIAGNOSIS — M25511 Pain in right shoulder: Secondary | ICD-10-CM | POA: Diagnosis not present
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
## 2018-08-30 ENCOUNTER — Other Ambulatory Visit: Payer: Self-pay

## 2018-08-30 ENCOUNTER — Ambulatory Visit: Payer: Medicare Other | Admitting: Physical Therapy

## 2018-08-30 ENCOUNTER — Encounter: Payer: Self-pay | Admitting: Physical Therapy

## 2018-08-30 DIAGNOSIS — R29898 Other symptoms and signs involving the musculoskeletal system: Secondary | ICD-10-CM | POA: Diagnosis not present

## 2018-08-30 DIAGNOSIS — M6281 Muscle weakness (generalized): Secondary | ICD-10-CM

## 2018-08-30 DIAGNOSIS — G8929 Other chronic pain: Secondary | ICD-10-CM | POA: Diagnosis not present

## 2018-08-30 DIAGNOSIS — M25511 Pain in right shoulder: Secondary | ICD-10-CM | POA: Diagnosis not present

## 2018-08-30 NOTE — Patient Instructions (Signed)
Access Code: [email or web address]
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
## 2018-09-02 ENCOUNTER — Other Ambulatory Visit (HOSPITAL_BASED_OUTPATIENT_CLINIC_OR_DEPARTMENT_OTHER): Payer: Self-pay | Admitting: Family Medicine

## 2018-09-02 ENCOUNTER — Ambulatory Visit: Payer: Medicare Other

## 2018-09-02 ENCOUNTER — Other Ambulatory Visit: Payer: Self-pay

## 2018-09-02 DIAGNOSIS — Z1231 Encounter for screening mammogram for malignant neoplasm of breast: Secondary | ICD-10-CM

## 2018-09-02 DIAGNOSIS — G8929 Other chronic pain: Secondary | ICD-10-CM | POA: Diagnosis not present

## 2018-09-02 DIAGNOSIS — R29898 Other symptoms and signs involving the musculoskeletal system: Secondary | ICD-10-CM

## 2018-09-02 DIAGNOSIS — M6281 Muscle weakness (generalized): Secondary | ICD-10-CM

## 2018-09-02 DIAGNOSIS — M25511 Pain in right shoulder: Secondary | ICD-10-CM | POA: Diagnosis not present
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
## 2018-09-06 ENCOUNTER — Other Ambulatory Visit: Payer: Self-pay

## 2018-09-06 ENCOUNTER — Ambulatory Visit: Payer: Medicare Other

## 2018-09-06 DIAGNOSIS — R29898 Other symptoms and signs involving the musculoskeletal system: Secondary | ICD-10-CM

## 2018-09-06 DIAGNOSIS — G8929 Other chronic pain: Secondary | ICD-10-CM | POA: Diagnosis not present

## 2018-09-06 DIAGNOSIS — M6281 Muscle weakness (generalized): Secondary | ICD-10-CM | POA: Diagnosis not present

## 2018-09-06 DIAGNOSIS — M25511 Pain in right shoulder: Secondary | ICD-10-CM | POA: Diagnosis not present
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
## 2018-09-08 ENCOUNTER — Other Ambulatory Visit: Payer: Self-pay

## 2018-09-08 ENCOUNTER — Encounter: Payer: Self-pay | Admitting: Physical Therapy

## 2018-09-08 ENCOUNTER — Ambulatory Visit: Payer: Medicare Other | Admitting: Physical Therapy

## 2018-09-08 DIAGNOSIS — M6281 Muscle weakness (generalized): Secondary | ICD-10-CM

## 2018-09-08 DIAGNOSIS — R29898 Other symptoms and signs involving the musculoskeletal system: Secondary | ICD-10-CM | POA: Diagnosis not present

## 2018-09-08 DIAGNOSIS — G8929 Other chronic pain: Secondary | ICD-10-CM

## 2018-09-08 DIAGNOSIS — M25511 Pain in right shoulder: Secondary | ICD-10-CM | POA: Diagnosis not present
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
## 2018-09-09 ENCOUNTER — Ambulatory Visit (INDEPENDENT_AMBULATORY_CARE_PROVIDER_SITE_OTHER): Payer: Medicare Other | Admitting: Family Medicine

## 2018-09-09 ENCOUNTER — Other Ambulatory Visit: Payer: Self-pay

## 2018-09-09 ENCOUNTER — Encounter: Payer: Self-pay | Admitting: Family Medicine

## 2018-09-09 VITALS — BP 118/80 | HR 90 | Temp 97.8°F | Resp 18 | Ht 65.0 in | Wt 177.8 lb

## 2018-09-09 DIAGNOSIS — M25512 Pain in left shoulder: Secondary | ICD-10-CM | POA: Diagnosis not present

## 2018-09-09 DIAGNOSIS — R600 Localized edema: Secondary | ICD-10-CM | POA: Diagnosis not present

## 2018-09-09 DIAGNOSIS — E785 Hyperlipidemia, unspecified: Secondary | ICD-10-CM

## 2018-09-09 DIAGNOSIS — Z8619 Personal history of other infectious and parasitic diseases: Secondary | ICD-10-CM | POA: Diagnosis not present

## 2018-09-09 DIAGNOSIS — Z0000 Encounter for general adult medical examination without abnormal findings: Secondary | ICD-10-CM

## 2018-09-09 LAB — COMPREHENSIVE METABOLIC PANEL
ALT: 23 U/L (ref 0–35)
AST: 14 U/L (ref 0–37)
Albumin: 4.4 g/dL (ref 3.5–5.2)
Alkaline Phosphatase: 66 U/L (ref 39–117)
BUN: 19 mg/dL (ref 6–23)
CO2: 29 mEq/L (ref 19–32)
Calcium: 9.7 mg/dL (ref 8.4–10.5)
Chloride: 106 mEq/L (ref 96–112)
Creatinine, Ser: 0.77 mg/dL (ref 0.40–1.20)
GFR: 91.04 mL/min (ref >60.00)
Glucose, Bld: 92 mg/dL (ref 70–99)
Potassium: 4 mEq/L (ref 3.5–5.1)
Sodium: 141 mEq/L (ref 135–145)
Total Bilirubin: 0.3 mg/dL (ref 0.2–1.2)
Total Protein: 7.4 g/dL (ref 6.0–8.3)

## 2018-09-09 LAB — CBC WITH DIFFERENTIAL/PLATELET
Basophils Absolute: 0.1 10*3/uL (ref 0.0–0.1)
Basophils Relative: 0.9 % (ref 0.0–3.0)
Eosinophils Absolute: 0.1 10*3/uL (ref 0.0–0.7)
Eosinophils Relative: 1.7 % (ref 0.0–5.0)
HCT: 39.2 % (ref 36.0–46.0)
Hemoglobin: 13.2 g/dL (ref 12.0–15.0)
Lymphocytes Relative: 38.5 % (ref 12.0–46.0)
Lymphs Abs: 2.2 10*3/uL (ref 0.7–4.0)
MCHC: 33.6 g/dL (ref 30.0–36.0)
MCV: 95.6 fl (ref 78.0–100.0)
Monocytes Absolute: 0.5 10*3/uL (ref 0.1–1.0)
Monocytes Relative: 9 % (ref 3.0–12.0)
Neutro Abs: 2.9 10*3/uL (ref 1.4–7.7)
Neutrophils Relative %: 49.9 % (ref 43.0–77.0)
Platelets: 266 10*3/uL (ref 150.0–400.0)
RBC: 4.1 Mil/uL (ref 3.87–5.11)
RDW: 13.3 % (ref 11.5–15.5)
WBC: 5.8 10*3/uL (ref 4.0–10.5)

## 2018-09-09 LAB — LIPID PANEL
Cholesterol: 202 mg/dL — ABNORMAL HIGH (ref 0–200)
HDL: 49.8 mg/dL (ref >39.00)
LDL Cholesterol: 128 mg/dL — ABNORMAL HIGH (ref 0–99)
NonHDL: 151.93
Total CHOL/HDL Ratio: 4
Triglycerides: 119 mg/dL (ref 0.0–149.0)
VLDL: 23.8 mg/dL (ref 0.0–40.0)

## 2018-09-09 MED ORDER — ACYCLOVIR 400 MG PO TABS: 400.0000 mg | ORAL_TABLET | ORAL | 5 refills | Status: DC | PRN

## 2018-09-09 MED ORDER — ACYCLOVIR 5 % EX OINT: TOPICAL_OINTMENT | CUTANEOUS | 5 refills | Status: DC

## 2018-09-09 MED ORDER — HYDROCHLOROTHIAZIDE 25 MG PO TABS: 25.0000 mg | ORAL_TABLET | Freq: Every day | ORAL | 3 refills | Status: DC | PRN

## 2018-09-09 MED ORDER — CYCLOBENZAPRINE HCL 10 MG PO TABS: 10.0000 mg | ORAL_TABLET | Freq: Three times a day (TID) | ORAL | 0 refills | Status: DC | PRN

## 2018-09-09 NOTE — Progress Notes (Signed)
Subjective:     Kimberly Escobar is a 63 y.o. female and is here for a comprehensive physical exam. The patient reports no problems.  She needs refills   Social History   Socioeconomic History  . Marital status: Married    Spouse name: Not on file  . Number of children: 4  . Years of education: Not on file  . Highest education level: Not on file  Occupational History  . Occupation: housewife    Employer: OTHER  Social Needs  . Financial resource strain: Not on file  . Food insecurity    Worry: Not on file    Inability: Not on file  . Transportation needs    Medical: Not on file    Non-medical: Not on file  Tobacco Use  . Smoking status: Never Smoker  . Smokeless tobacco: Never Used  Substance and Sexual Activity  . Alcohol use: Yes    Comment: 2x/ week  . Drug use: No  . Sexual activity: Yes    Partners: Male    Birth control/protection: None  Lifestyle  . Physical activity    Days per week: Not on file    Minutes per session: Not on file  . Stress: Not on file  Relationships  . Social Herbalist on phone: Not on file    Gets together: Not on file    Attends religious service: Not on file    Active member of club or organization: Not on file    Attends meetings of clubs or organizations: Not on file    Relationship status: Not on file  . Intimate partner violence    Fear of current or ex partner: Not on file    Emotionally abused: Not on file    Physically abused: Not on file    Forced sexual activity: Not on file  Other Topics Concern  . Not on file  Social History Narrative   Exercise --no secondary to plantar fascitis   Health Maintenance  Topic Date Due  . INFLUENZA VACCINE  08/14/2018  . HIV Screening  08/28/2028 (Originally 09/14/1970)  . MAMMOGRAM  09/10/2018  . PAP SMEAR-Modifier  07/29/2020  . TETANUS/TDAP  11/16/2021  . COLONOSCOPY  06/29/2022  . Hepatitis C Screening  Completed    The following portions of the patient's history  were reviewed and updated as appropriate:  She  has a past medical history of Acute costochondritis, Arthritis, Back pain, Carpal tunnel syndrome of right wrist, Environmental allergies, Migraines, Plantar fasciitis, and Wears glasses. She does not have any pertinent problems on file. She  has a past surgical history that includes Cesarean section; Colonoscopy; Upper gastrointestinal endoscopy; Tubal ligation; Carpal tunnel release (Right, 02/02/2014); and Carpal tunnel release (Left, 02/23/2018). Her family history includes Arthritis in an other family member; Breast cancer (age of onset: 44) in her sister; Cancer in her brother; Diabetes in her brother; Heart disease in her mother and sister; Hypertension in her brother, brother, mother, and sister; Stroke in her sister; Stroke (age of onset: 53) in her sister. She  reports that she has never smoked. She has never used smokeless tobacco. She reports current alcohol use. She reports that she does not use drugs. She has a current medication list which includes the following prescription(s): acyclovir, acyclovir ointment, aspirin, ginger (zingiber officinalis), hydrochlorothiazide, multivitamin-lutein, nutritional supplements, pyridoxine hcl, tobramycin-dexamethasone, tramadol, triamcinolone, turmeric, and cyclobenzaprine. Current Outpatient Medications on File Prior to Visit  Medication Sig Dispense Refill  . aspirin  81 MG tablet Take 81 mg by mouth as needed.     . Ginger, Zingiber officinalis, (GINGER ROOT PO) Take by mouth.    . multivitamin-lutein (OCUVITE-LUTEIN) CAPS Take 1 capsule by mouth daily.    . Nutritional Supplements (GRAPESEED EXTRACT PO) Take 1 tablet by mouth daily.    . Pyridoxine HCl (VITAMIN B6 PO) Take 2 tablets by mouth daily. Reported on 06/14/2015    . tobramycin-dexamethasone (TOBRADEX) ophthalmic solution Place 1 drop into both eyes every 4 (four) hours while awake. 5 mL 0  . traMADol (ULTRAM) 50 MG tablet Take 1 tablet (50 mg  total) by mouth every 6 (six) hours as needed. 20 tablet 0  . triamcinolone (NASACORT) 55 MCG/ACT AERO nasal inhaler Place 2 sprays into the nose daily. 1 Inhaler 12  . Turmeric 500 MG CAPS Take by mouth.     No current facility-administered medications on file prior to visit.    She is allergic to codeine..  Review of Systems Review of Systems  Constitutional: Negative for activity change, appetite change and fatigue.  HENT: Negative for hearing loss, congestion, tinnitus and ear discharge.  dentist q69m Eyes: Negative for visual disturbance (see optho q1y -- vision corrected to 20/20 with glasses).  Respiratory: Negative for cough, chest tightness and shortness of breath.   Cardiovascular: Negative for chest pain, palpitations and leg swelling.  Gastrointestinal: Negative for abdominal pain, diarrhea, constipation and abdominal distention.  Genitourinary: Negative for urgency, frequency, decreased urine volume and difficulty urinating.  Musculoskeletal: Negative for back pain, arthralgias and gait problem.  Skin: Negative for color change, pallor and rash.  Neurological: Negative for dizziness, light-headedness, numbness and headaches.  Hematological: Negative for adenopathy. Does not bruise/bleed easily.  Psychiatric/Behavioral: Negative for suicidal ideas, confusion, sleep disturbance, self-injury, dysphoric mood, decreased concentration and agitation.       Objective:    BP 118/80 (BP Location: Right Arm, Patient Position: Sitting, Cuff Size: Normal)   Pulse 90   Temp 97.8 F (36.6 C) (Oral)   Resp 18   Ht 5\' 5"  (1.651 m)   Wt 177 lb 12.8 oz (80.6 kg)   SpO2 98%   BMI 29.59 kg/m  General appearance: alert, cooperative, appears stated age and no distress Head: Normocephalic, without obvious abnormality, atraumatic Eyes: conjunctivae/corneas clear. PERRL, EOM's intact. Fundi benign. Ears: normal TM's and external ear canals both ears Nose: Nares normal. Septum midline.  Mucosa normal. No drainage or sinus tenderness. Throat: lips, mucosa, and tongue normal; teeth and gums normal Neck: no adenopathy, no carotid bruit, no JVD, supple, symmetrical, trachea midline and thyroid not enlarged, symmetric, no tenderness/mass/nodules Back: symmetric, no curvature. ROM normal. No CVA tenderness. Lungs: clear to auscultation bilaterally Breasts: normal appearance, no masses or tenderness Heart: regular rate and rhythm, S1, S2 normal, no murmur, click, rub or gallop Abdomen: soft, non-tender; bowel sounds normal; no masses,  no organomegaly Pelvic: deferred Extremities: extremities normal, atraumatic, no cyanosis or edema Pulses: 2+ and symmetric Skin: Skin color, texture, turgor normal. No rashes or lesions Lymph nodes: Cervical, supraclavicular, and axillary nodes normal. Neurologic: Alert and oriented X 3, normal strength and tone. Normal symmetric reflexes. Normal coordination and gait    Assessment:    Healthy female exam.      Plan:     ghm urd Check labs  See After Visit Summary for Counseling Recommendations    1. Acute pain of left shoulder Stable F/u ortho - cyclobenzaprine (FLEXERIL) 10 MG tablet; Take 1 tablet (10 mg  total) by mouth 3 (three) times daily as needed for muscle spasms.  Dispense: 30 tablet; Refill: 0  2. Bilateral edema of lower extremity Stable--  con't hctz  - hydrochlorothiazide (HYDRODIURIL) 25 MG tablet; Take 1 tablet (25 mg total) by mouth daily as needed.  Dispense: 90 tablet; Refill: 3  3. H/O cold sores stable - acyclovir ointment (ZOVIRAX) 5 %; Apply topically 3 hours as needed  Dispense: 15 g; Refill: 5 - acyclovir (ZOVIRAX) 400 MG tablet; Take 1 tablet (400 mg total) by mouth as needed.  Dispense: 60 tablet; Refill: 5  4. Hyperlipidemia, unspecified hyperlipidemia type Tolerating statin, encouraged heart healthy diet, avoid trans fats, minimize simple carbs and saturated fats. Increase exercise as tolerated - Lipid  panel - CBC with Differential/Platelet - Comprehensive metabolic panel

## 2018-09-09 NOTE — Patient Instructions (Signed)

## 2018-09-10 ENCOUNTER — Ambulatory Visit (HOSPITAL_BASED_OUTPATIENT_CLINIC_OR_DEPARTMENT_OTHER)
Admission: RE | Admit: 2018-09-10 | Discharge: 2018-09-10 | Disposition: A | Discharge status: Home / Self Care | Payer: Medicare Other | Source: Ambulatory Visit | Attending: Family Medicine | Admitting: Family Medicine

## 2018-09-10 DIAGNOSIS — Z1231 Encounter for screening mammogram for malignant neoplasm of breast: Secondary | ICD-10-CM | POA: Insufficient documentation

## 2018-09-13 ENCOUNTER — Ambulatory Visit: Payer: Medicare Other | Admitting: Physical Therapy

## 2018-09-14 ENCOUNTER — Encounter: Payer: Self-pay | Admitting: *Deleted

## 2018-09-16 ENCOUNTER — Ambulatory Visit: Payer: Medicare Other | Attending: Family Medicine | Admitting: Physical Therapy

## 2018-09-16 ENCOUNTER — Other Ambulatory Visit: Payer: Self-pay

## 2018-09-16 ENCOUNTER — Encounter: Payer: Self-pay | Admitting: Physical Therapy

## 2018-09-16 DIAGNOSIS — M6281 Muscle weakness (generalized): Secondary | ICD-10-CM | POA: Diagnosis not present

## 2018-09-16 DIAGNOSIS — G8929 Other chronic pain: Secondary | ICD-10-CM | POA: Diagnosis not present

## 2018-09-16 DIAGNOSIS — M25511 Pain in right shoulder: Secondary | ICD-10-CM | POA: Diagnosis not present

## 2018-09-16 DIAGNOSIS — R29898 Other symptoms and signs involving the musculoskeletal system: Secondary | ICD-10-CM
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
## 2018-09-21 ENCOUNTER — Ambulatory Visit: Payer: Medicare Other

## 2018-09-22 ENCOUNTER — Other Ambulatory Visit: Payer: Self-pay

## 2018-09-22 ENCOUNTER — Ambulatory Visit (INDEPENDENT_AMBULATORY_CARE_PROVIDER_SITE_OTHER): Payer: Medicare Other | Admitting: Family Medicine

## 2018-09-22 ENCOUNTER — Encounter: Payer: Self-pay | Admitting: Family Medicine

## 2018-09-22 VITALS — BP 110/80 | Ht 65.0 in | Wt 177.0 lb

## 2018-09-22 DIAGNOSIS — M79675 Pain in left toe(s): Secondary | ICD-10-CM

## 2018-09-22 DIAGNOSIS — M7541 Impingement syndrome of right shoulder: Secondary | ICD-10-CM

## 2018-09-22 NOTE — Patient Instructions (Signed)
You're doing great! A lot of your pain is due to arthritis. These are the different medications you can take for this: Tylenol 500mg  1-2 tabs three times a day for pain. Capsaicin, aspercreme, or biofreeze topically up to four times a day may also help with pain. Continue the curcumin - consider adding Boswellia extract and/or pycnogenol Continue voltaren gel as you have been. Cortisone injections are an option if specific joints become very painful. Shoe inserts with good arch support may be helpful - spencos and superfeet have good over the counter insoles. Heat or ice 15 minutes at a time 3-4 times a day as needed to help with pain. Water aerobics and cycling with low resistance are the best two types of exercise for arthritis though any exercise is ok as long as it doesn't worsen the pain.  Continue with the physical therapy for your shoulder and transition to home exercise program. Consider injection, nitro patches only if not improving. Follow up with me in 6 weeks or as needed if you're doing well.
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
## 2018-09-23 ENCOUNTER — Ambulatory Visit: Payer: Medicare Other | Admitting: Physical Therapy

## 2018-09-23 ENCOUNTER — Encounter: Payer: Self-pay | Admitting: Physical Therapy

## 2018-09-23 DIAGNOSIS — R29898 Other symptoms and signs involving the musculoskeletal system: Secondary | ICD-10-CM | POA: Diagnosis not present

## 2018-09-23 DIAGNOSIS — M25511 Pain in right shoulder: Secondary | ICD-10-CM | POA: Diagnosis not present

## 2018-09-23 DIAGNOSIS — G8929 Other chronic pain: Secondary | ICD-10-CM | POA: Diagnosis not present

## 2018-09-23 DIAGNOSIS — M6281 Muscle weakness (generalized): Secondary | ICD-10-CM | POA: Diagnosis not present

## 2018-09-23 NOTE — Patient Instructions (Addendum)
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
## 2018-09-27 ENCOUNTER — Encounter: Payer: Self-pay | Admitting: Physical Therapy

## 2018-09-27 ENCOUNTER — Ambulatory Visit: Payer: Medicare Other | Admitting: Physical Therapy

## 2018-09-27 ENCOUNTER — Other Ambulatory Visit: Payer: Self-pay

## 2018-09-27 DIAGNOSIS — M6281 Muscle weakness (generalized): Secondary | ICD-10-CM | POA: Diagnosis not present

## 2018-09-27 DIAGNOSIS — R29898 Other symptoms and signs involving the musculoskeletal system: Secondary | ICD-10-CM

## 2018-09-27 DIAGNOSIS — G8929 Other chronic pain: Secondary | ICD-10-CM | POA: Diagnosis not present

## 2018-09-27 DIAGNOSIS — M25511 Pain in right shoulder: Secondary | ICD-10-CM | POA: Diagnosis not present
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
## 2018-09-29 ENCOUNTER — Encounter: Payer: Self-pay | Admitting: Physical Therapy

## 2018-09-29 ENCOUNTER — Other Ambulatory Visit: Payer: Self-pay

## 2018-09-29 ENCOUNTER — Ambulatory Visit: Payer: Medicare Other | Admitting: Physical Therapy

## 2018-09-29 DIAGNOSIS — R29898 Other symptoms and signs involving the musculoskeletal system: Secondary | ICD-10-CM | POA: Diagnosis not present

## 2018-09-29 DIAGNOSIS — M25511 Pain in right shoulder: Secondary | ICD-10-CM | POA: Diagnosis not present

## 2018-09-29 DIAGNOSIS — G8929 Other chronic pain: Secondary | ICD-10-CM | POA: Diagnosis not present

## 2018-09-29 DIAGNOSIS — M6281 Muscle weakness (generalized): Secondary | ICD-10-CM | POA: Diagnosis not present
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
## 2018-10-04 ENCOUNTER — Other Ambulatory Visit: Payer: Self-pay

## 2018-10-04 ENCOUNTER — Ambulatory Visit: Payer: Medicare Other

## 2018-10-04 DIAGNOSIS — G8929 Other chronic pain: Secondary | ICD-10-CM

## 2018-10-04 DIAGNOSIS — M6281 Muscle weakness (generalized): Secondary | ICD-10-CM | POA: Diagnosis not present

## 2018-10-04 DIAGNOSIS — R29898 Other symptoms and signs involving the musculoskeletal system: Secondary | ICD-10-CM | POA: Diagnosis not present

## 2018-10-04 DIAGNOSIS — M25511 Pain in right shoulder: Secondary | ICD-10-CM | POA: Diagnosis not present
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
## 2018-10-06 ENCOUNTER — Ambulatory Visit: Payer: Medicare Other | Admitting: Physical Therapy

## 2018-10-06 ENCOUNTER — Other Ambulatory Visit: Payer: Self-pay

## 2018-10-06 ENCOUNTER — Encounter: Payer: Self-pay | Admitting: Physical Therapy

## 2018-10-06 DIAGNOSIS — R29898 Other symptoms and signs involving the musculoskeletal system: Secondary | ICD-10-CM | POA: Diagnosis not present

## 2018-10-06 DIAGNOSIS — G8929 Other chronic pain: Secondary | ICD-10-CM

## 2018-10-06 DIAGNOSIS — M25511 Pain in right shoulder: Secondary | ICD-10-CM | POA: Diagnosis not present

## 2018-10-06 DIAGNOSIS — M6281 Muscle weakness (generalized): Secondary | ICD-10-CM | POA: Diagnosis not present
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
## 2018-10-07 ENCOUNTER — Ambulatory Visit: Payer: Self-pay

## 2018-10-07 ENCOUNTER — Ambulatory Visit (INDEPENDENT_AMBULATORY_CARE_PROVIDER_SITE_OTHER): Payer: Medicare Other | Admitting: Rheumatology

## 2018-10-07 ENCOUNTER — Other Ambulatory Visit: Payer: Self-pay

## 2018-10-07 ENCOUNTER — Encounter: Payer: Self-pay | Admitting: Rheumatology

## 2018-10-07 VITALS — BP 140/83 | HR 71 | Resp 16 | Ht 65.0 in | Wt 180.8 lb

## 2018-10-07 DIAGNOSIS — M2550 Pain in unspecified joint: Secondary | ICD-10-CM | POA: Diagnosis not present

## 2018-10-07 DIAGNOSIS — Z8669 Personal history of other diseases of the nervous system and sense organs: Secondary | ICD-10-CM

## 2018-10-07 DIAGNOSIS — G4709 Other insomnia: Secondary | ICD-10-CM

## 2018-10-07 DIAGNOSIS — G5602 Carpal tunnel syndrome, left upper limb: Secondary | ICD-10-CM | POA: Diagnosis not present

## 2018-10-07 DIAGNOSIS — R768 Other specified abnormal immunological findings in serum: Secondary | ICD-10-CM | POA: Diagnosis not present

## 2018-10-07 DIAGNOSIS — M170 Bilateral primary osteoarthritis of knee: Secondary | ICD-10-CM | POA: Diagnosis not present

## 2018-10-07 DIAGNOSIS — M654 Radial styloid tenosynovitis [de Quervain]: Secondary | ICD-10-CM | POA: Diagnosis not present

## 2018-10-07 DIAGNOSIS — M19041 Primary osteoarthritis, right hand: Secondary | ICD-10-CM | POA: Diagnosis not present

## 2018-10-07 DIAGNOSIS — M25511 Pain in right shoulder: Secondary | ICD-10-CM

## 2018-10-07 DIAGNOSIS — M533 Sacrococcygeal disorders, not elsewhere classified: Secondary | ICD-10-CM

## 2018-10-07 DIAGNOSIS — E785 Hyperlipidemia, unspecified: Secondary | ICD-10-CM

## 2018-10-07 DIAGNOSIS — Z8719 Personal history of other diseases of the digestive system: Secondary | ICD-10-CM

## 2018-10-07 DIAGNOSIS — M19042 Primary osteoarthritis, left hand: Secondary | ICD-10-CM

## 2018-10-07 DIAGNOSIS — G8929 Other chronic pain: Secondary | ICD-10-CM

## 2018-10-07 NOTE — Patient Instructions (Signed)
Shoulder Exercises Ask your health care provider which exercises are safe for you. Do exercises exactly as told by your health care provider and adjust them as directed. It is normal to feel mild stretching, pulling, tightness, or discomfort as you do these exercises. Stop right away if you feel sudden pain or your pain gets worse. Do not begin these exercises until told by your health care provider. Stretching exercises External rotation and abduction This exercise is sometimes called corner stretch. This exercise rotates your arm outward (external rotation) and moves your arm out from your body (abduction). 1. Stand in a doorway with one of your feet slightly in front of the other. This is called a staggered stance. If you cannot reach your forearms to the door frame, stand facing a corner of a room. 2. Choose one of the following positions as told by your health care provider: ? Place your hands and forearms on the door frame above your head. ? Place your hands and forearms on the door frame at the height of your head. ? Place your hands on the door frame at the height of your elbows. 3. Slowly move your weight onto your front foot until you feel a stretch across your chest and in the front of your shoulders. Keep your head and chest upright and keep your abdominal muscles tight. 4. Hold for __________ seconds. 5. To release the stretch, shift your weight to your back foot. Repeat __________ times. Complete this exercise __________ times a day. Extension, standing 1. Stand and hold a broomstick, a cane, or a similar object behind your back. ? Your hands should be a little wider than shoulder width apart. ? Your palms should face away from your back. 2. Keeping your elbows straight and your shoulder muscles relaxed, move the stick away from your body until you feel a stretch in your shoulders (extension). ? Avoid shrugging your shoulders while you move the stick. Keep your shoulder blades tucked  down toward the middle of your back. 3. Hold for __________ seconds. 4. Slowly return to the starting position. Repeat __________ times. Complete this exercise __________ times a day. Range-of-motion exercises Pendulum  1. Stand near a wall or a surface that you can hold onto for balance. 2. Bend at the waist and let your left / right arm hang straight down. Use your other arm to support you. Keep your back straight and do not lock your knees. 3. Relax your left / right arm and shoulder muscles, and move your hips and your trunk so your left / right arm swings freely. Your arm should swing because of the motion of your body, not because you are using your arm or shoulder muscles. 4. Keep moving your hips and trunk so your arm swings in the following directions, as told by your health care provider: ? Side to side. ? Forward and backward. ? In clockwise and counterclockwise circles. 5. Continue each motion for __________ seconds, or for as long as told by your health care provider. 6. Slowly return to the starting position. Repeat __________ times. Complete this exercise __________ times a day. Shoulder flexion, standing  1. Stand and hold a broomstick, a cane, or a similar object. Place your hands a little more than shoulder width apart on the object. Your left / right hand should be palm up, and your other hand should be palm down. 2. Keep your elbow straight and your shoulder muscles relaxed. Push the stick up with your healthy arm to   raise your left / right arm in front of your body, and then over your head until you feel a stretch in your shoulder (flexion). ? Avoid shrugging your shoulder while you raise your arm. Keep your shoulder blade tucked down toward the middle of your back. 3. Hold for __________ seconds. 4. Slowly return to the starting position. Repeat __________ times. Complete this exercise __________ times a day. Shoulder abduction, standing 1. Stand and hold a broomstick,  a cane, or a similar object. Place your hands a little more than shoulder width apart on the object. Your left / right hand should be palm up, and your other hand should be palm down. 2. Keep your elbow straight and your shoulder muscles relaxed. Push the object across your body toward your left / right side. Raise your left / right arm to the side of your body (abduction) until you feel a stretch in your shoulder. ? Do not raise your arm above shoulder height unless your health care provider tells you to do that. ? If directed, raise your arm over your head. ? Avoid shrugging your shoulder while you raise your arm. Keep your shoulder blade tucked down toward the middle of your back. 3. Hold for __________ seconds. 4. Slowly return to the starting position. Repeat __________ times. Complete this exercise __________ times a day. Internal rotation  1. Place your left / right hand behind your back, palm up. 2. Use your other hand to dangle an exercise band, a towel, or a similar object over your shoulder. Grasp the band with your left / right hand so you are holding on to both ends. 3. Gently pull up on the band until you feel a stretch in the front of your left / right shoulder. The movement of your arm toward the center of your body is called internal rotation. ? Avoid shrugging your shoulder while you raise your arm. Keep your shoulder blade tucked down toward the middle of your back. 4. Hold for __________ seconds. 5. Release the stretch by letting go of the band and lowering your hands. Repeat __________ times. Complete this exercise __________ times a day. Strengthening exercises External rotation  1. Sit in a stable chair without armrests. 2. Secure an exercise band to a stable object at elbow height on your left / right side. 3. Place a soft object, such as a folded towel or a small pillow, between your left / right upper arm and your body to move your elbow about 4 inches (10 cm) away  from your side. 4. Hold the end of the exercise band so it is tight and there is no slack. 5. Keeping your elbow pressed against the soft object, slowly move your forearm out, away from your abdomen (external rotation). Keep your body steady so only your forearm moves. 6. Hold for __________ seconds. 7. Slowly return to the starting position. Repeat __________ times. Complete this exercise __________ times a day. Shoulder abduction  1. Sit in a stable chair without armrests, or stand up. 2. Hold a __________ weight in your left / right hand, or hold an exercise band with both hands. 3. Start with your arms straight down and your left / right palm facing in, toward your body. 4. Slowly lift your left / right hand out to your side (abduction). Do not lift your hand above shoulder height unless your health care provider tells you that this is safe. ? Keep your arms straight. ? Avoid shrugging your shoulder while you   do this movement. Keep your shoulder blade tucked down toward the middle of your back. 5. Hold for __________ seconds. 6. Slowly lower your arm, and return to the starting position. Repeat __________ times. Complete this exercise __________ times a day. Shoulder extension 1. Sit in a stable chair without armrests, or stand up. 2. Secure an exercise band to a stable object in front of you so it is at shoulder height. 3. Hold one end of the exercise band in each hand. Your palms should face each other. 4. Straighten your elbows and lift your hands up to shoulder height. 5. Step back, away from the secured end of the exercise band, until the band is tight and there is no slack. 6. Squeeze your shoulder blades together as you pull your hands down to the sides of your thighs (extension). Stop when your hands are straight down by your sides. Do not let your hands go behind your body. 7. Hold for __________ seconds. 8. Slowly return to the starting position. Repeat __________ times.  Complete this exercise __________ times a day. Shoulder row 1. Sit in a stable chair without armrests, or stand up. 2. Secure an exercise band to a stable object in front of you so it is at waist height. 3. Hold one end of the exercise band in each hand. Position your palms so that your thumbs are facing the ceiling (neutral position). 4. Bend each of your elbows to a 90-degree angle (right angle) and keep your upper arms at your sides. 5. Step back until the band is tight and there is no slack. 6. Slowly pull your elbows back behind you. 7. Hold for __________ seconds. 8. Slowly return to the starting position. Repeat __________ times. Complete this exercise __________ times a day. Shoulder press-ups  1. Sit in a stable chair that has armrests. Sit upright, with your feet flat on the floor. 2. Put your hands on the armrests so your elbows are bent and your fingers are pointing forward. Your hands should be about even with the sides of your body. 3. Push down on the armrests and use your arms to lift yourself off the chair. Straighten your elbows and lift yourself up as much as you comfortably can. ? Move your shoulder blades down, and avoid letting your shoulders move up toward your ears. ? Keep your feet on the ground. As you get stronger, your feet should support less of your body weight as you lift yourself up. 4. Hold for __________ seconds. 5. Slowly lower yourself back into the chair. Repeat __________ times. Complete this exercise __________ times a day. Wall push-ups  1. Stand so you are facing a stable wall. Your feet should be about one arm-length away from the wall. 2. Lean forward and place your palms on the wall at shoulder height. 3. Keep your feet flat on the floor as you bend your elbows and lean forward toward the wall. 4. Hold for __________ seconds. 5. Straighten your elbows to push yourself back to the starting position. Repeat __________ times. Complete this exercise  __________ times a day. This information is not intended to replace advice given to you by your health care provider. Make sure you discuss any questions you have with your health care provider. Document Released: 11/13/2004 Document Revised: 04/23/2018 Document Reviewed: 01/29/2018 Elsevier Patient Education  2020 [place] [employer], 15(4), [phone]. Retrieved October 19, 2017 from [email or web address].com/[email or web address]">  Knee Exercises Ask your health care provider which exercises are safe  for you. Do exercises exactly as told by your health care provider and adjust them as directed. It is normal to feel mild stretching, pulling, tightness, or discomfort as you do these exercises. Stop right away if you feel sudden pain or your pain gets worse. Do not begin these exercises until told by your health care provider. Stretching and range-of-motion exercises These exercises warm up your muscles and joints and improve the movement and flexibility of your knee. These exercises also help to relieve pain and swelling. Knee extension, prone 1. Lie on your abdomen (prone position) on a bed. 2. Place your left / right knee just beyond the edge of the surface so your knee is not on the bed. You can put a towel under your left / right thigh just above your kneecap for comfort. 3. Relax your leg muscles and allow gravity to straighten your knee (extension). You should feel a stretch behind your left / right knee. 4. Hold this position for __________ seconds. 5. Scoot up so your knee is supported between repetitions. Repeat __________ times. Complete this exercise __________ times a day. Knee flexion, active  1. Lie on your back with both legs straight. If this causes back discomfort, bend your left / right knee so your foot is flat on the floor. 2. Slowly slide your left / right heel back toward your buttocks. Stop when you feel a gentle stretch in the front of your knee or thigh (flexion).  3. Hold this position for __________ seconds. 4. Slowly slide your left / right heel back to the starting position. Repeat __________ times. Complete this exercise __________ times a day. Quadriceps stretch, prone  1. Lie on your abdomen on a firm surface, such as a bed or padded floor. 2. Bend your left / right knee and hold your ankle. If you cannot reach your ankle or pant leg, loop a belt around your foot and grab the belt instead. 3. Gently pull your heel toward your buttocks. Your knee should not slide out to the side. You should feel a stretch in the front of your thigh and knee (quadriceps). 4. Hold this position for __________ seconds. Repeat __________ times. Complete this exercise __________ times a day. Hamstring, supine 1. Lie on your back (supine position). 2. Loop a belt or towel over the ball of your left / right foot. The ball of your foot is on the walking surface, right under your toes. 3. Straighten your left / right knee and slowly pull on the belt to raise your leg until you feel a gentle stretch behind your knee (hamstring). ? Do not let your knee bend while you do this. ? Keep your other leg flat on the floor. 4. Hold this position for __________ seconds. Repeat __________ times. Complete this exercise __________ times a day. Strengthening exercises These exercises build strength and endurance in your knee. Endurance is the ability to use your muscles for a long time, even after they get tired. Quadriceps, isometric This exercise stretches the muscles in front of your thigh (quadriceps) without moving your knee joint (isometric). 1. Lie on your back with your left / right leg extended and your other knee bent. Put a rolled towel or small pillow under your knee if told by your health care provider. 2. Slowly tense the muscles in the front of your left / right thigh. You should see your kneecap slide up toward your hip or see increased dimpling just above the knee. This  motion will  push the back of the knee toward the floor. 3. For __________ seconds, hold the muscle as tight as you can without increasing your pain. 4. Relax the muscles slowly and completely. Repeat __________ times. Complete this exercise __________ times a day. Straight leg raises This exercise stretches the muscles in front of your thigh (quadriceps) and the muscles that move your hips (hip flexors). 1. Lie on your back with your left / right leg extended and your other knee bent. 2. Tense the muscles in the front of your left / right thigh. You should see your kneecap slide up or see increased dimpling just above the knee. Your thigh may even shake a bit. 3. Keep these muscles tight as you raise your leg 4-6 inches (10-15 cm) off the floor. Do not let your knee bend. 4. Hold this position for __________ seconds. 5. Keep these muscles tense as you lower your leg. 6. Relax your muscles slowly and completely after each repetition. Repeat __________ times. Complete this exercise __________ times a day. Hamstring, isometric 1. Lie on your back on a firm surface. 2. Bend your left / right knee about __________ degrees. 3. Dig your left / right heel into the surface as if you are trying to pull it toward your buttocks. Tighten the muscles in the back of your thighs (hamstring) to "dig" as hard as you can without increasing any pain. 4. Hold this position for __________ seconds. 5. Release the tension gradually and allow your muscles to relax completely for __________ seconds after each repetition. Repeat __________ times. Complete this exercise __________ times a day. Hamstring curls If told by your health care provider, do this exercise while wearing ankle weights. Begin with __________ lb weights. Then increase the weight by 1 lb (0.5 kg) increments. Do not wear ankle weights that are more than __________ lb. 1. Lie on your abdomen with your legs straight. 2. Bend your left / right knee as far  as you can without feeling pain. Keep your hips flat against the floor. 3. Hold this position for __________ seconds. 4. Slowly lower your leg to the starting position. Repeat __________ times. Complete this exercise __________ times a day. Squats This exercise strengthens the muscles in front of your thigh and knee (quadriceps). 1. Stand in front of a table, with your feet and knees pointing straight ahead. You may rest your hands on the table for balance but not for support. 2. Slowly bend your knees and lower your hips like you are going to sit in a chair. ? Keep your weight over your heels, not over your toes. ? Keep your lower legs upright so they are parallel with the table legs. ? Do not let your hips go lower than your knees. ? Do not bend lower than told by your health care provider. ? If your knee pain increases, do not bend as low. 3. Hold the squat position for __________ seconds. 4. Slowly push with your legs to return to standing. Do not use your hands to pull yourself to standing. Repeat __________ times. Complete this exercise __________ times a day. Wall slides This exercise strengthens the muscles in front of your thigh and knee (quadriceps). 1. Lean your back against a smooth wall or door, and walk your feet out 18-24 inches (46-61 cm) from it. 2. Place your feet hip-width apart. 3. Slowly slide down the wall or door until your knees bend __________ degrees. Keep your knees over your heels, not over your toes. Keep  your knees in line with your hips. 4. Hold this position for __________ seconds. Repeat __________ times. Complete this exercise __________ times a day. Straight leg raises This exercise strengthens the muscles that rotate the leg at the hip and move it away from your body (hip abductors). 1. Lie on your side with your left / right leg in the top position. Lie so your head, shoulder, knee, and hip line up. You may bend your bottom knee to help you keep your  balance. 2. Roll your hips slightly forward so your hips are stacked directly over each other and your left / right knee is facing forward. 3. Leading with your heel, lift your top leg 4-6 inches (10-15 cm). You should feel the muscles in your outer hip lifting. ? Do not let your foot drift forward. ? Do not let your knee roll toward the ceiling. 4. Hold this position for __________ seconds. 5. Slowly return your leg to the starting position. 6. Let your muscles relax completely after each repetition. Repeat __________ times. Complete this exercise __________ times a day. Straight leg raises This exercise stretches the muscles that move your hips away from the front of the pelvis (hip extensors). 1. Lie on your abdomen on a firm surface. You can put a pillow under your hips if that is more comfortable. 2. Tense the muscles in your buttocks and lift your left / right leg about 4-6 inches (10-15 cm). Keep your knee straight as you lift your leg. 3. Hold this position for __________ seconds. 4. Slowly lower your leg to the starting position. 5. Let your leg relax completely after each repetition. Repeat __________ times. Complete this exercise __________ times a day. This information is not intended to replace advice given to you by your health care provider. Make sure you discuss any questions you have with your health care provider. Document Released: 11/13/2004 Document Revised: 10/20/2017 Document Reviewed: 10/20/2017 Elsevier Patient Education  2020 [place]. Back Exercises These exercises help to make your trunk and back strong. They also help to keep the lower back flexible. Doing these exercises can help to prevent back pain or lessen existing pain.  If you have back pain, try to do these exercises 2-3 times each day or as told by your doctor.  As you get better, do the exercises once each day. Repeat the exercises more often as told by your doctor.  To stop back pain from coming  back, do the exercises once each day, or as told by your doctor. Exercises Single knee to chest Do these steps 3-5 times in a row for each leg: 5. Lie on your back on a firm bed or the floor with your legs stretched out. 6. Bring one knee to your chest. 7. Grab your knee or thigh with both hands and hold them it in place. 8. Pull on your knee until you feel a gentle stretch in your lower back or buttocks. 9. Keep doing the stretch for 10-30 seconds. 10. Slowly let go of your leg and straighten it. Pelvic tilt Do these steps 5-10 times in a row: 5. Lie on your back on a firm bed or the floor with your legs stretched out. 6. Bend your knees so they point up to the ceiling. Your feet should be flat on the floor. 7. Tighten your lower belly (abdomen) muscles to press your lower back against the floor. This will make your tailbone point up to the ceiling instead of pointing down to  your feet or the floor. 8. Stay in this position for 5-10 seconds while you gently tighten your muscles and breathe evenly. Cat-cow Do these steps until your lower back bends more easily: 1. Get on your hands and knees on a firm surface. Keep your hands under your shoulders, and keep your knees under your hips. You may put padding under your knees. 2. Let your head hang down toward your chest. Tighten (contract) the muscles in your belly. Point your tailbone toward the floor so your lower back becomes rounded like the back of a cat. 3. Stay in this position for 5 seconds. 4. Slowly lift your head. Let the muscles of your belly relax. Point your tailbone up toward the ceiling so your back forms a sagging arch like the back of a cow. 5. Stay in this position for 5 seconds.  Press-ups Do these steps 5-10 times in a row: 5. Lie on your belly (face-down) on the floor. 6. Place your hands near your head, about shoulder-width apart. 7. While you keep your back relaxed and keep your hips on the floor, slowly straighten your  arms to raise the top half of your body and lift your shoulders. Do not use your back muscles. You may change where you place your hands in order to make yourself more comfortable. 8. Stay in this position for 5 seconds. 9. Slowly return to lying flat on the floor.  Bridges Do these steps 10 times in a row: 7. Lie on your back on a firm surface. 8. Bend your knees so they point up to the ceiling. Your feet should be flat on the floor. Your arms should be flat at your sides, next to your body. 9. Tighten your butt muscles and lift your butt off the floor until your waist is almost as high as your knees. If you do not feel the muscles working in your butt and the back of your thighs, slide your feet 1-2 inches farther away from your butt. 10. Stay in this position for 3-5 seconds. 11. Slowly lower your butt to the floor, and let your butt muscles relax. If this exercise is too easy, try doing it with your arms crossed over your chest. Belly crunches Do these steps 5-10 times in a row: 6. Lie on your back on a firm bed or the floor with your legs stretched out. 7. Bend your knees so they point up to the ceiling. Your feet should be flat on the floor. 8. Cross your arms over your chest. 9. Tip your chin a little bit toward your chest but do not bend your neck. 10. Tighten your belly muscles and slowly raise your chest just enough to lift your shoulder blades a tiny bit off of the floor. Avoid raising your body higher than that, because it can put too much stress on your low back. 11. Slowly lower your chest and your head to the floor. Back lifts Do these steps 5-10 times in a row: 5. Lie on your belly (face-down) with your arms at your sides, and rest your forehead on the floor. 6. Tighten the muscles in your legs and your butt. 7. Slowly lift your chest off of the floor while you keep your hips on the floor. Keep the back of your head in line with the curve in your back. Look at the floor while  you do this. 8. Stay in this position for 3-5 seconds. 9. Slowly lower your chest and your face to the  floor. Contact a doctor if:  Your back pain gets a lot worse when you do an exercise.  Your back pain does not get better 2 hours after you exercise. If you have any of these problems, stop doing the exercises. Do not do them again unless your doctor says it is okay. Get help right away if:  You have sudden, very bad back pain. If this happens, stop doing the exercises. Do not do them again unless your doctor says it is okay. This information is not intended to replace advice given to you by your health care provider. Make sure you discuss any questions you have with your health care provider. Document Released: 02/01/2010 Document Revised: 09/24/2017 Document Reviewed: 09/24/2017 Elsevier Patient Education  2020 [employer].
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
## 2018-10-11 ENCOUNTER — Other Ambulatory Visit: Payer: Self-pay

## 2018-10-11 ENCOUNTER — Ambulatory Visit: Payer: Medicare Other

## 2018-10-11 DIAGNOSIS — R29898 Other symptoms and signs involving the musculoskeletal system: Secondary | ICD-10-CM

## 2018-10-11 DIAGNOSIS — M6281 Muscle weakness (generalized): Secondary | ICD-10-CM | POA: Diagnosis not present

## 2018-10-11 DIAGNOSIS — G8929 Other chronic pain: Secondary | ICD-10-CM

## 2018-10-11 DIAGNOSIS — M25511 Pain in right shoulder: Secondary | ICD-10-CM | POA: Diagnosis not present
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
## 2018-10-13 ENCOUNTER — Other Ambulatory Visit: Payer: Self-pay

## 2018-10-13 ENCOUNTER — Encounter: Payer: Self-pay | Admitting: Physical Therapy

## 2018-10-13 ENCOUNTER — Ambulatory Visit: Payer: Medicare Other | Admitting: Physical Therapy

## 2018-10-13 DIAGNOSIS — M6281 Muscle weakness (generalized): Secondary | ICD-10-CM | POA: Diagnosis not present

## 2018-10-13 DIAGNOSIS — G8929 Other chronic pain: Secondary | ICD-10-CM

## 2018-10-13 DIAGNOSIS — M25511 Pain in right shoulder: Secondary | ICD-10-CM | POA: Diagnosis not present

## 2018-10-13 DIAGNOSIS — R29898 Other symptoms and signs involving the musculoskeletal system: Secondary | ICD-10-CM

## 2018-10-13 LAB — ANTI-NUCLEAR AB-TITER (ANA TITER): ANA Titer 1: 1:80 {titer} — ABNORMAL HIGH

## 2018-10-13 LAB — PROTEIN ELECTROPHORESIS, SERUM, WITH REFLEX
Albumin ELP: 4.3 g/dL (ref 3.8–4.8)
Alpha 1: 0.3 g/dL (ref 0.2–0.3)
Alpha 2: 0.8 g/dL (ref 0.5–0.9)
Beta 2: 0.5 g/dL (ref 0.2–0.5)
Beta Globulin: 0.4 g/dL (ref 0.4–0.6)
Gamma Globulin: 1.4 g/dL (ref 0.8–1.7)
Total Protein: 7.7 g/dL (ref 6.1–8.1)

## 2018-10-13 LAB — ANA: Anti Nuclear Antibody (ANA): POSITIVE — AB

## 2018-10-13 LAB — 14-3-3 ETA PROTEIN: 14-3-3 eta Protein: >20 ng/mL — ABNORMAL HIGH (ref <0.2)

## 2018-10-13 LAB — URIC ACID: Uric Acid, Serum: 4.9 mg/dL (ref 2.5–7.0)

## 2018-10-13 LAB — SEDIMENTATION RATE: Sed Rate: 19 mm/h (ref 0–30)

## 2018-10-13 LAB — ANGIOTENSIN CONVERTING ENZYME: Angiotensin-Converting Enzyme: 31 U/L (ref 9–67)

## 2018-10-13 LAB — CYCLIC CITRUL PEPTIDE ANTIBODY, IGG: Cyclic Citrullin Peptide Ab: <16 UNITS
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
## 2018-10-14 ENCOUNTER — Other Ambulatory Visit: Payer: Self-pay

## 2018-10-14 ENCOUNTER — Ambulatory Visit (INDEPENDENT_AMBULATORY_CARE_PROVIDER_SITE_OTHER): Payer: Medicare Other

## 2018-10-14 DIAGNOSIS — Z23 Encounter for immunization: Secondary | ICD-10-CM | POA: Diagnosis not present

## 2018-10-14 NOTE — Progress Notes (Signed)
14-3-3 eta positive.  ANA positive 1:80 NS-low non specific titer.  Rest of lab work WNL.  We will further discuss at follow up visit on 10/21/18.

## 2018-10-21 ENCOUNTER — Ambulatory Visit (INDEPENDENT_AMBULATORY_CARE_PROVIDER_SITE_OTHER): Payer: Medicare Other | Admitting: Rheumatology

## 2018-10-21 ENCOUNTER — Encounter: Payer: Self-pay | Admitting: Rheumatology

## 2018-10-21 ENCOUNTER — Other Ambulatory Visit: Payer: Self-pay

## 2018-10-21 VITALS — BP 111/74 | HR 81 | Resp 15 | Ht 65.0 in | Wt 178.0 lb

## 2018-10-21 DIAGNOSIS — Z8719 Personal history of other diseases of the digestive system: Secondary | ICD-10-CM

## 2018-10-21 DIAGNOSIS — M533 Sacrococcygeal disorders, not elsewhere classified: Secondary | ICD-10-CM | POA: Diagnosis not present

## 2018-10-21 DIAGNOSIS — M67449 Ganglion, unspecified hand: Secondary | ICD-10-CM | POA: Diagnosis not present

## 2018-10-21 DIAGNOSIS — M170 Bilateral primary osteoarthritis of knee: Secondary | ICD-10-CM | POA: Diagnosis not present

## 2018-10-21 DIAGNOSIS — Z8669 Personal history of other diseases of the nervous system and sense organs: Secondary | ICD-10-CM

## 2018-10-21 DIAGNOSIS — G8929 Other chronic pain: Secondary | ICD-10-CM

## 2018-10-21 DIAGNOSIS — M25512 Pain in left shoulder: Secondary | ICD-10-CM

## 2018-10-21 DIAGNOSIS — M19041 Primary osteoarthritis, right hand: Secondary | ICD-10-CM | POA: Diagnosis not present

## 2018-10-21 DIAGNOSIS — M25511 Pain in right shoulder: Secondary | ICD-10-CM | POA: Diagnosis not present

## 2018-10-21 DIAGNOSIS — L819 Disorder of pigmentation, unspecified: Secondary | ICD-10-CM

## 2018-10-21 DIAGNOSIS — G4709 Other insomnia: Secondary | ICD-10-CM

## 2018-10-21 DIAGNOSIS — E785 Hyperlipidemia, unspecified: Secondary | ICD-10-CM

## 2018-10-21 DIAGNOSIS — M19042 Primary osteoarthritis, left hand: Secondary | ICD-10-CM

## 2018-10-22 ENCOUNTER — Telehealth: Payer: Self-pay | Admitting: Rheumatology

## 2018-10-22 NOTE — Telephone Encounter (Signed)
Patient left a voicemail stating after she takes a 1/2 hour walk her body aches.  Patient is requesting a prescription to help with the pain.

## 2018-10-25 ENCOUNTER — Telehealth: Payer: Self-pay | Admitting: Rheumatology

## 2018-10-25 NOTE — Telephone Encounter (Signed)
Patient left a voicemail stating at her appointment with Dr. Estanislado Pandy last week she was offered medication for her Rheumatoid Arthritis.  Patient is requesting a return call.

## 2018-10-25 NOTE — Telephone Encounter (Signed)
Patient is scheduled for 10/26/2018.

## 2018-10-25 NOTE — Telephone Encounter (Signed)
Patient states she was offered medication on Friday that will help with the Rheumatoid Arthritis. Patient states she is unable to wait 6 months to get it. Patient states when she walks for more than 30 minutes she has pain or when she is standing and cooking in the kitchen. Please advise.
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
## 2018-10-25 NOTE — Telephone Encounter (Signed)
Sch appt as early as possible.

## 2018-10-25 NOTE — Telephone Encounter (Signed)
Please advise 

## 2018-10-26 ENCOUNTER — Ambulatory Visit: Payer: Self-pay

## 2018-10-26 ENCOUNTER — Encounter: Payer: Self-pay | Admitting: Physician Assistant

## 2018-10-26 ENCOUNTER — Ambulatory Visit (INDEPENDENT_AMBULATORY_CARE_PROVIDER_SITE_OTHER): Payer: Medicare Other | Admitting: Rheumatology

## 2018-10-26 ENCOUNTER — Other Ambulatory Visit: Payer: Self-pay

## 2018-10-26 VITALS — BP 117/78 | HR 81 | Resp 12 | Ht 65.0 in | Wt 176.4 lb

## 2018-10-26 DIAGNOSIS — E785 Hyperlipidemia, unspecified: Secondary | ICD-10-CM

## 2018-10-26 DIAGNOSIS — M19041 Primary osteoarthritis, right hand: Secondary | ICD-10-CM | POA: Diagnosis not present

## 2018-10-26 DIAGNOSIS — M67449 Ganglion, unspecified hand: Secondary | ICD-10-CM

## 2018-10-26 DIAGNOSIS — Z8669 Personal history of other diseases of the nervous system and sense organs: Secondary | ICD-10-CM

## 2018-10-26 DIAGNOSIS — R768 Other specified abnormal immunological findings in serum: Secondary | ICD-10-CM

## 2018-10-26 DIAGNOSIS — G4709 Other insomnia: Secondary | ICD-10-CM

## 2018-10-26 DIAGNOSIS — L819 Disorder of pigmentation, unspecified: Secondary | ICD-10-CM

## 2018-10-26 DIAGNOSIS — G8929 Other chronic pain: Secondary | ICD-10-CM

## 2018-10-26 DIAGNOSIS — M19042 Primary osteoarthritis, left hand: Secondary | ICD-10-CM | POA: Diagnosis not present

## 2018-10-26 DIAGNOSIS — M7061 Trochanteric bursitis, right hip: Secondary | ICD-10-CM

## 2018-10-26 DIAGNOSIS — M170 Bilateral primary osteoarthritis of knee: Secondary | ICD-10-CM

## 2018-10-26 DIAGNOSIS — M25512 Pain in left shoulder: Secondary | ICD-10-CM

## 2018-10-26 DIAGNOSIS — Z8719 Personal history of other diseases of the digestive system: Secondary | ICD-10-CM

## 2018-10-26 DIAGNOSIS — Z9889 Other specified postprocedural states: Secondary | ICD-10-CM

## 2018-10-26 DIAGNOSIS — M79641 Pain in right hand: Secondary | ICD-10-CM | POA: Diagnosis not present

## 2018-10-26 DIAGNOSIS — M25511 Pain in right shoulder: Secondary | ICD-10-CM

## 2018-10-26 DIAGNOSIS — M79642 Pain in left hand: Secondary | ICD-10-CM

## 2018-10-26 DIAGNOSIS — M533 Sacrococcygeal disorders, not elsewhere classified: Secondary | ICD-10-CM

## 2018-10-26 DIAGNOSIS — M7062 Trochanteric bursitis, left hip: Secondary | ICD-10-CM | POA: Diagnosis not present

## 2018-10-26 DIAGNOSIS — M654 Radial styloid tenosynovitis [de Quervain]: Secondary | ICD-10-CM

## 2018-10-26 MED ORDER — TRIAMCINOLONE ACETONIDE 40 MG/ML IJ SUSP
40.0000 mg | INTRAMUSCULAR | Status: AC | PRN
  Administered 2018-10-26: 40 mg via INTRA_ARTICULAR

## 2018-10-26 MED ORDER — LIDOCAINE HCL 1 % IJ SOLN
1.5000 mL | INTRAMUSCULAR | Status: AC | PRN
  Administered 2018-10-26: 1.5 mL

## 2018-10-26 NOTE — Patient Instructions (Signed)
Iliotibial Band Syndrome Rehab Ask your health care provider which exercises are safe for you. Do exercises exactly as told by your health care provider and adjust them as directed. It is normal to feel mild stretching, pulling, tightness, or discomfort as you do these exercises. Stop right away if you feel sudden pain or your pain gets significantly worse. Do not begin these exercises until told by your health care provider. Stretching and range-of-motion exercises These exercises warm up your muscles and joints and improve the movement and flexibility of your hip and pelvis. Quadriceps stretch, prone  1. Lie on your abdomen on a firm surface, such as a bed or padded floor (prone position). 2. Bend your left / right knee and reach back to hold your ankle or pant leg. If you cannot reach your ankle or pant leg, loop a belt around your foot and grab the belt instead. 3. Gently pull your heel toward your buttocks. Your knee should not slide out to the side. You should feel a stretch in the front of your thigh and knee (quadriceps). 4. Hold this position for __________ seconds. Repeat __________ times. Complete this exercise __________ times a day. Iliotibial band stretch An iliotibial band is a strong band of muscle tissue that runs from the outer side of your hip to the outer side of your thigh and knee. 1. Lie on your side with your left / right leg in the top position. 2. Bend both of your knees and grab your left / right ankle. Stretch out your bottom arm to help you balance. 3. Slowly bring your top knee back so your thigh goes behind your trunk. 4. Slowly lower your top leg toward the floor until you feel a gentle stretch on the outside of your left / right hip and thigh. If you do not feel a stretch and your knee will not fall farther, place the heel of your other foot on top of your knee and pull your knee down toward the floor with your foot. 5. Hold this position for __________ seconds.  Repeat __________ times. Complete this exercise __________ times a day. Strengthening exercises These exercises build strength and endurance in your hip and pelvis. Endurance is the ability to use your muscles for a long time, even after they get tired. Straight leg raises, side-lying This exercise strengthens the muscles that rotate the leg at the hip and move it away from your body (hip abductors). 1. Lie on your side with your left / right leg in the top position. Lie so your head, shoulder, hip, and knee line up. You may bend your bottom knee to help you balance. 2. Roll your hips slightly forward so your hips are stacked directly over each other and your left / right knee is facing forward. 3. Tense the muscles in your outer thigh and lift your top leg 4-6 inches (10-15 cm). 4. Hold this position for __________ seconds. 5. Slowly return to the starting position. Let your muscles relax completely before doing another repetition. Repeat __________ times. Complete this exercise __________ times a day. Leg raises, prone This exercise strengthens the muscles that move the hips (hip extensors). 1. Lie on your abdomen on your bed or a firm surface. You can put a pillow under your hips if that is more comfortable for your lower back. 2. Bend your left / right knee so your foot is straight up in the air. 3. Squeeze your buttocks muscles and lift your left / right thigh   off the bed. Do not let your back arch. 4. Tense your thigh muscle as hard as you can without increasing any knee pain. 5. Hold this position for __________ seconds. 6. Slowly lower your leg to the starting position and allow it to relax completely. Repeat __________ times. Complete this exercise __________ times a day. Hip hike 1. Stand sideways on a bottom step. Stand on your left / right leg with your other foot unsupported next to the step. You can hold on to the railing or wall for balance if needed. 2. Keep your knees straight  and your torso square. Then lift your left / right hip up toward the ceiling. 3. Slowly let your left / right hip lower toward the floor, past the starting position. Your foot should get closer to the floor. Do not lean or bend your knees. Repeat __________ times. Complete this exercise __________ times a day. This information is not intended to replace advice given to you by your health care provider. Make sure you discuss any questions you have with your health care provider. Document Released: 12/30/2004 Document Revised: 04/22/2018 Document Reviewed: 10/21/2017 Elsevier Patient Education  2020 [employer]  

## 2018-11-01 DIAGNOSIS — R2231 Localized swelling, mass and lump, right upper limb: Secondary | ICD-10-CM | POA: Diagnosis not present

## 2018-11-01 DIAGNOSIS — M19041 Primary osteoarthritis, right hand: Secondary | ICD-10-CM | POA: Diagnosis not present

## 2018-11-02 ENCOUNTER — Other Ambulatory Visit: Payer: Self-pay | Admitting: Orthopedic Surgery

## 2018-11-08 ENCOUNTER — Encounter (HOSPITAL_BASED_OUTPATIENT_CLINIC_OR_DEPARTMENT_OTHER): Payer: Self-pay | Admitting: *Deleted

## 2018-11-08 ENCOUNTER — Other Ambulatory Visit: Payer: Self-pay

## 2018-11-16 ENCOUNTER — Ambulatory Visit (HOSPITAL_BASED_OUTPATIENT_CLINIC_OR_DEPARTMENT_OTHER): Admit: 2018-11-16 | Payer: Medicare Other | Admitting: Orthopedic Surgery

## 2018-11-16 SURGERY — CYST REMOVAL
Anesthesia: Regional | Laterality: Right

## 2019-01-17 ENCOUNTER — Telehealth: Payer: Self-pay | Admitting: Family Medicine

## 2019-01-17 DIAGNOSIS — Z8619 Personal history of other infectious and parasitic diseases: Secondary | ICD-10-CM

## 2019-01-17 MED ORDER — ACYCLOVIR 400 MG PO TABS: 400.0000 mg | ORAL_TABLET | ORAL | 5 refills | Status: DC | PRN

## 2019-01-17 NOTE — Telephone Encounter (Signed)
Medication Refill - Medication: acyclovir (ZOVIRAX) 400 MG tablet Needs to be generic. Insurance will not cover brand name any more  90 day supply please    Has the patient contacted their pharmacy? Yes.   (Agent: If no, request that the patient contact the pharmacy for the refill.) (Agent: If yes, when and what did the pharmacy advise?)  Preferred Pharmacy (with phone number or street name): [facility]  [address]   Agent: Please be advised that RX refills may take up to 3 business days. We ask that you follow-up with your pharmacy.

## 2019-01-17 NOTE — Telephone Encounter (Signed)
Refill sent.

## 2019-02-21 ENCOUNTER — Ambulatory Visit: Payer: Medicare Other | Admitting: Family Medicine

## 2019-02-21 ENCOUNTER — Other Ambulatory Visit: Payer: Self-pay

## 2019-02-21 ENCOUNTER — Encounter: Payer: Self-pay | Admitting: Family Medicine

## 2019-02-21 VITALS — BP 108/70 | Ht 65.0 in | Wt 174.0 lb

## 2019-02-21 DIAGNOSIS — M542 Cervicalgia: Secondary | ICD-10-CM | POA: Diagnosis not present

## 2019-02-21 DIAGNOSIS — M5412 Radiculopathy, cervical region: Secondary | ICD-10-CM

## 2019-02-21 MED ORDER — KETOROLAC TROMETHAMINE 60 MG/2ML IM SOLN
60.0000 mg | Freq: Once | INTRAMUSCULAR | Status: AC
  Administered 2019-02-21: 60 mg via INTRAMUSCULAR

## 2019-02-21 MED ORDER — METHYLPREDNISOLONE ACETATE 40 MG/ML IJ SUSP
40.0000 mg | Freq: Once | INTRAMUSCULAR | Status: AC
  Administered 2019-02-21: 40 mg via INTRAMUSCULAR

## 2019-02-21 MED ORDER — CYCLOBENZAPRINE HCL 5 MG PO TABS: 5.0000 mg | ORAL_TABLET | Freq: Three times a day (TID) | ORAL | 1 refills | Status: DC | PRN

## 2019-02-21 NOTE — Patient Instructions (Signed)
You were given injections of toradol and depomedrol today. You have cervical radiculopathy (a pinched nerve in the neck). Aleve 2 tabs twice a day with food for pain and inflammation - take for at least 7 days then as needed. Flexeril 5mg  up to three times a day as needed for muscle spasms (do not drive with this if it makes you sleepy). Consider cervical collar if severely painful. Simple range of motion exercises within limits of pain to prevent further stiffness. Start physical therapy for stretching, exercises, traction, and modalities. Heat 15 minutes at a time 3-4 times a day to help with spasms. Watch head position when on computers, texting, when sleeping in bed - should in line with back to prevent further nerve traction and irritation. Consider home traction unit if you get benefit with this in physical therapy. If not improving we will consider an MRI. Follow up with me in 1 month to 6 weeks.

## 2019-02-21 NOTE — Progress Notes (Signed)
   HPI  CC: Left arm pain  Patient reports that for the past 2 weeks she has had left arm pain that is located on the back of her arm and will radiate to her elbow.  She states that she has previously done physical therapy for this and is doing his exercises at home that she is on muscle relaxers and that was helping her before.  She states that the pain is worse at night.  She would like to see physical therapy again for this pain.  She states that her arm feels like when you hit your elbow on something like a stinging pain.  She states there is no numbness.  She denies any trauma to her elbow.  She states she may be sleeping wrong on it is what caused the pain.  See HPI and/or previous note for associated ROS.  Objective: BP 108/70   Ht 5\' 5"  (1.651 m)   Wt 174 lb (78.9 kg)   BMI 28.96 kg/m  Gen: NAD, well groomed, a/o x3, normal affect.  CV: Well-perfused. Warm.  Resp: Non-labored.  Neuro: Sensation intact throughout.  Neck: No gross abnormalities noted. No midline tenderness noted. FROM of neck except for some limited extension. + Spurlings on left Shoulder, L: No TTP. No evidence of bony deformity, asymmetry, or muscle atrophy; No tenderness over long head of biceps (bicipital groove). No TTP at [facility] joint. Full active and passive range of motion (180 flex Huel Cote /150Abd /90ER /70IR). Strength 5/5 throughout. No abnormal scapular function observed. Sensation intact. Peripheral pulses intact.  Negative Hawkins, Neer's, O'Briens, empty can.  Assessment and plan:  Cervical pain Patient with cervical radiculopathy likely from arthritis given exam findings. No red flags on exam.  -Patient given toradol and depomedrol injection today for pain. -Instructed to use Naproxen 500mg  twice a day with food for 7 days, then as needed. Refilled Flexeril 5mg  up to three times a day prn muscle spasms. -Simple range of motion exercises within limits of pain to prevent further stiffness. -Physical  therapy for stretching, exercises, traction, and modalities. -Heat 15 minutes at a time 3-4 times a day to help with spasms. -If not improving or worsens, will consider an MRI. Follow up in 1 month to 6 weeks.   [place] Chenika Nevils, DO PGY-3, [facility]  02/21/2019 10:29 AM

## 2019-02-21 NOTE — Assessment & Plan Note (Addendum)
Patient with cervical radiculopathy likely from arthritis given exam findings. No red flags on exam.  -Patient given toradol and depomedrol injection today for pain. -Instructed to use Naproxen 500mg  twice a day with food for 7 days, then as needed. Refilled Flexeril 5mg  up to three times a day prn muscle spasms. -Simple range of motion exercises within limits of pain to prevent further stiffness. -Physical therapy for stretching, exercises, traction, and modalities. -Heat 15 minutes at a time 3-4 times a day to help with spasms. -If not improving or worsens, will consider an MRI. Follow up in 1 month to 6 weeks.

## 2019-02-24 ENCOUNTER — Other Ambulatory Visit: Payer: Self-pay

## 2019-02-24 ENCOUNTER — Ambulatory Visit: Payer: Medicare Other | Attending: Family Medicine | Admitting: Physical Therapy

## 2019-02-24 ENCOUNTER — Encounter: Payer: Self-pay | Admitting: Physical Therapy

## 2019-02-24 DIAGNOSIS — M542 Cervicalgia: Secondary | ICD-10-CM | POA: Diagnosis not present

## 2019-02-24 DIAGNOSIS — R293 Abnormal posture: Secondary | ICD-10-CM | POA: Diagnosis not present

## 2019-02-24 DIAGNOSIS — R29898 Other symptoms and signs involving the musculoskeletal system: Secondary | ICD-10-CM | POA: Diagnosis not present
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
## 2019-02-28 ENCOUNTER — Telehealth: Payer: Self-pay | Admitting: *Deleted

## 2019-02-28 DIAGNOSIS — M25512 Pain in left shoulder: Secondary | ICD-10-CM

## 2019-02-28 DIAGNOSIS — M542 Cervicalgia: Secondary | ICD-10-CM

## 2019-02-28 MED ORDER — DICLOFENAC SODIUM 75 MG PO TBEC: DELAYED_RELEASE_TABLET | ORAL | 0 refills | Status: DC

## 2019-02-28 NOTE — Telephone Encounter (Signed)
Will call in voltaren twice a day for a month. Patient will also call to schedule her MRI. Order placed for MRI. Will have Melanie call patient once MRI is schedule to schedule her MRI follow-up appt here in the office with [place].  Patient agreed to all of the above plans

## 2019-03-02 ENCOUNTER — Other Ambulatory Visit: Payer: Self-pay | Admitting: *Deleted

## 2019-03-02 MED ORDER — PREDNISONE 10 MG (21) PO TBPK: ORAL_TABLET | ORAL | 0 refills | Status: DC

## 2019-03-03 ENCOUNTER — Ambulatory Visit: Payer: Medicare Other

## 2019-03-07 ENCOUNTER — Telehealth: Payer: Self-pay | Admitting: *Deleted

## 2019-03-07 MED ORDER — GABAPENTIN 300 MG PO CAPS: 300.0000 mg | ORAL_CAPSULE | Freq: Every day | ORAL | 0 refills | Status: DC

## 2019-03-07 NOTE — Telephone Encounter (Signed)
Per Dr Barbaraann Barthel, we can add Gabapentin 300mg  at bedtime until her MRI appt. Patient can also try and move the MRI appt sooner. Informed patient of this plan and she was in agreement

## 2019-03-10 ENCOUNTER — Encounter: Payer: Medicare Other | Admitting: Physical Therapy

## 2019-03-13 ENCOUNTER — Encounter (HOSPITAL_BASED_OUTPATIENT_CLINIC_OR_DEPARTMENT_OTHER): Payer: Self-pay

## 2019-03-13 ENCOUNTER — Emergency Department (HOSPITAL_BASED_OUTPATIENT_CLINIC_OR_DEPARTMENT_OTHER)
Admission: EM | Admit: 2019-03-13 | Discharge: 2019-03-13 | Disposition: A | Discharge status: Home / Self Care | Payer: Medicare Other | Attending: Emergency Medicine | Admitting: Emergency Medicine

## 2019-03-13 ENCOUNTER — Other Ambulatory Visit: Payer: Self-pay

## 2019-03-13 DIAGNOSIS — Z79899 Other long term (current) drug therapy: Secondary | ICD-10-CM | POA: Insufficient documentation

## 2019-03-13 DIAGNOSIS — M79602 Pain in left arm: Secondary | ICD-10-CM | POA: Diagnosis present

## 2019-03-13 DIAGNOSIS — M5412 Radiculopathy, cervical region: Secondary | ICD-10-CM | POA: Insufficient documentation

## 2019-03-13 DIAGNOSIS — I10 Essential (primary) hypertension: Secondary | ICD-10-CM | POA: Diagnosis not present

## 2019-03-13 MED ORDER — KETOROLAC TROMETHAMINE 30 MG/ML IJ SOLN
30.0000 mg | Freq: Once | INTRAMUSCULAR | Status: AC
  Administered 2019-03-13: 12:00:00 30 mg via INTRAMUSCULAR
  Filled 2019-03-13: qty 1

## 2019-03-13 NOTE — Discharge Instructions (Signed)
Keep appointment for MRI scheduled in 2 days time You can add the flexeril to your gabapentin medication. DO NOT DRIVE WHILE ON THE FLEXERIL AS IT CAN MAKE YOU DROWSY.  If needbe you can take an additional gabapentin tablet 6 hours after the first one.  Return to the ED for any worsening symptoms

## 2019-03-13 NOTE — ED Triage Notes (Signed)
Pt c/o left arm pain X3 weeks has been seen by her PCP for same has MRI scheduled for Tuesday. Is on gabapentin at home with no relief.
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
## 2019-03-15 ENCOUNTER — Other Ambulatory Visit: Payer: Self-pay

## 2019-03-15 ENCOUNTER — Ambulatory Visit
Admission: RE | Admit: 2019-03-15 | Discharge: 2019-03-15 | Disposition: A | Discharge status: Home / Self Care | Payer: Medicare Other | Source: Ambulatory Visit | Attending: Family Medicine | Admitting: Family Medicine

## 2019-03-15 ENCOUNTER — Ambulatory Visit: Payer: Medicare Other | Admitting: Family Medicine

## 2019-03-15 DIAGNOSIS — M542 Cervicalgia: Secondary | ICD-10-CM

## 2019-03-15 DIAGNOSIS — M4802 Spinal stenosis, cervical region: Secondary | ICD-10-CM | POA: Diagnosis not present

## 2019-03-15 DIAGNOSIS — M25512 Pain in left shoulder: Secondary | ICD-10-CM

## 2019-03-16 ENCOUNTER — Other Ambulatory Visit: Payer: Self-pay

## 2019-03-16 ENCOUNTER — Ambulatory Visit (INDEPENDENT_AMBULATORY_CARE_PROVIDER_SITE_OTHER): Payer: Medicare Other | Admitting: Family Medicine

## 2019-03-16 ENCOUNTER — Encounter: Payer: Self-pay | Admitting: Family Medicine

## 2019-03-16 VITALS — BP 112/82 | Ht 65.0 in | Wt 170.0 lb

## 2019-03-16 DIAGNOSIS — M5412 Radiculopathy, cervical region: Secondary | ICD-10-CM
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
## 2019-03-16 NOTE — Patient Instructions (Addendum)
Your pain is caused by a pinched nerve in the lower portion of your neck.  This is from a bulging disc that is sitting the nerve. -The neck step in your treatment process is to send you to a specialist to do an injection near the nerve in your neck.  This will hopefully improve your pain to reduce the symptoms going down your arm - call me a week after the injection to let me know how you're doing. Angelita Ingles from [facility] will call you to schedule the injection -Continue taking the gabapentin prescribed to you - take three times a day.  If still bothering you, you can increase to 2 capsules three times a day. - you can also take the muscle relaxant three times a day as needed in addition to the gabapentin -Continue working on the exercises shown to you from physical therapy  We will see you back as needed.  If you only get temporary relief from this injection we can consider repeat injection in the future

## 2019-03-18 ENCOUNTER — Other Ambulatory Visit: Payer: Self-pay | Admitting: Family Medicine

## 2019-03-18 DIAGNOSIS — M5412 Radiculopathy, cervical region: Secondary | ICD-10-CM
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
## 2019-03-19 ENCOUNTER — Other Ambulatory Visit: Payer: Self-pay | Admitting: Family Medicine

## 2019-03-21 ENCOUNTER — Other Ambulatory Visit: Payer: Self-pay

## 2019-03-21 ENCOUNTER — Encounter: Payer: Self-pay | Admitting: *Deleted

## 2019-03-21 ENCOUNTER — Ambulatory Visit (INDEPENDENT_AMBULATORY_CARE_PROVIDER_SITE_OTHER): Payer: Medicare Other | Admitting: *Deleted

## 2019-03-21 DIAGNOSIS — Z0000 Encounter for general adult medical examination without abnormal findings: Secondary | ICD-10-CM | POA: Diagnosis not present

## 2019-03-21 NOTE — Patient Instructions (Signed)
Please schedule your next medicare wellness visit with me in 1 yr.  Continue to eat heart healthy diet (full of fruits, vegetables, whole grains, lean protein, water--limit salt, fat, and sugar intake) and increase physical activity as tolerated.  Continue doing brain stimulating activities (puzzles, reading, adult coloring books, staying active) to keep memory sharp.     Kimberly Escobar , Thank you for taking time to come for your Medicare Wellness Visit. I appreciate your ongoing commitment to your health goals. Please review the following plan we discussed and let me know if I can assist you in the future.   These are the goals we discussed: Goals    . continue volunteering.       This is a list of the screening recommended for you and due dates:  Health Maintenance  Topic Date Due  . Pneumonia vaccines (1 of 2 - PCV13) 09/15/2018  . HIV Screening  08/28/2028*  . Mammogram  09/10/2019  . Pap Smear  07/29/2020  . Tetanus Vaccine  11/16/2021  . Colon Cancer Screening  06/29/2022  . Flu Shot  Completed  . DEXA scan (bone density measurement)  Completed  .  Hepatitis C: One time screening is recommended by Center for Disease Control  (CDC) for  adults born from [birth year] through 1967.   Completed  *Topic was postponed. The date shown is not the original due date.    Preventive Care 6-29 Years Old, Female Preventive care refers to visits with your health care provider and lifestyle choices that can promote health and wellness. This includes:  A yearly physical exam. This may also be called an annual well check.  Regular dental visits and eye exams.  Immunizations.  Screening for certain conditions.  Healthy lifestyle choices, such as eating a healthy diet, getting regular exercise, not using drugs or products that contain nicotine and tobacco, and limiting alcohol use. What can I expect for my preventive care visit? Physical exam Your health care provider will check your:  Height  and weight. This may be used to calculate body mass index (BMI), which tells if you are at a healthy weight.  Heart rate and blood pressure.  Skin for abnormal spots. Counseling Your health care provider may ask you questions about your:  Alcohol, tobacco, and drug use.  Emotional well-being.  Home and relationship well-being.  Sexual activity.  Eating habits.  Work and work Statistician.  Method of birth control.  Menstrual cycle.  Pregnancy history. What immunizations do I need?  Influenza (flu) vaccine  This is recommended every year. Tetanus, diphtheria, and pertussis (Tdap) vaccine  You may need a Td booster every 10 years. Varicella (chickenpox) vaccine  You may need this if you have not been vaccinated. Zoster (shingles) vaccine  You may need this after age 97. Measles, mumps, and rubella (MMR) vaccine  You may need at least one dose of MMR if you were born in 1957 or later. You may also need a second dose. Pneumococcal conjugate (PCV13) vaccine  You may need this if you have certain conditions and were not previously vaccinated. Pneumococcal polysaccharide (PPSV23) vaccine  You may need one or two doses if you smoke cigarettes or if you have certain conditions. Meningococcal conjugate (MenACWY) vaccine  You may need this if you have certain conditions. Hepatitis A vaccine  You may need this if you have certain conditions or if you travel or work in places where you may be exposed to hepatitis A. Hepatitis B vaccine  You may need this if you have certain conditions or if you travel or work in places where you may be exposed to hepatitis B. Haemophilus influenzae type b (Hib) vaccine  You may need this if you have certain conditions. Human papillomavirus (HPV) vaccine  If recommended by your health care provider, you may need three doses over 6 months. You may receive vaccines as individual doses or as more than one vaccine together in one shot  (combination vaccines). Talk with your health care provider about the risks and benefits of combination vaccines. What tests do I need? Blood tests  Lipid and cholesterol levels. These may be checked every 5 years, or more frequently if you are over 50 years old.  Hepatitis C test.  Hepatitis B test. Screening  Lung cancer screening. You may have this screening every year starting at age 55 if you have a 30-pack-year history of smoking and currently smoke or have quit within the past 15 years.  Colorectal cancer screening. All adults should have this screening starting at age 50 and continuing until age 75. Your health care provider may recommend screening at age 45 if you are at increased risk. You will have tests every 1-10 years, depending on your results and the type of screening test.  Diabetes screening. This is done by checking your blood sugar (glucose) after you have not eaten for a while (fasting). You may have this done every 1-3 years.  Mammogram. This may be done every 1-2 years. Talk with your health care provider about when you should start having regular mammograms. This may depend on whether you have a family history of breast cancer.  BRCA-related cancer screening. This may be done if you have a family history of breast, ovarian, tubal, or peritoneal cancers.  Pelvic exam and Pap test. This may be done every 3 years starting at age 21. Starting at age 30, this may be done every 5 years if you have a Pap test in combination with an HPV test. Other tests  Sexually transmitted disease (STD) testing.  Bone density scan. This is done to screen for osteoporosis. You may have this scan if you are at high risk for osteoporosis. Follow these instructions at home: Eating and drinking  Eat a diet that includes fresh fruits and vegetables, whole grains, lean protein, and low-fat dairy.  Take vitamin and mineral supplements as recommended by your health care provider.  Do not  drink alcohol if: ? Your health care provider tells you not to drink. ? You are pregnant, may be pregnant, or are planning to become pregnant.  If you drink alcohol: ? Limit how much you have to 0-1 drink a day. ? Be aware of how much alcohol is in your drink. In the [place] one drink equals one 12 oz bottle of beer (355 mL), one 5 oz glass of wine (148 mL), or one 1 oz glass of hard liquor (44 mL). Lifestyle  Take daily care of your teeth and gums.  Stay active. Exercise for at least 30 minutes on 5 or more days each week.  Do not use any products that contain nicotine or tobacco, such as cigarettes, e-cigarettes, and chewing tobacco. If you need help quitting, ask your health care provider.  If you are sexually active, practice safe sex. Use a condom or other form of birth control (contraception) in order to prevent pregnancy and STIs (sexually transmitted infections).  If told by your health care provider, take low-dose aspirin daily starting   at age 35. What's next?  Visit your health care provider once a year for a well check visit.  Ask your health care provider how often you should have your eyes and teeth checked.  Stay up to date on all vaccines. This information is not intended to replace advice given to you by your health care provider. Make sure you discuss any questions you have with your health care provider. Document Revised: 09/10/2017 Document Reviewed: 09/10/2017 Elsevier Patient Education  2020 [employer].

## 2019-03-22 ENCOUNTER — Ambulatory Visit
Admission: RE | Admit: 2019-03-22 | Discharge: 2019-03-22 | Disposition: A | Discharge status: Home / Self Care | Payer: Medicare Other | Source: Ambulatory Visit | Attending: Family Medicine | Admitting: Family Medicine

## 2019-03-22 ENCOUNTER — Other Ambulatory Visit: Payer: Self-pay

## 2019-03-22 DIAGNOSIS — M5412 Radiculopathy, cervical region: Secondary | ICD-10-CM | POA: Diagnosis not present

## 2019-03-22 MED ORDER — IOPAMIDOL (ISOVUE-M 300) INJECTION 61%
1.0000 mL | Freq: Once | INTRAMUSCULAR | Status: AC
  Administered 2019-03-22: 1 mL via EPIDURAL

## 2019-03-22 MED ORDER — TRIAMCINOLONE ACETONIDE 40 MG/ML IJ SUSP (RADIOLOGY)
60.0000 mg | Freq: Once | INTRAMUSCULAR | Status: AC
  Administered 2019-03-22: 60 mg via EPIDURAL

## 2019-03-22 NOTE — Discharge Instructions (Signed)

## 2019-03-23 ENCOUNTER — Other Ambulatory Visit: Payer: Self-pay

## 2019-03-24 ENCOUNTER — Encounter: Payer: Medicare Other | Admitting: Physical Therapy

## 2019-03-24 ENCOUNTER — Other Ambulatory Visit: Payer: Self-pay

## 2019-03-24 ENCOUNTER — Ambulatory Visit (INDEPENDENT_AMBULATORY_CARE_PROVIDER_SITE_OTHER): Payer: Medicare Other | Admitting: Family Medicine

## 2019-03-24 ENCOUNTER — Encounter: Payer: Self-pay | Admitting: Family Medicine

## 2019-03-24 VITALS — BP 110/68 | HR 68 | Temp 97.1°F | Resp 18 | Ht 65.0 in | Wt 179.0 lb

## 2019-03-24 DIAGNOSIS — I10 Essential (primary) hypertension: Secondary | ICD-10-CM

## 2019-03-24 DIAGNOSIS — M5412 Radiculopathy, cervical region: Secondary | ICD-10-CM | POA: Diagnosis not present

## 2019-03-24 DIAGNOSIS — M62838 Other muscle spasm: Secondary | ICD-10-CM | POA: Diagnosis not present

## 2019-03-24 DIAGNOSIS — E785 Hyperlipidemia, unspecified: Secondary | ICD-10-CM

## 2019-03-24 HISTORY — DX: Radiculopathy, cervical region: M54.12

## 2019-03-24 LAB — LIPID PANEL
Cholesterol: 206 mg/dL — ABNORMAL HIGH (ref 0–200)
HDL: 63.3 mg/dL (ref >39.00)
LDL Cholesterol: 130 mg/dL — ABNORMAL HIGH (ref 0–99)
NonHDL: 142.9
Total CHOL/HDL Ratio: 3
Triglycerides: 63 mg/dL (ref 0.0–149.0)
VLDL: 12.6 mg/dL (ref 0.0–40.0)

## 2019-03-24 LAB — COMPREHENSIVE METABOLIC PANEL
ALT: 21 U/L (ref 0–35)
AST: 12 U/L (ref 0–37)
Albumin: 4.2 g/dL (ref 3.5–5.2)
Alkaline Phosphatase: 65 U/L (ref 39–117)
BUN: 19 mg/dL (ref 6–23)
CO2: 28 mEq/L (ref 19–32)
Calcium: 10.1 mg/dL (ref 8.4–10.5)
Chloride: 103 mEq/L (ref 96–112)
Creatinine, Ser: 0.74 mg/dL (ref 0.40–1.20)
GFR: 95.15 mL/min (ref >60.00)
Glucose, Bld: 92 mg/dL (ref 70–99)
Potassium: 4.3 mEq/L (ref 3.5–5.1)
Sodium: 138 mEq/L (ref 135–145)
Total Bilirubin: 0.4 mg/dL (ref 0.2–1.2)
Total Protein: 7.7 g/dL (ref 6.0–8.3)

## 2019-03-24 MED ORDER — CYCLOBENZAPRINE HCL 5 MG PO TABS: 5.0000 mg | ORAL_TABLET | Freq: Three times a day (TID) | ORAL | 1 refills | Status: DC | PRN

## 2019-03-24 NOTE — Patient Instructions (Signed)
DASH Eating Plan DASH stands for "Dietary Approaches to Stop Hypertension." The DASH eating plan is a healthy eating plan that has been shown to reduce high blood pressure (hypertension). It may also reduce your risk for type 2 diabetes, heart disease, and stroke. The DASH eating plan may also help with weight loss. What are tips for following this plan?  General guidelines  Avoid eating more than 2,300 mg (milligrams) of salt (sodium) a day. If you have hypertension, you may need to reduce your sodium intake to 1,500 mg a day.  Limit alcohol intake to no more than 1 drink a day for nonpregnant women and 2 drinks a day for men. One drink equals 12 oz of beer, 5 oz of wine, or 1 oz of hard liquor.  Work with your health care provider to maintain a healthy body weight or to lose weight. Ask what an ideal weight is for you.  Get at least 30 minutes of exercise that causes your heart to beat faster (aerobic exercise) most days of the week. Activities may include walking, swimming, or biking.  Work with your health care provider or diet and nutrition specialist (dietitian) to adjust your eating plan to your individual calorie needs. Reading food labels   Check food labels for the amount of sodium per serving. Choose foods with less than 5 percent of the Daily Value of sodium. Generally, foods with less than 300 mg of sodium per serving fit into this eating plan.  To find whole grains, look for the word "whole" as the first word in the ingredient list. Shopping  Buy products labeled as "low-sodium" or "no salt added."  Buy fresh foods. Avoid canned foods and premade or frozen meals. Cooking  Avoid adding salt when cooking. Use salt-free seasonings or herbs instead of table salt or sea salt. Check with your health care provider or pharmacist before using salt substitutes.  Do not fry foods. Cook foods using healthy methods such as baking, boiling, grilling, and broiling instead.  Cook with  heart-healthy oils, such as olive, canola, soybean, or sunflower oil. Meal planning  Eat a balanced diet that includes: ? 5 or more servings of fruits and vegetables each day. At each meal, try to fill half of your plate with fruits and vegetables. ? Up to 6-8 servings of whole grains each day. ? Less than 6 oz of lean meat, poultry, or fish each day. A 3-oz serving of meat is about the same size as a deck of cards. One egg equals 1 oz. ? 2 servings of low-fat dairy each day. ? A serving of nuts, seeds, or beans 5 times each week. ? Heart-healthy fats. Healthy fats called Omega-3 fatty acids are found in foods such as flaxseeds and coldwater fish, like sardines, salmon, and mackerel.  Limit how much you eat of the following: ? Canned or prepackaged foods. ? Food that is high in trans fat, such as fried foods. ? Food that is high in saturated fat, such as fatty meat. ? Sweets, desserts, sugary drinks, and other foods with added sugar. ? Full-fat dairy products.  Do not salt foods before eating.  Try to eat at least 2 vegetarian meals each week.  Eat more home-cooked food and less restaurant, buffet, and fast food.  When eating at a restaurant, ask that your food be prepared with less salt or no salt, if possible. What foods are recommended? The items listed may not be a complete list. Talk with your dietitian about   what dietary choices are best for you. Grains Whole-grain or whole-wheat bread. Whole-grain or whole-wheat pasta. Schnurr rice. Oatmeal. Quinoa. Bulgur. Whole-grain and low-sodium cereals. Pita bread. Low-fat, low-sodium crackers. Whole-wheat flour tortillas. Vegetables Fresh or frozen vegetables (raw, steamed, roasted, or grilled). Low-sodium or reduced-sodium tomato and vegetable juice. Low-sodium or reduced-sodium tomato sauce and tomato paste. Low-sodium or reduced-sodium canned vegetables. Fruits All fresh, dried, or frozen fruit. Canned fruit in natural juice (without  added sugar). Meat and other protein foods Skinless chicken or turkey. Ground chicken or turkey. Pork with fat trimmed off. Fish and seafood. Egg whites. Dried beans, peas, or lentils. Unsalted nuts, nut butters, and seeds. Unsalted canned beans. Lean cuts of beef with fat trimmed off. Low-sodium, lean deli meat. Dairy Low-fat (1%) or fat-free (skim) milk. Fat-free, low-fat, or reduced-fat cheeses. Nonfat, low-sodium ricotta or cottage cheese. Low-fat or nonfat yogurt. Low-fat, low-sodium cheese. Fats and oils Soft margarine without trans fats. Vegetable oil. Low-fat, reduced-fat, or light mayonnaise and salad dressings (reduced-sodium). Canola, safflower, olive, soybean, and sunflower oils. Avocado. Seasoning and other foods Herbs. Spices. Seasoning mixes without salt. Unsalted popcorn and pretzels. Fat-free sweets. What foods are not recommended? The items listed may not be a complete list. Talk with your dietitian about what dietary choices are best for you. Grains Baked goods made with fat, such as croissants, muffins, or some breads. Dry pasta or rice meal packs. Vegetables Creamed or fried vegetables. Vegetables in a cheese sauce. Regular canned vegetables (not low-sodium or reduced-sodium). Regular canned tomato sauce and paste (not low-sodium or reduced-sodium). Regular tomato and vegetable juice (not low-sodium or reduced-sodium). Pickles. Olives. Fruits Canned fruit in a light or heavy syrup. Fried fruit. Fruit in cream or butter sauce. Meat and other protein foods Fatty cuts of meat. Ribs. Fried meat. Bacon. Sausage. Bologna and other processed lunch meats. Salami. Fatback. Hotdogs. Bratwurst. Salted nuts and seeds. Canned beans with added salt. Canned or smoked fish. Whole eggs or egg yolks. Chicken or turkey with skin. Dairy Whole or 2% milk, cream, and half-and-half. Whole or full-fat cream cheese. Whole-fat or sweetened yogurt. Full-fat cheese. Nondairy creamers. Whipped toppings.  Processed cheese and cheese spreads. Fats and oils Butter. Stick margarine. Lard. Shortening. Ghee. Bacon fat. Tropical oils, such as coconut, palm kernel, or palm oil. Seasoning and other foods Salted popcorn and pretzels. Onion salt, garlic salt, seasoned salt, table salt, and sea salt. Worcestershire sauce. Tartar sauce. Barbecue sauce. Teriyaki sauce. Soy sauce, including reduced-sodium. Steak sauce. Canned and packaged gravies. Fish sauce. Oyster sauce. Cocktail sauce. Horseradish that you find on the shelf. Ketchup. Mustard. Meat flavorings and tenderizers. Bouillon cubes. Hot sauce and Tabasco sauce. Premade or packaged marinades. Premade or packaged taco seasonings. Relishes. Regular salad dressings. Where to find more information:  National Heart, Lung, and Blood Institute: www.nhlbi.nih.gov  American Heart Association: www.heart.org Summary  The DASH eating plan is a healthy eating plan that has been shown to reduce high blood pressure (hypertension). It may also reduce your risk for type 2 diabetes, heart disease, and stroke.  With the DASH eating plan, you should limit salt (sodium) intake to 2,300 mg a day. If you have hypertension, you may need to reduce your sodium intake to 1,500 mg a day.  When on the DASH eating plan, aim to eat more fresh fruits and vegetables, whole grains, lean proteins, low-fat dairy, and heart-healthy fats.  Work with your health care provider or diet and nutrition specialist (dietitian) to adjust your eating plan to your   individual calorie needs. This information is not intended to replace advice given to you by your health care provider. Make sure you discuss any questions you have with your health care provider. Document Revised: 12/12/2016 Document Reviewed: 12/24/2015 Elsevier Patient Education  2020 Elsevier Inc.  

## 2019-03-24 NOTE — Assessment & Plan Note (Signed)
Tolerating statin, encouraged heart healthy diet, avoid trans fats, minimize simple carbs and saturated fats. Increase exercise as tolerated 

## 2019-03-24 NOTE — Assessment & Plan Note (Signed)
Well controlled, no changes to meds. Encouraged heart healthy diet such as the DASH diet and exercise as tolerated.  °

## 2019-03-24 NOTE — Assessment & Plan Note (Signed)
Per sport med Pt had injection at [place]
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
## 2019-04-04 ENCOUNTER — Ambulatory Visit: Payer: Medicare Other | Admitting: Family Medicine

## 2019-04-07 ENCOUNTER — Encounter: Payer: Medicare Other | Admitting: Physical Therapy

## 2019-04-11 ENCOUNTER — Encounter: Payer: Self-pay | Admitting: Family Medicine

## 2019-04-11 ENCOUNTER — Other Ambulatory Visit: Payer: Self-pay

## 2019-04-11 ENCOUNTER — Ambulatory Visit (INDEPENDENT_AMBULATORY_CARE_PROVIDER_SITE_OTHER): Payer: Medicare Other | Admitting: Family Medicine

## 2019-04-11 VITALS — BP 118/78 | Ht 65.0 in | Wt 177.0 lb

## 2019-04-11 DIAGNOSIS — M5412 Radiculopathy, cervical region: Secondary | ICD-10-CM
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
## 2019-04-21 ENCOUNTER — Other Ambulatory Visit: Payer: Self-pay

## 2019-04-21 ENCOUNTER — Ambulatory Visit (INDEPENDENT_AMBULATORY_CARE_PROVIDER_SITE_OTHER): Payer: Medicare Other | Admitting: Rheumatology

## 2019-04-21 ENCOUNTER — Encounter: Payer: Self-pay | Admitting: Physician Assistant

## 2019-04-21 VITALS — BP 113/82 | HR 93 | Resp 12 | Ht 65.0 in | Wt 180.2 lb

## 2019-04-21 DIAGNOSIS — G4709 Other insomnia: Secondary | ICD-10-CM

## 2019-04-21 DIAGNOSIS — M67449 Ganglion, unspecified hand: Secondary | ICD-10-CM | POA: Diagnosis not present

## 2019-04-21 DIAGNOSIS — R768 Other specified abnormal immunological findings in serum: Secondary | ICD-10-CM

## 2019-04-21 DIAGNOSIS — M170 Bilateral primary osteoarthritis of knee: Secondary | ICD-10-CM

## 2019-04-21 DIAGNOSIS — G8929 Other chronic pain: Secondary | ICD-10-CM

## 2019-04-21 DIAGNOSIS — Z9889 Other specified postprocedural states: Secondary | ICD-10-CM

## 2019-04-21 DIAGNOSIS — L819 Disorder of pigmentation, unspecified: Secondary | ICD-10-CM

## 2019-04-21 DIAGNOSIS — M19041 Primary osteoarthritis, right hand: Secondary | ICD-10-CM | POA: Diagnosis not present

## 2019-04-21 DIAGNOSIS — M25511 Pain in right shoulder: Secondary | ICD-10-CM

## 2019-04-21 DIAGNOSIS — Z8719 Personal history of other diseases of the digestive system: Secondary | ICD-10-CM

## 2019-04-21 DIAGNOSIS — M19042 Primary osteoarthritis, left hand: Secondary | ICD-10-CM

## 2019-04-21 DIAGNOSIS — E785 Hyperlipidemia, unspecified: Secondary | ICD-10-CM

## 2019-04-21 DIAGNOSIS — M25512 Pain in left shoulder: Secondary | ICD-10-CM

## 2019-04-21 DIAGNOSIS — M533 Sacrococcygeal disorders, not elsewhere classified: Secondary | ICD-10-CM

## 2019-04-21 DIAGNOSIS — Z8669 Personal history of other diseases of the nervous system and sense organs: Secondary | ICD-10-CM

## 2019-04-21 DIAGNOSIS — M7062 Trochanteric bursitis, left hip: Secondary | ICD-10-CM

## 2019-05-27 ENCOUNTER — Telehealth: Payer: Self-pay

## 2019-05-27 MED ORDER — PREDNISONE 10 MG PO TABS: ORAL_TABLET | ORAL | 0 refills | Status: DC

## 2019-05-27 NOTE — Telephone Encounter (Signed)
Sent in prednisone dose pack for her.

## 2019-06-17 DIAGNOSIS — H25813 Combined forms of age-related cataract, bilateral: Secondary | ICD-10-CM | POA: Diagnosis not present

## 2019-06-17 DIAGNOSIS — H52223 Regular astigmatism, bilateral: Secondary | ICD-10-CM | POA: Diagnosis not present

## 2019-06-17 DIAGNOSIS — H5203 Hypermetropia, bilateral: Secondary | ICD-10-CM | POA: Diagnosis not present

## 2019-06-17 DIAGNOSIS — H524 Presbyopia: Secondary | ICD-10-CM | POA: Diagnosis not present

## 2019-07-07 DIAGNOSIS — A6000 Herpesviral infection of urogenital system, unspecified: Secondary | ICD-10-CM

## 2019-07-07 DIAGNOSIS — H02831 Dermatochalasis of right upper eyelid: Secondary | ICD-10-CM | POA: Diagnosis not present

## 2019-07-07 DIAGNOSIS — H04223 Epiphora due to insufficient drainage, bilateral lacrimal glands: Secondary | ICD-10-CM | POA: Diagnosis not present

## 2019-07-07 DIAGNOSIS — H04123 Dry eye syndrome of bilateral lacrimal glands: Secondary | ICD-10-CM | POA: Diagnosis not present

## 2019-07-07 DIAGNOSIS — H02834 Dermatochalasis of left upper eyelid: Secondary | ICD-10-CM | POA: Diagnosis not present

## 2019-07-07 DIAGNOSIS — H04553 Acquired stenosis of bilateral nasolacrimal duct: Secondary | ICD-10-CM | POA: Diagnosis not present

## 2019-07-07 HISTORY — DX: Herpesviral infection of urogenital system, unspecified: A60.00

## 2019-07-08 ENCOUNTER — Ambulatory Visit: Payer: Medicare Other | Admitting: Family Medicine

## 2019-07-11 ENCOUNTER — Encounter: Payer: Self-pay | Admitting: Obstetrics & Gynecology

## 2019-07-11 ENCOUNTER — Ambulatory Visit (INDEPENDENT_AMBULATORY_CARE_PROVIDER_SITE_OTHER): Payer: Medicare Other | Admitting: Obstetrics & Gynecology

## 2019-07-11 ENCOUNTER — Other Ambulatory Visit (HOSPITAL_COMMUNITY)
Admission: RE | Admit: 2019-07-11 | Discharge: 2019-07-11 | Disposition: A | Discharge status: Home / Self Care | Payer: Medicare Other | Source: Ambulatory Visit | Attending: Obstetrics & Gynecology | Admitting: Obstetrics & Gynecology

## 2019-07-11 ENCOUNTER — Other Ambulatory Visit: Payer: Self-pay

## 2019-07-11 VITALS — BP 118/74 | HR 91 | Ht 65.0 in | Wt 179.0 lb

## 2019-07-11 DIAGNOSIS — Z780 Asymptomatic menopausal state: Secondary | ICD-10-CM | POA: Insufficient documentation

## 2019-07-11 DIAGNOSIS — Z1151 Encounter for screening for human papillomavirus (HPV): Secondary | ICD-10-CM | POA: Insufficient documentation

## 2019-07-11 DIAGNOSIS — M8580 Other specified disorders of bone density and structure, unspecified site: Secondary | ICD-10-CM | POA: Diagnosis not present

## 2019-07-11 DIAGNOSIS — Z1239 Encounter for other screening for malignant neoplasm of breast: Secondary | ICD-10-CM | POA: Diagnosis not present

## 2019-07-11 DIAGNOSIS — Z01419 Encounter for gynecological examination (general) (routine) without abnormal findings: Secondary | ICD-10-CM

## 2019-07-11 NOTE — Patient Instructions (Signed)
Osteopenia  Osteopenia is a loss of thickness (density) inside of the bones. Another name for osteopenia is low bone mass. Mild osteopenia is a normal part of aging. It is not a disease, and it does not cause symptoms. However, if you have osteopenia and continue to lose bone mass, you could develop a condition that causes the bones to become thin and break more easily (osteoporosis). You may also lose some height, have back pain, and have a stooped posture. Although osteopenia is not a disease, making changes to your lifestyle and diet can help to prevent osteopenia from developing into osteoporosis. What are the causes? Osteopenia is caused by loss of calcium in the bones.  Bones are constantly changing. Old bone cells are continually being replaced with new bone cells. This process builds new bone. The mineral calcium is needed to build new bone and maintain bone density. Bone density is usually highest around age 35. After that, most people's bodies cannot replace all the bone they have lost with new bone. What increases the risk? You are more likely to develop this condition if:  You are older than age 50.  You are a woman who went through menopause early.  You have a long illness that keeps you in bed.  You do not get enough exercise.  You lack certain nutrients (malnutrition).  You have an overactive thyroid gland (hyperthyroidism).  You smoke.  You drink a lot of alcohol.  You are taking medicines that weaken the bones, such as steroids. What are the signs or symptoms? This condition does not cause any symptoms. You may have a slightly higher risk for bone breaks (fractures), so getting fractures more easily than normal may be an indication of osteopenia. How is this diagnosed? Your health care provider can diagnose this condition with a special type of X-ray exam that measures bone density (dual-energy X-ray absorptiometry, DEXA). This test can measure bone density in your  hips, spine, and wrists. Osteopenia has no symptoms, so this condition is usually diagnosed after a routine bone density screening test is done for osteoporosis. This routine screening is usually done for:  Women who are age 65 or older.  Men who are age 70 or older. If you have risk factors for osteopenia, you may have the screening test at an earlier age. How is this treated? Making dietary and lifestyle changes can lower your risk for osteoporosis. If you have severe osteopenia that is close to becoming osteoporosis, your health care provider may prescribe medicines and dietary supplements such as calcium and vitamin D. These supplements help to rebuild bone density. Follow these instructions at home:   Take over-the-counter and prescription medicines only as told by your health care provider. These include vitamins and supplements.  Eat a diet that is high in calcium and vitamin D. ? Calcium is found in dairy products, beans, salmon, and leafy green vegetables like spinach and broccoli. ? Look for foods that have vitamin D and calcium added to them (fortified foods), such as orange juice, cereal, and bread.  Do 30 or more minutes of a weight-bearing exercise every day, such as walking, jogging, or playing a sport. These types of exercises strengthen the bones.  Take precautions at home to lower your risk of falling, such as: ? Keeping rooms well-lit and free of clutter, such as cords. ? Installing safety rails on stairs. ? Using rubber mats in the bathroom or other areas that are often wet or slippery.  Do not use   any products that contain nicotine or tobacco, such as cigarettes and e-cigarettes. If you need help quitting, ask your health care provider.  Avoid alcohol or limit alcohol intake to no more than 1 drink a day for nonpregnant women and 2 drinks a day for men. One drink equals 12 oz of beer, 5 oz of wine, or 1 oz of hard liquor.  Keep all follow-up visits as told by your  health care provider. This is important. Contact a health care provider if:  You have not had a bone density screening for osteoporosis and you are: ? A woman, age 65 or older. ? A man, age 70 or older.  You are a postmenopausal woman who has not had a bone density screening for osteoporosis.  You are older than age 50 and you want to know if you should have bone density screening for osteoporosis. Summary  Osteopenia is a loss of thickness (density) inside of the bones. Another name for osteopenia is low bone mass.  Osteopenia is not a disease, but it may increase your risk for a condition that causes the bones to become thin and break more easily (osteoporosis).  You may be at risk for osteopenia if you are older than age 50 or if you are a woman who went through early menopause.  Osteopenia does not cause any symptoms, but it can be diagnosed with a bone density screening test.  Dietary and lifestyle changes are the first treatment for osteopenia. These may lower your risk for osteoporosis. This information is not intended to replace advice given to you by your health care provider. Make sure you discuss any questions you have with your health care provider. Document Revised: 12/12/2016 Document Reviewed: 10/08/2016 Elsevier Patient Education  2020 Elsevier Inc.  

## 2019-07-11 NOTE — Progress Notes (Signed)
Subjective:     Kimberly Escobar is a 64 y.o. female here for a routine exam.  Current complaints: none. Pt was supposed to f/u in 6 weeks but, did not come. Her husband was upset that she mentioned their issue to me however after she spoke to him, their issue improved and they are now having reg intercourse with no issues of being upset on her part! Her husbnad is now retired.     Gynecologic History No LMP recorded. Patient is postmenopausal. Contraception: post menopausal status Last Pap: 07/31/2017. Results were: normal Last mammogram: 09/10/2018. Results were: normal  Obstetric History OB History  Gravida Para Term Preterm AB Living  4 4 3     4   SAB TAB Ectopic Multiple Live Births          4    # Outcome Date GA Lbr Len/2nd Weight Sex Delivery Anes PTL Lv  4 Term 1994 [redacted]w[redacted]d   M CS-Classical EPI  LIV  3 Para 1977 [redacted]w[redacted]d   M CS-Classical EPI  LIV  2 Term 56    M CS-Classical EPI  LIV  1 Term 1975 [redacted]w[redacted]d   M CS-Classical EPI  LIV   The following portions of the patient's history were reviewed and updated as appropriate: allergies, current medications, past family history, past medical history, past social history, past surgical history and problem list.  Review of Systems Pertinent items are noted in HPI.    Objective:  BP 118/74   Pulse 91   Ht 5\' 5"  (1.651 m)   Wt 179 lb (81.2 kg)   BMI 29.79 kg/m  General Appearance:    Very pleasant lady with great sense of hiumor! Alert, cooperative, no distress, appears stated age  Head:    Normocephalic, without obvious abnormality, atraumatic  Eyes:    conjunctiva/corneas clear, EOM's intact, both eyes  Ears:    Normal external ear canals, both ears  Nose:   Nares normal, septum midline, mucosa normal, no drainage    or sinus tenderness  Throat:   Lips, mucosa, and tongue normal; teeth and gums normal  Neck:   Supple, symmetrical, trachea midline, no adenopathy;    thyroid:  no enlargement/tenderness/nodules  Back:     Symmetric,  no curvature, ROM normal, no CVA tenderness  Lungs:     respirations unlabored  Chest Wall:    No tenderness or deformity   Heart:    Regular rate and rhythm  Breast Exam:    No tenderness, masses, or nipple abnormality  Abdomen:     Soft, non-tender, bowel sounds active all four quadrants,    no masses, no organomegaly; well healed vertical incision.   Genitalia:    Normal female without lesion, discharge or tenderness   On mons pubis there are some skin changes on the labia majus. (Pt recently used shaving cream for legs on her mons).     Extremities:   Extremities normal, atraumatic, no cyanosis or edema  Pulses:   2+ and symmetric all extremities  Skin:   Skin color, texture, turgor normal, no rashes or lesions     Assessment:    Healthy female exam.   Osteopenia Breast cancer screen    Plan:   [name] was seen today for annual exam.  Diagnoses and all orders for this visit:  Well female exam with routine gynecological exam -     Cytology - PAP( [place])  Osteopenia after menopause -     DG Bone Density; Future  Breast cancer screening other than mammogram -     MM Digital Screening; Future  f/u in 1 year or sooner prn   Pandora Mccrackin L. Harraway-Smith, M.D., [place]nn June

## 2019-07-12 ENCOUNTER — Ambulatory Visit: Payer: Medicare Other | Admitting: Family Medicine

## 2019-07-12 VITALS — BP 118/72 | Ht 65.0 in | Wt 178.0 lb

## 2019-07-12 DIAGNOSIS — G5701 Lesion of sciatic nerve, right lower limb: Secondary | ICD-10-CM | POA: Insufficient documentation

## 2019-07-12 HISTORY — DX: Lesion of sciatic nerve, right lower limb: G57.01

## 2019-07-12 LAB — CYTOLOGY - PAP
Comment: NEGATIVE
Diagnosis: NEGATIVE
High risk HPV: NEGATIVE

## 2019-07-12 NOTE — Assessment & Plan Note (Signed)
Expect piriformis syndrome, will give her home rehab therapy exercises to do.  Discussed holding off on gabapentin as per expect that should start resolving soon.  May continue activity as tolerated but should probably try to avoid lunges/squats/stairs and uphill walking  Can follow-up in 6 weeks

## 2019-07-12 NOTE — Patient Instructions (Signed)
You have piriformis syndrome with sciatica. Try to avoid painful activities when possible (usually hills, stairs bother this). Pick 2-3 stretches where you feel the pull in the area of pain - do 3 of these and hold for 20-30 seconds twice a day. Standing hip rotation, side raises 3 sets of 10 once a day - add ankle weight if these become too easy. Consider lacrosse ball or tennis ball massage to the area. Consider physical therapy. Follow up with me in 1 month.

## 2019-07-12 NOTE — Progress Notes (Signed)
    SUBJECTIVE:   CHIEF COMPLAINT / HPI: "glute" pain  Patient with right-sided pain that she identifies as starting in her gluteus and radiating down her right leg to her right pinky toe.  She says that this started about 3 weeks ago, is worse when she is standing for long periods of time to wash the dishes.  She feels it does get somewhat better if she leans over-the-counter and puts her elbows on the counter.  She has not noticed any pain with extension but she does not potentially do extension exercises.  There has been no trauma recently for her there are no other physical symptoms to complain of that have been changed for her.  She says approximately 3 months ago she did increase her activity level and started using elliptical regularly as well as some "old set up machine "that she had bought off the TV a long time ago.  PERTINENT  PMH / PSH:   OBJECTIVE:   BP 118/72   Ht 5\' 5"  (1.651 m)   Wt 178 lb (80.7 kg)   BMI 29.62 kg/m   General: Alert and pleasant no distress  Right hip exam: Inspection: No obvious visible asymmetry or abnormality Palpation: No bony abnormality, there is pain over the piriformis to to palpation ROM: Range of motion is normal Strength: Strength is normal with flexion extension and abduction abduction Stability: No indication of instability Special tests: Negative straight leg, FABER, FADIR. Neurovascular: No vascular deficits found, no reproduction of neurological symptoms claimed during exam  ASSESSMENT/PLAN:   Piriformis syndrome of right side Expect piriformis syndrome, will give her home rehab therapy exercises to do.  Discussed holding off on gabapentin as per expect that should start resolving soon.  May continue activity as tolerated but should probably try to avoid lunges/squats/stairs and uphill walking  Can follow-up in 6 weeks     [place]

## 2019-07-13 ENCOUNTER — Encounter: Payer: Self-pay | Admitting: Family Medicine

## 2019-07-29 ENCOUNTER — Ambulatory Visit: Payer: Medicare Other | Admitting: Family Medicine

## 2019-07-29 ENCOUNTER — Encounter: Payer: Self-pay | Admitting: Family Medicine

## 2019-07-29 ENCOUNTER — Other Ambulatory Visit: Payer: Self-pay

## 2019-07-29 VITALS — BP 118/78 | HR 79 | Temp 98.3°F | Resp 18 | Ht 65.0 in | Wt 180.4 lb

## 2019-07-29 DIAGNOSIS — R600 Localized edema: Secondary | ICD-10-CM

## 2019-07-29 DIAGNOSIS — I83893 Varicose veins of bilateral lower extremities with other complications: Secondary | ICD-10-CM

## 2019-07-29 MED ORDER — FUROSEMIDE 40 MG PO TABS: 40.0000 mg | ORAL_TABLET | Freq: Every day | ORAL | 3 refills | Status: DC

## 2019-07-29 MED ORDER — NONFORMULARY OR COMPOUNDED ITEM: 1 refills | Status: DC
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
## 2019-07-29 NOTE — Patient Instructions (Signed)

## 2019-07-31 NOTE — Assessment & Plan Note (Signed)
D/c hctz and start lasix Compression socks Refer to vascular and check [place] legs  Elevate legs

## 2019-08-01 ENCOUNTER — Ambulatory Visit (HOSPITAL_BASED_OUTPATIENT_CLINIC_OR_DEPARTMENT_OTHER)
Admission: RE | Admit: 2019-08-01 | Discharge: 2019-08-01 | Disposition: A | Discharge status: Home / Self Care | Payer: Medicare Other | Source: Ambulatory Visit | Attending: Family Medicine | Admitting: Family Medicine

## 2019-08-01 ENCOUNTER — Other Ambulatory Visit (INDEPENDENT_AMBULATORY_CARE_PROVIDER_SITE_OTHER): Payer: Medicare Other

## 2019-08-01 ENCOUNTER — Other Ambulatory Visit: Payer: Self-pay

## 2019-08-01 DIAGNOSIS — I83893 Varicose veins of bilateral lower extremities with other complications: Secondary | ICD-10-CM | POA: Diagnosis not present

## 2019-08-01 DIAGNOSIS — R600 Localized edema: Secondary | ICD-10-CM

## 2019-08-01 DIAGNOSIS — M7989 Other specified soft tissue disorders: Secondary | ICD-10-CM | POA: Diagnosis not present

## 2019-08-01 LAB — BASIC METABOLIC PANEL
BUN: 17 mg/dL (ref 6–23)
CO2: 26 mEq/L (ref 19–32)
Calcium: 9.4 mg/dL (ref 8.4–10.5)
Chloride: 108 mEq/L (ref 96–112)
Creatinine, Ser: 0.74 mg/dL (ref 0.40–1.20)
GFR: 95.05 mL/min (ref >60.00)
Glucose, Bld: 83 mg/dL (ref 70–99)
Potassium: 4.3 mEq/L (ref 3.5–5.1)
Sodium: 141 mEq/L (ref 135–145)

## 2019-08-01 NOTE — Addendum Note (Signed)
Addended by: Caffie Pinto on: 08/01/2019 08:27 AM   Modules accepted: Orders

## 2019-08-01 NOTE — Addendum Note (Signed)
Addended by: Caffie Pinto on: 08/01/2019 08:28 AM   Modules accepted: Orders

## 2019-08-01 NOTE — Addendum Note (Signed)
Addended by: Caffie Pinto on: 08/01/2019 08:30 AM   Modules accepted: Orders

## 2019-08-10 ENCOUNTER — Ambulatory Visit: Payer: Medicare Other | Admitting: Family Medicine

## 2019-08-23 ENCOUNTER — Other Ambulatory Visit: Payer: Self-pay | Admitting: *Deleted

## 2019-08-23 DIAGNOSIS — M79604 Pain in right leg: Secondary | ICD-10-CM

## 2019-08-24 ENCOUNTER — Other Ambulatory Visit: Payer: Self-pay

## 2019-08-24 ENCOUNTER — Ambulatory Visit (HOSPITAL_COMMUNITY)
Admission: RE | Admit: 2019-08-24 | Discharge: 2019-08-24 | Disposition: A | Discharge status: Home / Self Care | Payer: Medicare Other | Source: Ambulatory Visit | Attending: Vascular Surgery | Admitting: Vascular Surgery

## 2019-08-24 DIAGNOSIS — M79604 Pain in right leg: Secondary | ICD-10-CM | POA: Insufficient documentation

## 2019-08-24 DIAGNOSIS — M79605 Pain in left leg: Secondary | ICD-10-CM | POA: Diagnosis not present

## 2019-08-25 ENCOUNTER — Ambulatory Visit (INDEPENDENT_AMBULATORY_CARE_PROVIDER_SITE_OTHER): Payer: Medicare Other | Admitting: Physician Assistant

## 2019-08-25 VITALS — BP 120/78 | HR 79 | Resp 16 | Ht 65.0 in | Wt 178.0 lb

## 2019-08-25 DIAGNOSIS — I872 Venous insufficiency (chronic) (peripheral): Secondary | ICD-10-CM

## 2019-08-25 DIAGNOSIS — I8393 Asymptomatic varicose veins of bilateral lower extremities: Secondary | ICD-10-CM
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
## 2019-09-07 DIAGNOSIS — H10023 Other mucopurulent conjunctivitis, bilateral: Secondary | ICD-10-CM | POA: Diagnosis not present

## 2019-09-12 ENCOUNTER — Ambulatory Visit (HOSPITAL_BASED_OUTPATIENT_CLINIC_OR_DEPARTMENT_OTHER)
Admission: RE | Admit: 2019-09-12 | Discharge: 2019-09-12 | Disposition: A | Discharge status: Home / Self Care | Payer: Medicare Other | Source: Ambulatory Visit | Attending: Obstetrics & Gynecology | Admitting: Obstetrics & Gynecology

## 2019-09-12 ENCOUNTER — Other Ambulatory Visit: Payer: Self-pay

## 2019-09-12 ENCOUNTER — Encounter (HOSPITAL_BASED_OUTPATIENT_CLINIC_OR_DEPARTMENT_OTHER): Payer: Self-pay

## 2019-09-12 DIAGNOSIS — Z1231 Encounter for screening mammogram for malignant neoplasm of breast: Secondary | ICD-10-CM | POA: Diagnosis not present

## 2019-09-12 DIAGNOSIS — Z13820 Encounter for screening for osteoporosis: Secondary | ICD-10-CM | POA: Insufficient documentation

## 2019-09-12 DIAGNOSIS — M8580 Other specified disorders of bone density and structure, unspecified site: Secondary | ICD-10-CM | POA: Insufficient documentation

## 2019-09-12 DIAGNOSIS — Z1239 Encounter for other screening for malignant neoplasm of breast: Secondary | ICD-10-CM

## 2019-09-12 DIAGNOSIS — M8589 Other specified disorders of bone density and structure, multiple sites: Secondary | ICD-10-CM | POA: Diagnosis not present

## 2019-09-12 DIAGNOSIS — Z780 Asymptomatic menopausal state: Secondary | ICD-10-CM

## 2019-09-12 DIAGNOSIS — R29890 Loss of height: Secondary | ICD-10-CM | POA: Diagnosis not present
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
## 2019-09-20 ENCOUNTER — Encounter: Payer: Medicare Other | Admitting: Family Medicine

## 2019-09-28 ENCOUNTER — Telehealth: Payer: Self-pay

## 2019-09-28 ENCOUNTER — Other Ambulatory Visit: Payer: Self-pay

## 2019-09-28 ENCOUNTER — Ambulatory Visit: Payer: Medicare Other | Admitting: Rheumatology

## 2019-09-28 ENCOUNTER — Encounter: Payer: Self-pay | Admitting: Rheumatology

## 2019-09-28 VITALS — BP 118/78 | HR 70 | Resp 13 | Ht 65.0 in | Wt 179.0 lb

## 2019-09-28 DIAGNOSIS — M25512 Pain in left shoulder: Secondary | ICD-10-CM

## 2019-09-28 DIAGNOSIS — M170 Bilateral primary osteoarthritis of knee: Secondary | ICD-10-CM | POA: Diagnosis not present

## 2019-09-28 DIAGNOSIS — E785 Hyperlipidemia, unspecified: Secondary | ICD-10-CM

## 2019-09-28 DIAGNOSIS — M25511 Pain in right shoulder: Secondary | ICD-10-CM | POA: Diagnosis not present

## 2019-09-28 DIAGNOSIS — M19042 Primary osteoarthritis, left hand: Secondary | ICD-10-CM

## 2019-09-28 DIAGNOSIS — M533 Sacrococcygeal disorders, not elsewhere classified: Secondary | ICD-10-CM | POA: Diagnosis not present

## 2019-09-28 DIAGNOSIS — M7062 Trochanteric bursitis, left hip: Secondary | ICD-10-CM

## 2019-09-28 DIAGNOSIS — R768 Other specified abnormal immunological findings in serum: Secondary | ICD-10-CM

## 2019-09-28 DIAGNOSIS — M19041 Primary osteoarthritis, right hand: Secondary | ICD-10-CM

## 2019-09-28 DIAGNOSIS — G8929 Other chronic pain: Secondary | ICD-10-CM

## 2019-09-28 DIAGNOSIS — Z8719 Personal history of other diseases of the digestive system: Secondary | ICD-10-CM

## 2019-09-28 DIAGNOSIS — G4709 Other insomnia: Secondary | ICD-10-CM

## 2019-09-28 DIAGNOSIS — Z7189 Other specified counseling: Secondary | ICD-10-CM

## 2019-09-28 DIAGNOSIS — Z8669 Personal history of other diseases of the nervous system and sense organs: Secondary | ICD-10-CM

## 2019-09-28 DIAGNOSIS — L819 Disorder of pigmentation, unspecified: Secondary | ICD-10-CM

## 2019-09-28 DIAGNOSIS — Z9889 Other specified postprocedural states: Secondary | ICD-10-CM

## 2019-09-28 DIAGNOSIS — M8589 Other specified disorders of bone density and structure, multiple sites: Secondary | ICD-10-CM

## 2019-09-28 MED ORDER — LIDOCAINE HCL 1 % IJ SOLN
1.5000 mL | INTRAMUSCULAR | Status: AC | PRN
  Administered 2019-09-28: 1.5 mL

## 2019-09-28 MED ORDER — TRIAMCINOLONE ACETONIDE 40 MG/ML IJ SUSP
40.0000 mg | INTRAMUSCULAR | Status: AC | PRN
  Administered 2019-09-28: 40 mg via INTRA_ARTICULAR

## 2019-09-28 NOTE — Telephone Encounter (Signed)
Please apply for bilateral knee visco, per Dr. Deveshwar. Thanks!  

## 2019-09-28 NOTE — Patient Instructions (Signed)
Journal for Nurse Practitioners, 15(4), 263-267. Retrieved October 19, 2017 from http://clinicalkey.com/nursing">  Knee Exercises Ask your health care provider which exercises are safe for you. Do exercises exactly as told by your health care provider and adjust them as directed. It is normal to feel mild stretching, pulling, tightness, or discomfort as you do these exercises. Stop right away if you feel sudden pain or your pain gets worse. Do not begin these exercises until told by your health care provider. Stretching and range-of-motion exercises These exercises warm up your muscles and joints and improve the movement and flexibility of your knee. These exercises also help to relieve pain and swelling. Knee extension, prone 1. Lie on your abdomen (prone position) on a bed. 2. Place your left / right knee just beyond the edge of the surface so your knee is not on the bed. You can put a towel under your left / right thigh just above your kneecap for comfort. 3. Relax your leg muscles and allow gravity to straighten your knee (extension). You should feel a stretch behind your left / right knee. 4. Hold this position for __________ seconds. 5. Scoot up so your knee is supported between repetitions. Repeat __________ times. Complete this exercise __________ times a day. Knee flexion, active  1. Lie on your back with both legs straight. If this causes back discomfort, bend your left / right knee so your foot is flat on the floor. 2. Slowly slide your left / right heel back toward your buttocks. Stop when you feel a gentle stretch in the front of your knee or thigh (flexion). 3. Hold this position for __________ seconds. 4. Slowly slide your left / right heel back to the starting position. Repeat __________ times. Complete this exercise __________ times a day. Quadriceps stretch, prone  1. Lie on your abdomen on a firm surface, such as a bed or padded floor. 2. Bend your left / right knee and hold  your ankle. If you cannot reach your ankle or pant leg, loop a belt around your foot and grab the belt instead. 3. Gently pull your heel toward your buttocks. Your knee should not slide out to the side. You should feel a stretch in the front of your thigh and knee (quadriceps). 4. Hold this position for __________ seconds. Repeat __________ times. Complete this exercise __________ times a day. Hamstring, supine 1. Lie on your back (supine position). 2. Loop a belt or towel over the ball of your left / right foot. The ball of your foot is on the walking surface, right under your toes. 3. Straighten your left / right knee and slowly pull on the belt to raise your leg until you feel a gentle stretch behind your knee (hamstring). ? Do not let your knee bend while you do this. ? Keep your other leg flat on the floor. 4. Hold this position for __________ seconds. Repeat __________ times. Complete this exercise __________ times a day. Strengthening exercises These exercises build strength and endurance in your knee. Endurance is the ability to use your muscles for a long time, even after they get tired. Quadriceps, isometric This exercise stretches the muscles in front of your thigh (quadriceps) without moving your knee joint (isometric). 1. Lie on your back with your left / right leg extended and your other knee bent. Put a rolled towel or small pillow under your knee if told by your health care provider. 2. Slowly tense the muscles in the front of your left /   right thigh. You should see your kneecap slide up toward your hip or see increased dimpling just above the knee. This motion will push the back of the knee toward the floor. 3. For __________ seconds, hold the muscle as tight as you can without increasing your pain. 4. Relax the muscles slowly and completely. Repeat __________ times. Complete this exercise __________ times a day. Straight leg raises This exercise stretches the muscles in front  of your thigh (quadriceps) and the muscles that move your hips (hip flexors). 1. Lie on your back with your left / right leg extended and your other knee bent. 2. Tense the muscles in the front of your left / right thigh. You should see your kneecap slide up or see increased dimpling just above the knee. Your thigh may even shake a bit. 3. Keep these muscles tight as you raise your leg 4-6 inches (10-15 cm) off the floor. Do not let your knee bend. 4. Hold this position for __________ seconds. 5. Keep these muscles tense as you lower your leg. 6. Relax your muscles slowly and completely after each repetition. Repeat __________ times. Complete this exercise __________ times a day. Hamstring, isometric 1. Lie on your back on a firm surface. 2. Bend your left / right knee about __________ degrees. 3. Dig your left / right heel into the surface as if you are trying to pull it toward your buttocks. Tighten the muscles in the back of your thighs (hamstring) to "dig" as hard as you can without increasing any pain. 4. Hold this position for __________ seconds. 5. Release the tension gradually and allow your muscles to relax completely for __________ seconds after each repetition. Repeat __________ times. Complete this exercise __________ times a day. Hamstring curls If told by your health care provider, do this exercise while wearing ankle weights. Begin with __________ lb weights. Then increase the weight by 1 lb (0.5 kg) increments. Do not wear ankle weights that are more than __________ lb. 1. Lie on your abdomen with your legs straight. 2. Bend your left / right knee as far as you can without feeling pain. Keep your hips flat against the floor. 3. Hold this position for __________ seconds. 4. Slowly lower your leg to the starting position. Repeat __________ times. Complete this exercise __________ times a day. Squats This exercise strengthens the muscles in front of your thigh and knee  (quadriceps). 1. Stand in front of a table, with your feet and knees pointing straight ahead. You may rest your hands on the table for balance but not for support. 2. Slowly bend your knees and lower your hips like you are going to sit in a chair. ? Keep your weight over your heels, not over your toes. ? Keep your lower legs upright so they are parallel with the table legs. ? Do not let your hips go lower than your knees. ? Do not bend lower than told by your health care provider. ? If your knee pain increases, do not bend as low. 3. Hold the squat position for __________ seconds. 4. Slowly push with your legs to return to standing. Do not use your hands to pull yourself to standing. Repeat __________ times. Complete this exercise __________ times a day. Wall slides This exercise strengthens the muscles in front of your thigh and knee (quadriceps). 1. Lean your back against a smooth wall or door, and walk your feet out 18-24 inches (46-61 cm) from it. 2. Place your feet hip-width apart. 3.   Slowly slide down the wall or door until your knees bend __________ degrees. Keep your knees over your heels, not over your toes. Keep your knees in line with your hips. 4. Hold this position for __________ seconds. Repeat __________ times. Complete this exercise __________ times a day. Straight leg raises This exercise strengthens the muscles that rotate the leg at the hip and move it away from your body (hip abductors). 1. Lie on your side with your left / right leg in the top position. Lie so your head, shoulder, knee, and hip line up. You may bend your bottom knee to help you keep your balance. 2. Roll your hips slightly forward so your hips are stacked directly over each other and your left / right knee is facing forward. 3. Leading with your heel, lift your top leg 4-6 inches (10-15 cm). You should feel the muscles in your outer hip lifting. ? Do not let your foot drift forward. ? Do not let your knee  roll toward the ceiling. 4. Hold this position for __________ seconds. 5. Slowly return your leg to the starting position. 6. Let your muscles relax completely after each repetition. Repeat __________ times. Complete this exercise __________ times a day. Straight leg raises This exercise stretches the muscles that move your hips away from the front of the pelvis (hip extensors). 1. Lie on your abdomen on a firm surface. You can put a pillow under your hips if that is more comfortable. 2. Tense the muscles in your buttocks and lift your left / right leg about 4-6 inches (10-15 cm). Keep your knee straight as you lift your leg. 3. Hold this position for __________ seconds. 4. Slowly lower your leg to the starting position. 5. Let your leg relax completely after each repetition. Repeat __________ times. Complete this exercise __________ times a day. This information is not intended to replace advice given to you by your health care provider. Make sure you discuss any questions you have with your health care provider. Document Revised: 10/20/2017 Document Reviewed: 10/20/2017 Elsevier Patient Education  2020 [employer]  

## 2019-09-30 NOTE — Telephone Encounter (Signed)
Submitted for VOB 09/30/2019.

## 2019-10-05 DIAGNOSIS — H04203 Unspecified epiphora, bilateral lacrimal glands: Secondary | ICD-10-CM | POA: Diagnosis not present

## 2019-10-05 DIAGNOSIS — H04123 Dry eye syndrome of bilateral lacrimal glands: Secondary | ICD-10-CM | POA: Diagnosis not present

## 2019-10-05 DIAGNOSIS — H04553 Acquired stenosis of bilateral nasolacrimal duct: Secondary | ICD-10-CM | POA: Diagnosis not present

## 2019-10-05 NOTE — Telephone Encounter (Signed)
Please call to schedule Visco knee injections.  Authorized for Synvisc series Bilateral Knees  Buy and bill Deductible does not apply. Insurance to cover 100% of injection cost with a $35.00 copay, and 80% of medication cost. Patient to pay $35.00 copay each visit, and 20% medication cost.

## 2019-10-07 NOTE — Telephone Encounter (Signed)
I LMOM for patient to call for benefits, and to schedule appointments.

## 2019-11-04 ENCOUNTER — Other Ambulatory Visit: Payer: Self-pay

## 2019-11-04 ENCOUNTER — Ambulatory Visit (INDEPENDENT_AMBULATORY_CARE_PROVIDER_SITE_OTHER): Payer: Medicare Other | Admitting: Family Medicine

## 2019-11-04 ENCOUNTER — Encounter: Payer: Self-pay | Admitting: Family Medicine

## 2019-11-04 VITALS — BP 110/70 | HR 86 | Temp 99.5°F | Resp 18 | Ht 65.0 in | Wt 175.6 lb

## 2019-11-04 DIAGNOSIS — Z0000 Encounter for general adult medical examination without abnormal findings: Secondary | ICD-10-CM | POA: Diagnosis not present

## 2019-11-04 DIAGNOSIS — Z8249 Family history of ischemic heart disease and other diseases of the circulatory system: Secondary | ICD-10-CM

## 2019-11-04 DIAGNOSIS — R600 Localized edema: Secondary | ICD-10-CM | POA: Diagnosis not present

## 2019-11-04 DIAGNOSIS — Z23 Encounter for immunization: Secondary | ICD-10-CM

## 2019-11-04 DIAGNOSIS — E785 Hyperlipidemia, unspecified: Secondary | ICD-10-CM | POA: Diagnosis not present

## 2019-11-04 MED ORDER — FUROSEMIDE 40 MG PO TABS: 40.0000 mg | ORAL_TABLET | Freq: Every day | ORAL | 3 refills | Status: DC

## 2019-11-04 NOTE — Progress Notes (Signed)
Subjective:     Kimberly Escobar is a 64 y.o. female and is here for a comprehensive physical exam. The patient reports no problems. Her 66 yo sister just had bypass surgery--- she is concerned about her risk.  She has had no cp, no sob, no palpitations   Social History   Socioeconomic History  . Marital status: Married    Spouse name: Not on file  . Number of children: 4  . Years of education: Not on file  . Highest education level: Not on file  Occupational History  . Occupation: housewife    Employer: OTHER  Tobacco Use  . Smoking status: Never Smoker  . Smokeless tobacco: Never Used  Vaping Use  . Vaping Use: Never used  Substance and Sexual Activity  . Alcohol use: Yes    Comment: occ  . Drug use: No  . Sexual activity: Yes    Partners: Male    Birth control/protection: None  Other Topics Concern  . Not on file  Social History Narrative   Exercise --no secondary to plantar fascitis   Social Determinants of Health   Financial Resource Strain: Low Risk   . Difficulty of Paying Living Expenses: Not very hard  Food Insecurity: No Food Insecurity  . Worried About Charity fundraiser in the Last Year: Never true  . Ran Out of Food in the Last Year: Never true  Transportation Needs: No Transportation Needs  . Lack of Transportation (Medical): No  . Lack of Transportation (Non-Medical): No  Physical Activity:   . Days of Exercise per Week: Not on file  . Minutes of Exercise per Session: Not on file  Stress:   . Feeling of Stress : Not on file  Social Connections:   . Frequency of Communication with Friends and Family: Not on file  . Frequency of Social Gatherings with Friends and Family: Not on file  . Attends Religious Services: Not on file  . Active Member of Clubs or Organizations: Not on file  . Attends Archivist Meetings: Not on file  . Marital Status: Not on file  Intimate Partner Violence:   . Fear of Current or Ex-Partner: Not on file  .  Emotionally Abused: Not on file  . Physically Abused: Not on file  . Sexually Abused: Not on file   Health Maintenance  Topic Date Due  . PNA vac Low Risk Adult (1 of 2 - PCV13) Never done  . INFLUENZA VACCINE  08/14/2019  . MAMMOGRAM  09/11/2020  . TETANUS/TDAP  11/16/2021  . COLONOSCOPY  06/29/2022  . DEXA SCAN  Completed  . COVID-19 Vaccine  Completed  . Hepatitis C Screening  Completed    The following portions of the patient's history were reviewed and updated as appropriate:  She  has a past medical history of Acute costochondritis, Arthritis, Back pain, Carpal tunnel syndrome of right wrist, Environmental allergies, Migraines, Plantar fasciitis, and Wears glasses. She does not have any pertinent problems on file. She  has a past surgical history that includes Cesarean section; Colonoscopy; Upper gastrointestinal endoscopy; Tubal ligation; Carpal tunnel release (Right, 02/02/2014); and Carpal tunnel release (Left, 02/23/2018). Her family history includes Breast cancer (age of onset: 54) in her sister; CAD in her sister; Cancer in her brother; Diabetes in her brother; Heart disease in her brother, mother, and sister; Hypertension in her brother, brother, mother, and sister; Stroke in her sister; Stroke (age of onset: 6) in her sister. She  reports that  she has never smoked. She has never used smokeless tobacco. She reports current alcohol use. She reports that she does not use drugs. She has a current medication list which includes the following prescription(s): acyclovir, acyclovir ointment, cyclobenzaprine, furosemide, ginger (zingiber officinalis), multivitamin-lutein, NONFORMULARY OR COMPOUNDED ITEM, nutritional supplements, tramadol, turmeric, and aspirin. Current Outpatient Medications on File Prior to Visit  Medication Sig Dispense Refill  . acyclovir (ZOVIRAX) 400 MG tablet Take 1 tablet (400 mg total) by mouth as needed. 60 tablet 5  . acyclovir ointment (ZOVIRAX) 5 % Apply  topically 3 hours as needed 15 g 5  . cyclobenzaprine (FLEXERIL) 5 MG tablet Take 1 tablet (5 mg total) by mouth 3 (three) times daily as needed for muscle spasms. 60 tablet 1  . Ginger, Zingiber officinalis, (GINGER ROOT PO) Take by mouth.    . multivitamin-lutein (OCUVITE-LUTEIN) CAPS Take 1 capsule by mouth daily.    . NONFORMULARY OR COMPOUNDED ITEM Compression socks   20-30 mm / hg  #1  As needed   Dx edema b/l low ext and varicose veins 1 each 1  . Nutritional Supplements (GRAPESEED EXTRACT PO) Take 1 tablet by mouth daily.     . traMADol (ULTRAM) 50 MG tablet Take 1 tablet (50 mg total) by mouth every 6 (six) hours as needed. 20 tablet 0  . Turmeric 500 MG CAPS Take by mouth. Pt states taking 3000 MG    . aspirin 81 MG tablet Take 81 mg by mouth as needed.  (Patient not taking: Reported on 11/04/2019)     No current facility-administered medications on file prior to visit.   She is allergic to codeine..  Review of Systems Review of Systems  Constitutional: Negative for activity change, appetite change and fatigue.  HENT: Negative for hearing loss, congestion, tinnitus and ear discharge.  dentist q61m Eyes: Negative for visual disturbance (see optho q1y -- vision corrected to 20/20 with glasses).  Respiratory: Negative for cough, chest tightness and shortness of breath.   Cardiovascular: Negative for chest pain, palpitations and leg swelling.  Gastrointestinal: Negative for abdominal pain, diarrhea, constipation and abdominal distention.  Genitourinary: Negative for urgency, frequency, decreased urine volume and difficulty urinating.  Musculoskeletal: Negative for back pain, arthralgias and gait problem.  Skin: Negative for color change, pallor and rash.  Neurological: Negative for dizziness, light-headedness, numbness and headaches.  Hematological: Negative for adenopathy. Does not bruise/bleed easily.  Psychiatric/Behavioral: Negative for suicidal ideas, confusion, sleep  disturbance, self-injury, dysphoric mood, decreased concentration and agitation.       Objective:    BP 110/70 (BP Location: Left Arm, Patient Position: Sitting, Cuff Size: Normal)   Pulse 86   Temp 99.5 F (37.5 C) (Oral)   Resp 18   Ht 5\' 5"  (1.651 m)   Wt 175 lb 9.6 oz (79.7 kg)   SpO2 98%   BMI 29.22 kg/m  General appearance: alert, cooperative, appears stated age and no distress Head: Normocephalic, without obvious abnormality, atraumatic Eyes: negative findings: lids and lashes normal, conjunctivae and sclerae normal and pupils equal, round, reactive to light and accomodation Ears: normal TM's and external ear canals both ears Neck: no adenopathy, no carotid bruit, no JVD, supple, symmetrical, trachea midline and thyroid not enlarged, symmetric, no tenderness/mass/nodules Back: symmetric, no curvature. ROM normal. No CVA tenderness. Lungs: clear to auscultation bilaterally Breasts: gyn Heart: regular rate and rhythm, S1, S2 normal, no murmur, click, rub or gallop Abdomen: soft, non-tender; bowel sounds normal; no masses,  no organomegaly Pelvic: deferred--gyn  Extremities: extremities normal, atraumatic, no cyanosis or edema Pulses: 2+ and symmetric Skin: Skin color, texture, turgor normal. No rashes or lesions Lymph nodes: Cervical, supraclavicular, and axillary nodes normal. Neurologic: Alert and oriented X 3, normal strength and tone. Normal symmetric reflexes. Normal coordination and gait    Assessment:    Healthy female exam.      Plan:    ghm utd Check labs  See After Visit Summary for Counseling Recommendations    1. Bilateral edema of lower extremity Elevated extremities Compression socks Check labs  - furosemide (LASIX) 40 MG tablet; Take 1 tablet (40 mg total) by mouth daily.  Dispense: 90 tablet; Refill: 3 - Lipid panel - CBC with Differential/Platelet - Comprehensive metabolic panel  2. Need for influenza vaccination   - Flu Vaccine QUAD High  Dose(Fluad)  3. Preventative health care See above  - Lipid panel - CBC with Differential/Platelet - Comprehensive metabolic panel  4. Hyperlipidemia, unspecified hyperlipidemia type Tolerating statin, encouraged heart healthy diet, avoid trans fats, minimize simple carbs and saturated fats. Increase exercise as tolerated - Lipid panel - CBC with Differential/Platelet - Comprehensive metabolic panel  5. Family history of early CAD ekg normal  Check labs  - EKG 12-Lead  6. Need for pneumococcal vaccination  - Pneumococcal polysaccharide vaccine 23-valent greater than or equal to 2yo subcutaneous/IM

## 2019-11-04 NOTE — Patient Instructions (Signed)
Preventive Care 66 Years and Older, Female Preventive care refers to lifestyle choices and visits with your health care provider that can promote health and wellness. This includes:  A yearly physical exam. This is also called an annual well check.  Regular dental and eye exams.  Immunizations.  Screening for certain conditions.  Healthy lifestyle choices, such as diet and exercise. What can I expect for my preventive care visit? Physical exam Your health care provider will check:  Height and weight. These may be used to calculate body mass index (BMI), which is a measurement that tells if you are at a healthy weight.  Heart rate and blood pressure.  Your skin for abnormal spots. Counseling Your health care provider may ask you questions about:  Alcohol, tobacco, and drug use.  Emotional well-being.  Home and relationship well-being.  Sexual activity.  Eating habits.  History of falls.  Memory and ability to understand (cognition).  Work and work Statistician.  Pregnancy and menstrual history. What immunizations do I need?  Influenza (flu) vaccine  This is recommended every year. Tetanus, diphtheria, and pertussis (Tdap) vaccine  You may need a Td booster every 10 years. Varicella (chickenpox) vaccine  You may need this vaccine if you have not already been vaccinated. Zoster (shingles) vaccine  You may need this after age 33. Pneumococcal conjugate (PCV13) vaccine  One dose is recommended after age 33. Pneumococcal polysaccharide (PPSV23) vaccine  One dose is recommended after age 72. Measles, mumps, and rubella (MMR) vaccine  You may need at least one dose of MMR if you were born in 1957 or later. You may also need a second dose. Meningococcal conjugate (MenACWY) vaccine  You may need this if you have certain conditions. Hepatitis A vaccine  You may need this if you have certain conditions or if you travel or work in places where you may be exposed  to hepatitis A. Hepatitis B vaccine  You may need this if you have certain conditions or if you travel or work in places where you may be exposed to hepatitis B. Haemophilus influenzae type b (Hib) vaccine  You may need this if you have certain conditions. You may receive vaccines as individual doses or as more than one vaccine together in one shot (combination vaccines). Talk with your health care provider about the risks and benefits of combination vaccines. What tests do I need? Blood tests  Lipid and cholesterol levels. These may be checked every 5 years, or more frequently depending on your overall health.  Hepatitis C test.  Hepatitis B test. Screening  Lung cancer screening. You may have this screening every year starting at age 39 if you have a 30-pack-year history of smoking and currently smoke or have quit within the past 15 years.  Colorectal cancer screening. All adults should have this screening starting at age 36 and continuing until age 15. Your health care provider may recommend screening at age 23 if you are at increased risk. You will have tests every 1-10 years, depending on your results and the type of screening test.  Diabetes screening. This is done by checking your blood sugar (glucose) after you have not eaten for a while (fasting). You may have this done every 1-3 years.  Mammogram. This may be done every 1-2 years. Talk with your health care provider about how often you should have regular mammograms.  BRCA-related cancer screening. This may be done if you have a family history of breast, ovarian, tubal, or peritoneal cancers.  Other tests  Sexually transmitted disease (STD) testing.  Bone density scan. This is done to screen for osteoporosis. You may have this done starting at age 44. Follow these instructions at home: Eating and drinking  Eat a diet that includes fresh fruits and vegetables, whole grains, lean protein, and low-fat dairy products. Limit  your intake of foods with high amounts of sugar, saturated fats, and salt.  Take vitamin and mineral supplements as recommended by your health care provider.  Do not drink alcohol if your health care provider tells you not to drink.  If you drink alcohol: ? Limit how much you have to 0-1 drink a day. ? Be aware of how much alcohol is in your drink. In the [place] one drink equals one 12 oz bottle of beer (355 mL), one 5 oz glass of wine (148 mL), or one 1 oz glass of hard liquor (44 mL). Lifestyle  Take daily care of your teeth and gums.  Stay active. Exercise for at least 30 minutes on 5 or more days each week.  Do not use any products that contain nicotine or tobacco, such as cigarettes, e-cigarettes, and chewing tobacco. If you need help quitting, ask your health care provider.  If you are sexually active, practice safe sex. Use a condom or other form of protection in order to prevent STIs (sexually transmitted infections).  Talk with your health care provider about taking a low-dose aspirin or statin. What's next?  Go to your health care provider once a year for a well check visit.  Ask your health care provider how often you should have your eyes and teeth checked.  Stay up to date on all vaccines. This information is not intended to replace advice given to you by your health care provider. Make sure you discuss any questions you have with your health care provider. Document Revised: 12/24/2017 Document Reviewed: 12/24/2017 Elsevier Patient Education  2020 [employer].

## 2019-11-05 LAB — LIPID PANEL
Cholesterol: 204 mg/dL — ABNORMAL HIGH (ref <200)
HDL: 64 mg/dL (ref >=50)
LDL Cholesterol (Calc): 106 mg/dL (calc) — ABNORMAL HIGH
Non-HDL Cholesterol (Calc): 140 mg/dL (calc) — ABNORMAL HIGH (ref <130)
Total CHOL/HDL Ratio: 3.2 (calc) (ref <5.0)
Triglycerides: 215 mg/dL — ABNORMAL HIGH (ref <150)

## 2019-11-05 LAB — COMPREHENSIVE METABOLIC PANEL
AG Ratio: 1.6 (calc) (ref 1.0–2.5)
ALT: 26 U/L (ref 6–29)
AST: 14 U/L (ref 10–35)
Albumin: 4.2 g/dL (ref 3.6–5.1)
Alkaline phosphatase (APISO): 72 U/L (ref 37–153)
BUN: 22 mg/dL (ref 7–25)
CO2: 26 mmol/L (ref 20–32)
Calcium: 9.8 mg/dL (ref 8.6–10.4)
Chloride: 107 mmol/L (ref 98–110)
Creat: 0.94 mg/dL (ref 0.50–0.99)
Globulin: 2.7 g/dL (calc) (ref 1.9–3.7)
Glucose, Bld: 88 mg/dL (ref 65–99)
Potassium: 4 mmol/L (ref 3.5–5.3)
Sodium: 141 mmol/L (ref 135–146)
Total Bilirubin: 0.2 mg/dL (ref 0.2–1.2)
Total Protein: 6.9 g/dL (ref 6.1–8.1)

## 2019-11-05 LAB — CBC WITH DIFFERENTIAL/PLATELET
Absolute Monocytes: 549 cells/uL (ref 200–950)
Basophils Absolute: 47 cells/uL (ref 0–200)
Basophils Relative: 0.5 %
Eosinophils Absolute: 47 cells/uL (ref 15–500)
Eosinophils Relative: 0.5 %
HCT: 39.4 % (ref 35.0–45.0)
Hemoglobin: 13.2 g/dL (ref 11.7–15.5)
Lymphs Abs: 3311 cells/uL (ref 850–3900)
MCH: 32 pg (ref 27.0–33.0)
MCHC: 33.5 g/dL (ref 32.0–36.0)
MCV: 95.6 fL (ref 80.0–100.0)
MPV: 9.6 fL (ref 7.5–12.5)
Monocytes Relative: 5.9 %
Neutro Abs: 5348 cells/uL (ref 1500–7800)
Neutrophils Relative %: 57.5 %
Platelets: 310 10*3/uL (ref 140–400)
RBC: 4.12 10*6/uL (ref 3.80–5.10)
RDW: 13.1 % (ref 11.0–15.0)
Total Lymphocyte: 35.6 %
WBC: 9.3 10*3/uL (ref 3.8–10.8)

## 2019-11-08 ENCOUNTER — Ambulatory Visit (INDEPENDENT_AMBULATORY_CARE_PROVIDER_SITE_OTHER): Payer: Medicare Other | Admitting: Family Medicine

## 2019-11-08 ENCOUNTER — Ambulatory Visit: Payer: Medicare Other | Attending: Internal Medicine

## 2019-11-08 ENCOUNTER — Other Ambulatory Visit: Payer: Self-pay

## 2019-11-08 ENCOUNTER — Other Ambulatory Visit (HOSPITAL_BASED_OUTPATIENT_CLINIC_OR_DEPARTMENT_OTHER): Payer: Self-pay | Admitting: Internal Medicine

## 2019-11-08 ENCOUNTER — Encounter: Payer: Self-pay | Admitting: Family Medicine

## 2019-11-08 VITALS — BP 118/80 | HR 73 | Temp 99.4°F | Resp 18 | Ht 65.0 in | Wt 177.0 lb

## 2019-11-08 DIAGNOSIS — R413 Other amnesia: Secondary | ICD-10-CM

## 2019-11-08 DIAGNOSIS — Z23 Encounter for immunization: Secondary | ICD-10-CM

## 2019-11-08 NOTE — Progress Notes (Signed)
   Covid-[facility] Vaccination [facility]  Name:  ZAKYIA GAGAN    MRN: [id number] DOB: 05-07-1955  11/08/2019  Ms. Sanks was observed post Covid-19 immunization for 15 minutes without incident. She was provided with Vaccine Information Sheet and instruction to access the V-Safe system. Vaccinated by [employer].  Ms. Oldfield was instructed to call 911 with any severe reactions post vaccine: Marland Kitchen Difficulty breathing  . Swelling of face and throat  . A fast heartbeat  . A bad rash all over body  . Dizziness and weakness
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
## 2019-11-08 NOTE — Patient Instructions (Signed)

## 2019-11-09 LAB — TSH: TSH: 1.15 mIU/L (ref 0.40–4.50)

## 2019-11-09 LAB — RPR: RPR Ser Ql: NONREACTIVE

## 2019-11-09 LAB — VITAMIN B12: Vitamin B-12: 676 pg/mL (ref 200–1100)

## 2019-11-11 ENCOUNTER — Encounter: Payer: Self-pay | Admitting: Psychology

## 2019-11-24 ENCOUNTER — Ambulatory Visit: Payer: Medicare Other | Admitting: Vascular Surgery

## 2019-11-24 ENCOUNTER — Other Ambulatory Visit: Payer: Self-pay

## 2019-11-24 ENCOUNTER — Encounter: Payer: Self-pay | Admitting: Vascular Surgery

## 2019-11-24 VITALS — BP 106/77 | HR 78 | Temp 98.1°F | Resp 18 | Ht 64.25 in | Wt 174.9 lb

## 2019-11-24 DIAGNOSIS — I872 Venous insufficiency (chronic) (peripheral): Secondary | ICD-10-CM | POA: Diagnosis not present
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
## 2019-12-28 ENCOUNTER — Encounter: Payer: Self-pay | Admitting: Obstetrics & Gynecology

## 2019-12-28 ENCOUNTER — Telehealth: Payer: Self-pay | Admitting: Obstetrics & Gynecology

## 2019-12-28 DIAGNOSIS — Z780 Asymptomatic menopausal state: Secondary | ICD-10-CM

## 2019-12-28 DIAGNOSIS — M8580 Other specified disorders of bone density and structure, unspecified site: Secondary | ICD-10-CM

## 2019-12-28 HISTORY — DX: Other specified disorders of bone density and structure, unspecified site: M85.80

## 2019-12-28 HISTORY — DX: Asymptomatic menopausal state: Z78.0

## 2019-12-28 NOTE — Telephone Encounter (Signed)
TC to pt to review Dexa scan. Left message to call back.   Dr. Ihor Dow

## 2019-12-30 ENCOUNTER — Ambulatory Visit (INDEPENDENT_AMBULATORY_CARE_PROVIDER_SITE_OTHER): Payer: Medicare Other | Admitting: Psychology

## 2019-12-30 ENCOUNTER — Encounter: Payer: Self-pay | Admitting: Psychology

## 2019-12-30 ENCOUNTER — Other Ambulatory Visit: Payer: Self-pay

## 2019-12-30 ENCOUNTER — Encounter: Payer: Medicare Other | Admitting: Psychology

## 2019-12-30 ENCOUNTER — Ambulatory Visit: Payer: Medicare Other | Admitting: Psychology

## 2019-12-30 DIAGNOSIS — G43909 Migraine, unspecified, not intractable, without status migrainosus: Secondary | ICD-10-CM | POA: Insufficient documentation

## 2019-12-30 DIAGNOSIS — G3184 Mild cognitive impairment, so stated: Secondary | ICD-10-CM | POA: Diagnosis not present

## 2019-12-30 DIAGNOSIS — R4189 Other symptoms and signs involving cognitive functions and awareness: Secondary | ICD-10-CM

## 2019-12-30 NOTE — Progress Notes (Signed)
   Psychometrician Note   Cognitive testing was administered to [place] by Milana Kidney, B.S. (psychometrist) under the supervision of Dr. Christia Reading, Ph.D., licensed psychologist on 12/30/19. Ms. Kassa did not appear overtly distressed by the testing session per behavioral observation or responses across self-report questionnaires. Dr. Christia Reading, Ph.D. checked in with Ms. Nanez as needed to manage any distress related to testing procedures (if applicable). Rest breaks were offered.    The battery of tests administered was selected by Dr. Christia Reading, Ph.D. with consideration to Ms. Wrightsman's current level of functioning, the nature of her symptoms, emotional and behavioral responses during interview, level of literacy, observed level of motivation/effort, and the nature of the referral question. This battery was communicated to the psychometrist. Communication between Dr. Christia Reading, Ph.D. and the psychometrist was ongoing throughout the evaluation and Dr. Christia Reading, Ph.D. was immediately accessible at all times. Dr. Christia Reading, Ph.D. provided supervision to the psychometrist on the date of this service to the extent necessary to assure the quality of all services provided.    Nasrin Claudie Revering will return within approximately 1-2 weeks for an interactive feedback session with Dr. Melvyn Novas at which time her test performances, clinical impressions, and treatment recommendations will be reviewed in detail. Ms. Quinteros understands she can contact our office should she require our assistance before this time.  A total of 140 minutes of billable time were spent face-to-face with Ms. Lizardi by the psychometrist. This includes both test administration and scoring time. Billing for these services is reflected in the clinical report generated by Dr. Christia Reading, Ph.D..  This note reflects time spent with the psychometrician and does not include test scores or any clinical  interpretations made by Dr. Melvyn Novas. The full report will follow in a separate note.
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
## 2020-01-02 ENCOUNTER — Encounter: Payer: Self-pay | Admitting: Psychology

## 2020-01-02 DIAGNOSIS — G3184 Mild cognitive impairment, so stated: Secondary | ICD-10-CM

## 2020-01-02 HISTORY — DX: Mild cognitive impairment of uncertain or unknown etiology: G31.84

## 2020-01-09 ENCOUNTER — Ambulatory Visit (INDEPENDENT_AMBULATORY_CARE_PROVIDER_SITE_OTHER): Payer: Medicare Other | Admitting: Psychology

## 2020-01-09 ENCOUNTER — Other Ambulatory Visit: Payer: Self-pay

## 2020-01-09 DIAGNOSIS — G3184 Mild cognitive impairment, so stated: Secondary | ICD-10-CM

## 2020-01-09 NOTE — Patient Instructions (Signed)
A repeat neuropsychological evaluation in 18-24 months (or sooner if functional decline is noted) is recommended to assess the trajectory of future cognitive decline should it occur. This will also aid in future efforts towards improved diagnostic clarity.  A brain MRI had been ordered but not completed at the time of the current evaluation. I would recommend that Kimberly Escobar call and schedule this scan. This will allow her medical team to examine any potential anatomical correlates for cognitive dysfunction, as well as track potential changes over time.  Kimberly Escobar is encouraged to attend to lifestyle factors for brain health (e.g., regular physical exercise, good nutrition habits, regular participation in cognitively-stimulating activities, and general stress management techniques), which are likely to have benefits for both emotional adjustment and cognition. In fact, in addition to promoting good general health, regular exercise incorporating aerobic activities (e.g., brisk walking, jogging, cycling, etc.) has been demonstrated to be a very effective treatment for depression and stress, with similar efficacy rates to both antidepressant medication and psychotherapy. Optimal control of vascular risk factors (including safe cardiovascular exercise and adherence to dietary recommendations) is encouraged.   When learning new information, she would benefit from information being broken up into small, manageable pieces. She may also find it helpful to articulate the material in her own words and in a context to promote encoding at the onset of a new task. This material may need to be repeated multiple times to promote encoding.  Memory can be improved using internal strategies such as rehearsal, repetition, chunking, mnemonics, association, and imagery. External strategies such as written notes in a consistently used memory journal, visual and nonverbal auditory cues such as a calendar on the refrigerator or  appointments with alarm, such as on a cell phone, can also help maximize recall.    To address problems with executive dysfunction, she may wish to consider:   -Avoiding external distractions when needing to concentrate   -Limiting exposure to fast paced environments with multiple sensory demands   -Writing down complicated information and using checklists   -Attempting and completing one task at a time (i.e., no multi-tasking)   -Verbalizing aloud each step of a task to maintain focus   -Reducing the amount of information considered at one time

## 2020-01-09 NOTE — Progress Notes (Signed)
   Neuropsychology Feedback Session [name]. [facility] [place] Department of Neurology  Reason for Referral:   Kimberly Escobar a 64 y.o. right-handed African-American female referred by Seabron Spates, D.O.,to characterize hercurrent cognitive functioning and assist with diagnostic clarity and treatment planning in the context of subjective cognitive decline.   Feedback:   [name] completed a comprehensive neuropsychological evaluation on 12/30/2019. Please refer to that encounter for the full report and recommendations. Briefly, results suggested a noted weakness across learning and memory, as well as performance variability across executive functioning. The etiology for cognitive weaknesses is unclear. Specific to memory, she was able to learn novel information reasonably well. However, she exhibited deficits while attempting to retrieve this information from memory storage after lengthy delays. Performance on yes/no recognition trials was variable, suggesting some evidence for information consolidation. Retention percentages were also variable. Given current scores, I cannot rule out the presence of an underlying cognitive disorder and it is possible that deficits in retrieval/consolidation aspects of memory, coupled with executive dysfunction, represent the extremely early stages of a neurodegenerative condition. However, performance was strong across other assessed cognitive domains and there remains the possibility that these weaknesses may be influenced by acute anxiety/stress due to being testing, as well as her history of HSV or other medical conditions.  [name] was accompanied by her husband during the current feedback session. Content of the current session focused on the results of her neuropsychological evaluation. [name] and her husband were given the opportunity to ask questions and their questions were answered. They were encouraged to reach out should  additional questions arise. A copy of her report was provided at the conclusion of the visit.      22 minutes were spent conducting the current feedback session with [name], billed as one unit [phone].

## 2020-03-07 ENCOUNTER — Encounter: Payer: Self-pay | Admitting: Family Medicine

## 2020-03-07 ENCOUNTER — Ambulatory Visit (INDEPENDENT_AMBULATORY_CARE_PROVIDER_SITE_OTHER): Payer: Medicare Other | Admitting: Family Medicine

## 2020-03-07 ENCOUNTER — Other Ambulatory Visit: Payer: Self-pay

## 2020-03-07 VITALS — BP 108/62 | HR 85 | Temp 98.9°F | Ht 65.0 in | Wt 177.2 lb

## 2020-03-07 DIAGNOSIS — H6981 Other specified disorders of Eustachian tube, right ear: Secondary | ICD-10-CM | POA: Diagnosis not present

## 2020-03-07 MED ORDER — FLUTICASONE PROPIONATE 50 MCG/ACT NA SUSP: 2.0000 | Freq: Every day | NASAL | 2 refills | Status: DC

## 2020-03-07 MED ORDER — PREDNISONE 20 MG PO TABS: 40.0000 mg | ORAL_TABLET | Freq: Every day | ORAL | 0 refills | Status: AC

## 2020-03-07 NOTE — Patient Instructions (Signed)
OK to take Tylenol 1000 mg (2 extra strength tabs) or 975 mg (3 regular strength tabs) every 6 hours as needed.  Flonase (fluticasone); nasal spray that is over the counter. 2 sprays each nostril, once daily. Aim towards the same side eye when you spray.  There are available OTC, and the generic versions, which may be cheaper, are in parentheses. Show this to a pharmacist if you have trouble finding any of these items.  Let us know if you need anything.
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
## 2020-03-09 ENCOUNTER — Ambulatory Visit (INDEPENDENT_AMBULATORY_CARE_PROVIDER_SITE_OTHER): Payer: Medicare Other | Admitting: Family Medicine

## 2020-03-09 ENCOUNTER — Encounter: Payer: Self-pay | Admitting: Family Medicine

## 2020-03-09 ENCOUNTER — Other Ambulatory Visit: Payer: Self-pay

## 2020-03-09 VITALS — BP 110/60 | HR 72 | Temp 98.9°F | Resp 18 | Ht 65.0 in | Wt 175.4 lb

## 2020-03-09 DIAGNOSIS — E785 Hyperlipidemia, unspecified: Secondary | ICD-10-CM | POA: Diagnosis not present

## 2020-03-09 DIAGNOSIS — R413 Other amnesia: Secondary | ICD-10-CM

## 2020-03-09 DIAGNOSIS — I10 Essential (primary) hypertension: Secondary | ICD-10-CM

## 2020-03-09 LAB — COMPREHENSIVE METABOLIC PANEL
ALT: 20 U/L (ref 0–35)
AST: 13 U/L (ref 0–37)
Albumin: 4.3 g/dL (ref 3.5–5.2)
Alkaline Phosphatase: 76 U/L (ref 39–117)
BUN: 18 mg/dL (ref 6–23)
CO2: 30 mEq/L (ref 19–32)
Calcium: 10 mg/dL (ref 8.4–10.5)
Chloride: 106 mEq/L (ref 96–112)
Creatinine, Ser: 0.8 mg/dL (ref 0.40–1.20)
GFR: 76.75 mL/min (ref >60.00)
Glucose, Bld: 85 mg/dL (ref 70–99)
Potassium: 3.9 mEq/L (ref 3.5–5.1)
Sodium: 142 mEq/L (ref 135–145)
Total Bilirubin: 0.4 mg/dL (ref 0.2–1.2)
Total Protein: 7.3 g/dL (ref 6.0–8.3)

## 2020-03-09 LAB — LIPID PANEL
Cholesterol: 202 mg/dL — ABNORMAL HIGH (ref 0–200)
HDL: 62.7 mg/dL (ref >39.00)
LDL Cholesterol: 121 mg/dL — ABNORMAL HIGH (ref 0–99)
NonHDL: 139.12
Total CHOL/HDL Ratio: 3
Triglycerides: 92 mg/dL (ref 0.0–149.0)
VLDL: 18.4 mg/dL (ref 0.0–40.0)
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
## 2020-03-09 NOTE — Patient Instructions (Signed)

## 2020-03-09 NOTE — Assessment & Plan Note (Signed)
Neuro psych notes reviewed  Mri to be scheduled  F/u neuro psych 18-24 months

## 2020-03-09 NOTE — Assessment & Plan Note (Signed)
Encouraged heart healthy diet, increase exercise, avoid trans fats, consider a krill oil cap daily 

## 2020-03-09 NOTE — Assessment & Plan Note (Signed)
Well controlled, no changes to meds. Encouraged heart healthy diet such as the DASH diet and exercise as tolerated.  °

## 2020-03-10 ENCOUNTER — Ambulatory Visit (HOSPITAL_BASED_OUTPATIENT_CLINIC_OR_DEPARTMENT_OTHER)
Admission: RE | Admit: 2020-03-10 | Discharge: 2020-03-10 | Disposition: A | Discharge status: Home / Self Care | Payer: Medicare Other | Source: Ambulatory Visit | Attending: Family Medicine | Admitting: Family Medicine

## 2020-03-10 DIAGNOSIS — R413 Other amnesia: Secondary | ICD-10-CM | POA: Insufficient documentation

## 2020-03-22 ENCOUNTER — Ambulatory Visit: Payer: Medicare Other
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
## 2020-04-18 ENCOUNTER — Ambulatory Visit: Payer: Medicare Other | Admitting: Rheumatology

## 2020-04-18 ENCOUNTER — Encounter: Payer: Self-pay | Admitting: Rheumatology

## 2020-04-18 ENCOUNTER — Other Ambulatory Visit: Payer: Self-pay

## 2020-04-18 ENCOUNTER — Telehealth: Payer: Self-pay | Admitting: *Deleted

## 2020-04-18 VITALS — BP 125/80 | HR 76 | Resp 13 | Ht 65.0 in | Wt 179.2 lb

## 2020-04-18 DIAGNOSIS — R768 Other specified abnormal immunological findings in serum: Secondary | ICD-10-CM | POA: Diagnosis not present

## 2020-04-18 DIAGNOSIS — L819 Disorder of pigmentation, unspecified: Secondary | ICD-10-CM

## 2020-04-18 DIAGNOSIS — M533 Sacrococcygeal disorders, not elsewhere classified: Secondary | ICD-10-CM

## 2020-04-18 DIAGNOSIS — M8589 Other specified disorders of bone density and structure, multiple sites: Secondary | ICD-10-CM

## 2020-04-18 DIAGNOSIS — M25512 Pain in left shoulder: Secondary | ICD-10-CM

## 2020-04-18 DIAGNOSIS — G4709 Other insomnia: Secondary | ICD-10-CM | POA: Diagnosis not present

## 2020-04-18 DIAGNOSIS — M25511 Pain in right shoulder: Secondary | ICD-10-CM | POA: Diagnosis not present

## 2020-04-18 DIAGNOSIS — Z9889 Other specified postprocedural states: Secondary | ICD-10-CM | POA: Diagnosis not present

## 2020-04-18 DIAGNOSIS — Z8719 Personal history of other diseases of the digestive system: Secondary | ICD-10-CM

## 2020-04-18 DIAGNOSIS — M19041 Primary osteoarthritis, right hand: Secondary | ICD-10-CM | POA: Diagnosis not present

## 2020-04-18 DIAGNOSIS — M170 Bilateral primary osteoarthritis of knee: Secondary | ICD-10-CM | POA: Diagnosis not present

## 2020-04-18 DIAGNOSIS — M19042 Primary osteoarthritis, left hand: Secondary | ICD-10-CM

## 2020-04-18 DIAGNOSIS — G8929 Other chronic pain: Secondary | ICD-10-CM

## 2020-04-18 DIAGNOSIS — Z8669 Personal history of other diseases of the nervous system and sense organs: Secondary | ICD-10-CM | POA: Diagnosis not present

## 2020-04-18 DIAGNOSIS — E785 Hyperlipidemia, unspecified: Secondary | ICD-10-CM

## 2020-04-18 DIAGNOSIS — M7062 Trochanteric bursitis, left hip: Secondary | ICD-10-CM | POA: Diagnosis not present

## 2020-04-18 NOTE — Patient Instructions (Signed)
Journal for Nurse Practitioners, 15(4), 263-267. Retrieved October 19, 2017 from [email or web address].com/[email or web address]">  Knee Exercises Ask your health care provider which exercises are safe for you. Do exercises exactly as told by your health care provider and adjust them as directed. It is normal to feel mild stretching, pulling, tightness, or discomfort as you do these exercises. Stop right away if you feel sudden pain or your pain gets worse. Do not begin these exercises until told by your health care provider. Stretching and range-of-motion exercises These exercises warm up your muscles and joints and improve the movement and flexibility of your knee. These exercises also help to relieve pain and swelling. Knee extension, prone 1. Lie on your abdomen (prone position) on a bed. 2. Place your left / right knee just beyond the edge of the surface so your knee is not on the bed. You can put a towel under your left / right thigh just above your kneecap for comfort. 3. Relax your leg muscles and allow gravity to straighten your knee (extension). You should feel a stretch behind your left / right knee. 4. Hold this position for __________ seconds. 5. Scoot up so your knee is supported between repetitions. Repeat __________ times. Complete this exercise __________ times a day. Knee flexion, active 1. Lie on your back with both legs straight. If this causes back discomfort, bend your left / right knee so your foot is flat on the floor. 2. Slowly slide your left / right heel back toward your buttocks. Stop when you feel a gentle stretch in the front of your knee or thigh (flexion). 3. Hold this position for __________ seconds. 4. Slowly slide your left / right heel back to the starting position. Repeat __________ times. Complete this exercise __________ times a day.   Quadriceps stretch, prone 1. Lie on your abdomen on a firm surface, such as a bed or padded floor. 2. Bend your left / right knee and hold  your ankle. If you cannot reach your ankle or pant leg, loop a belt around your foot and grab the belt instead. 3. Gently pull your heel toward your buttocks. Your knee should not slide out to the side. You should feel a stretch in the front of your thigh and knee (quadriceps). 4. Hold this position for __________ seconds. Repeat __________ times. Complete this exercise __________ times a day.   Hamstring, supine 1. Lie on your back (supine position). 2. Loop a belt or towel over the ball of your left / right foot. The ball of your foot is on the walking surface, right under your toes. 3. Straighten your left / right knee and slowly pull on the belt to raise your leg until you feel a gentle stretch behind your knee (hamstring). ? Do not let your knee bend while you do this. ? Keep your other leg flat on the floor. 4. Hold this position for __________ seconds. Repeat __________ times. Complete this exercise __________ times a day. Strengthening exercises These exercises build strength and endurance in your knee. Endurance is the ability to use your muscles for a long time, even after they get tired. Quadriceps, isometric This exercise stretches the muscles in front of your thigh (quadriceps) without moving your knee joint (isometric). 1. Lie on your back with your left / right leg extended and your other knee bent. Put a rolled towel or small pillow under your knee if told by your health care provider. 2. Slowly tense the muscles in the front of your   left / right thigh. You should see your kneecap slide up toward your hip or see increased dimpling just above the knee. This motion will push the back of the knee toward the floor. 3. For __________ seconds, hold the muscle as tight as you can without increasing your pain. 4. Relax the muscles slowly and completely. Repeat __________ times. Complete this exercise __________ times a day.   Straight leg raises This exercise stretches the muscles in  front of your thigh (quadriceps) and the muscles that move your hips (hip flexors). 1. Lie on your back with your left / right leg extended and your other knee bent. 2. Tense the muscles in the front of your left / right thigh. You should see your kneecap slide up or see increased dimpling just above the knee. Your thigh may even shake a bit. 3. Keep these muscles tight as you raise your leg 4-6 inches (10-15 cm) off the floor. Do not let your knee bend. 4. Hold this position for __________ seconds. 5. Keep these muscles tense as you lower your leg. 6. Relax your muscles slowly and completely after each repetition. Repeat __________ times. Complete this exercise __________ times a day. Hamstring, isometric 1. Lie on your back on a firm surface. 2. Bend your left / right knee about __________ degrees. 3. Dig your left / right heel into the surface as if you are trying to pull it toward your buttocks. Tighten the muscles in the back of your thighs (hamstring) to "dig" as hard as you can without increasing any pain. 4. Hold this position for __________ seconds. 5. Release the tension gradually and allow your muscles to relax completely for __________ seconds after each repetition. Repeat __________ times. Complete this exercise __________ times a day. Hamstring curls If told by your health care provider, do this exercise while wearing ankle weights. Begin with __________ lb weights. Then increase the weight by 1 lb (0.5 kg) increments. Do not wear ankle weights that are more than __________ lb. 1. Lie on your abdomen with your legs straight. 2. Bend your left / right knee as far as you can without feeling pain. Keep your hips flat against the floor. 3. Hold this position for __________ seconds. 4. Slowly lower your leg to the starting position. Repeat __________ times. Complete this exercise __________ times a day.   Squats This exercise strengthens the muscles in front of your thigh and knee  (quadriceps). 1. Stand in front of a table, with your feet and knees pointing straight ahead. You may rest your hands on the table for balance but not for support. 2. Slowly bend your knees and lower your hips like you are going to sit in a chair. ? Keep your weight over your heels, not over your toes. ? Keep your lower legs upright so they are parallel with the table legs. ? Do not let your hips go lower than your knees. ? Do not bend lower than told by your health care provider. ? If your knee pain increases, do not bend as low. 3. Hold the squat position for __________ seconds. 4. Slowly push with your legs to return to standing. Do not use your hands to pull yourself to standing. Repeat __________ times. Complete this exercise __________ times a day. Wall slides This exercise strengthens the muscles in front of your thigh and knee (quadriceps). 1. Lean your back against a smooth wall or door, and walk your feet out 18-24 inches (46-61 cm) from it. 2.   Place your feet hip-width apart. 3. Slowly slide down the wall or door until your knees bend __________ degrees. Keep your knees over your heels, not over your toes. Keep your knees in line with your hips. 4. Hold this position for __________ seconds. Repeat __________ times. Complete this exercise __________ times a day.   Straight leg raises This exercise strengthens the muscles that rotate the leg at the hip and move it away from your body (hip abductors). 1. Lie on your side with your left / right leg in the top position. Lie so your head, shoulder, knee, and hip line up. You may bend your bottom knee to help you keep your balance. 2. Roll your hips slightly forward so your hips are stacked directly over each other and your left / right knee is facing forward. 3. Leading with your heel, lift your top leg 4-6 inches (10-15 cm). You should feel the muscles in your outer hip lifting. ? Do not let your foot drift forward. ? Do not let your  knee roll toward the ceiling. 4. Hold this position for __________ seconds. 5. Slowly return your leg to the starting position. 6. Let your muscles relax completely after each repetition. Repeat __________ times. Complete this exercise __________ times a day.   Straight leg raises This exercise stretches the muscles that move your hips away from the front of the pelvis (hip extensors). 1. Lie on your abdomen on a firm surface. You can put a pillow under your hips if that is more comfortable. 2. Tense the muscles in your buttocks and lift your left / right leg about 4-6 inches (10-15 cm). Keep your knee straight as you lift your leg. 3. Hold this position for __________ seconds. 4. Slowly lower your leg to the starting position. 5. Let your leg relax completely after each repetition. Repeat __________ times. Complete this exercise __________ times a day. This information is not intended to replace advice given to you by your health care provider. Make sure you discuss any questions you have with your health care provider. Document Revised: 10/20/2017 Document Reviewed: 10/20/2017 Elsevier Patient Education  2021 [employer]. Shoulder Exercises Ask your health care provider which exercises are safe for you. Do exercises exactly as told by your health care provider and adjust them as directed. It is normal to feel mild stretching, pulling, tightness, or discomfort as you do these exercises. Stop right away if you feel sudden pain or your pain gets worse. Do not begin these exercises until told by your health care provider. Stretching exercises External rotation and abduction This exercise is sometimes called corner stretch. This exercise rotates your arm outward (external rotation) and moves your arm out from your body (abduction). 6. Stand in a doorway with one of your feet slightly in front of the other. This is called a staggered stance. If you cannot reach your forearms to the door frame,  stand facing a corner of a room. 7. Choose one of the following positions as told by your health care provider: ? Place your hands and forearms on the door frame above your head. ? Place your hands and forearms on the door frame at the height of your head. ? Place your hands on the door frame at the height of your elbows. 8. Slowly move your weight onto your front foot until you feel a stretch across your chest and in the front of your shoulders. Keep your head and chest upright and keep your abdominal muscles tight.  9. Hold for __________ seconds. 10. To release the stretch, shift your weight to your back foot. Repeat __________ times. Complete this exercise __________ times a day.   Extension, standing 5. Stand and hold a broomstick, a cane, or a similar object behind your back. ? Your hands should be a little wider than shoulder width apart. ? Your palms should face away from your back. 6. Keeping your elbows straight and your shoulder muscles relaxed, move the stick away from your body until you feel a stretch in your shoulders (extension). ? Avoid shrugging your shoulders while you move the stick. Keep your shoulder blades tucked down toward the middle of your back. 7. Hold for __________ seconds. 8. Slowly return to the starting position. Repeat __________ times. Complete this exercise __________ times a day. Range-of-motion exercises Pendulum 5. Stand near a wall or a surface that you can hold onto for balance. 6. Bend at the waist and let your left / right arm hang straight down. Use your other arm to support you. Keep your back straight and do not lock your knees. 7. Relax your left / right arm and shoulder muscles, and move your hips and your trunk so your left / right arm swings freely. Your arm should swing because of the motion of your body, not because you are using your arm or shoulder muscles. 8. Keep moving your hips and trunk so your arm swings in the following directions, as  told by your health care provider: ? Side to side. ? Forward and backward. ? In clockwise and counterclockwise circles. 9. Continue each motion for __________ seconds, or for as long as told by your health care provider. 10. Slowly return to the starting position. Repeat __________ times. Complete this exercise __________ times a day.   Shoulder flexion, standing 5. Stand and hold a broomstick, a cane, or a similar object. Place your hands a little more than shoulder width apart on the object. Your left / right hand should be palm up, and your other hand should be palm down. 6. Keep your elbow straight and your shoulder muscles relaxed. Push the stick up with your healthy arm to raise your left / right arm in front of your body, and then over your head until you feel a stretch in your shoulder (flexion). ? Avoid shrugging your shoulder while you raise your arm. Keep your shoulder blade tucked down toward the middle of your back. 7. Hold for __________ seconds. 8. Slowly return to the starting position. Repeat __________ times. Complete this exercise __________ times a day.   Shoulder abduction, standing 5. Stand and hold a broomstick, a cane, or a similar object. Place your hands a little more than shoulder width apart on the object. Your left / right hand should be palm up, and your other hand should be palm down. 6. Keep your elbow straight and your shoulder muscles relaxed. Push the object across your body toward your left / right side. Raise your left / right arm to the side of your body (abduction) until you feel a stretch in your shoulder. ? Do not raise your arm above shoulder height unless your health care provider tells you to do that. ? If directed, raise your arm over your head. ? Avoid shrugging your shoulder while you raise your arm. Keep your shoulder blade tucked down toward the middle of your back. 7. Hold for __________ seconds. 8. Slowly return to the starting position. Repeat  __________ times. Complete this exercise __________  times a day. Internal rotation 7. Place your left / right hand behind your back, palm up. 8. Use your other hand to dangle an exercise band, a towel, or a similar object over your shoulder. Grasp the band with your left / right hand so you are holding on to both ends. 9. Gently pull up on the band until you feel a stretch in the front of your left / right shoulder. The movement of your arm toward the center of your body is called internal rotation. ? Avoid shrugging your shoulder while you raise your arm. Keep your shoulder blade tucked down toward the middle of your back. 10. Hold for __________ seconds. 11. Release the stretch by letting go of the band and lowering your hands. Repeat __________ times. Complete this exercise __________ times a day.   Strengthening exercises External rotation 6. Sit in a stable chair without armrests. 7. Secure an exercise band to a stable object at elbow height on your left / right side. 8. Place a soft object, such as a folded towel or a small pillow, between your left / right upper arm and your body to move your elbow about 4 inches (10 cm) away from your side. 9. Hold the end of the exercise band so it is tight and there is no slack. 10. Keeping your elbow pressed against the soft object, slowly move your forearm out, away from your abdomen (external rotation). Keep your body steady so only your forearm moves. 11. Hold for __________ seconds. 12. Slowly return to the starting position. Repeat __________ times. Complete this exercise __________ times a day.   Shoulder abduction 5. Sit in a stable chair without armrests, or stand up. 6. Hold a __________ weight in your left / right hand, or hold an exercise band with both hands. 7. Start with your arms straight down and your left / right palm facing in, toward your body. 8. Slowly lift your left / right hand out to your side (abduction). Do not lift your  hand above shoulder height unless your health care provider tells you that this is safe. ? Keep your arms straight. ? Avoid shrugging your shoulder while you do this movement. Keep your shoulder blade tucked down toward the middle of your back. 9. Hold for __________ seconds. 10. Slowly lower your arm, and return to the starting position. Repeat __________ times. Complete this exercise __________ times a day.   Shoulder extension 5. Sit in a stable chair without armrests, or stand up. 6. Secure an exercise band to a stable object in front of you so it is at shoulder height. 7. Hold one end of the exercise band in each hand. Your palms should face each other. 8. Straighten your elbows and lift your hands up to shoulder height. 9. Step back, away from the secured end of the exercise band, until the band is tight and there is no slack. 10. Squeeze your shoulder blades together as you pull your hands down to the sides of your thighs (extension). Stop when your hands are straight down by your sides. Do not let your hands go behind your body. 11. Hold for __________ seconds. 12. Slowly return to the starting position. Repeat __________ times. Complete this exercise __________ times a day. Shoulder row 5. Sit in a stable chair without armrests, or stand up. 6. Secure an exercise band to a stable object in front of you so it is at waist height. 7. Hold one end of the exercise band  in each hand. Position your palms so that your thumbs are facing the ceiling (neutral position). 8. Bend each of your elbows to a 90-degree angle (right angle) and keep your upper arms at your sides. 9. Step back until the band is tight and there is no slack. 10. Slowly pull your elbows back behind you. 11. Hold for __________ seconds. 12. Slowly return to the starting position. Repeat __________ times. Complete this exercise __________ times a day. Shoulder press-ups 7. Sit in a stable chair that has armrests. Sit  upright, with your feet flat on the floor. 8. Put your hands on the armrests so your elbows are bent and your fingers are pointing forward. Your hands should be about even with the sides of your body. 9. Push down on the armrests and use your arms to lift yourself off the chair. Straighten your elbows and lift yourself up as much as you comfortably can. ? Move your shoulder blades down, and avoid letting your shoulders move up toward your ears. ? Keep your feet on the ground. As you get stronger, your feet should support less of your body weight as you lift yourself up. 10. Hold for __________ seconds. 11. Slowly lower yourself back into the chair. Repeat __________ times. Complete this exercise __________ times a day.   Wall push-ups 6. Stand so you are facing a stable wall. Your feet should be about one arm-length away from the wall. 7. Lean forward and place your palms on the wall at shoulder height. 8. Keep your feet flat on the floor as you bend your elbows and lean forward toward the wall. 9. Hold for __________ seconds. 10. Straighten your elbows to push yourself back to the starting position. Repeat __________ times. Complete this exercise __________ times a day.   This information is not intended to replace advice given to you by your health care provider. Make sure you discuss any questions you have with your health care provider. Document Revised: 04/23/2018 Document Reviewed: 01/29/2018 Elsevier Patient Education  2021 [employer].

## 2020-04-18 NOTE — Telephone Encounter (Signed)
Please apply for Bilateral visco, schedule with Dr. Estanislado Pandy only. Thank you.

## 2020-04-19 ENCOUNTER — Ambulatory Visit: Payer: Medicare Other | Admitting: Rheumatology

## 2020-04-19 DIAGNOSIS — G4709 Other insomnia: Secondary | ICD-10-CM

## 2020-04-19 DIAGNOSIS — M170 Bilateral primary osteoarthritis of knee: Secondary | ICD-10-CM

## 2020-04-19 DIAGNOSIS — Z8719 Personal history of other diseases of the digestive system: Secondary | ICD-10-CM

## 2020-04-19 DIAGNOSIS — Z8669 Personal history of other diseases of the nervous system and sense organs: Secondary | ICD-10-CM

## 2020-04-19 DIAGNOSIS — M7062 Trochanteric bursitis, left hip: Secondary | ICD-10-CM

## 2020-04-19 DIAGNOSIS — E785 Hyperlipidemia, unspecified: Secondary | ICD-10-CM

## 2020-04-19 DIAGNOSIS — Z9889 Other specified postprocedural states: Secondary | ICD-10-CM

## 2020-04-19 DIAGNOSIS — R768 Other specified abnormal immunological findings in serum: Secondary | ICD-10-CM

## 2020-04-19 DIAGNOSIS — G8929 Other chronic pain: Secondary | ICD-10-CM

## 2020-04-19 DIAGNOSIS — L819 Disorder of pigmentation, unspecified: Secondary | ICD-10-CM

## 2020-04-19 DIAGNOSIS — M19042 Primary osteoarthritis, left hand: Secondary | ICD-10-CM

## 2020-04-19 DIAGNOSIS — M8589 Other specified disorders of bone density and structure, multiple sites: Secondary | ICD-10-CM

## 2020-04-20 NOTE — Telephone Encounter (Signed)
Submitted for VOB on 04/20/2020.

## 2020-04-24 ENCOUNTER — Other Ambulatory Visit: Payer: Self-pay

## 2020-04-24 DIAGNOSIS — Z8619 Personal history of other infectious and parasitic diseases: Secondary | ICD-10-CM

## 2020-04-24 MED ORDER — ACYCLOVIR 400 MG PO TABS: 400.0000 mg | ORAL_TABLET | ORAL | 5 refills | Status: DC | PRN

## 2020-04-24 NOTE — Telephone Encounter (Signed)
Please call to schedule Visco Knee injections.  Authorized for Synvisc series Bilateral Knees. Buy and Bill. Deductible does not apply. PA is not required. Insurance to cover 100% of allowable cost of injection with a $15.00 copay, and 80% of allowable cost of medication.

## 2020-04-26 ENCOUNTER — Ambulatory Visit: Payer: Medicare Other

## 2020-04-30 ENCOUNTER — Telehealth: Payer: Self-pay

## 2020-04-30 NOTE — Telephone Encounter (Signed)
Patient requested to talk to you about Synvisc injection cost before scheduling the appointments.  I told patient she would be responsible for a $15.00 co-pay and 80% of allowable cost of medication.

## 2020-05-01 NOTE — Telephone Encounter (Signed)
I spoke with patient, and advised her of her benefits for Visco. Patient decided to schedule her appointments.

## 2020-05-03 ENCOUNTER — Ambulatory Visit: Payer: Medicare Other | Admitting: Rheumatology

## 2020-05-03 ENCOUNTER — Other Ambulatory Visit: Payer: Self-pay

## 2020-05-03 DIAGNOSIS — M170 Bilateral primary osteoarthritis of knee: Secondary | ICD-10-CM | POA: Diagnosis not present

## 2020-05-03 MED ORDER — LIDOCAINE HCL 1 % IJ SOLN
1.5000 mL | INTRAMUSCULAR | Status: AC | PRN
  Administered 2020-05-03: 1.5 mL

## 2020-05-03 MED ORDER — HYLAN G-F 20 16 MG/2ML IX SOSY
16.0000 mg | PREFILLED_SYRINGE | INTRA_ARTICULAR | Status: AC | PRN
  Administered 2020-05-03: 16 mg via INTRA_ARTICULAR

## 2020-05-03 NOTE — Progress Notes (Signed)
   Procedure Note  Patient: Kimberly Escobar             Date of Birth: 1955/12/25           MRN: [id number]             Visit Date: 05/03/2020  Procedures: Visit Diagnoses:  1. Primary osteoarthritis of both knees      synvisc #1 bilateral knees, B/B  Large Joint Inj: bilateral knee on 05/03/2020 11:22 AM Indications: pain Details: 25 G 1.5 in needle, medial approach  Arthrogram: No  Medications (Right): 1.5 mL lidocaine 1 %; 16 mg Hylan 16 MG/2ML Aspirate (Right): 0 mL Medications (Left): 1.5 mL lidocaine 1 %; 16 mg Hylan 16 MG/2ML Aspirate (Left): 0 mL Outcome: tolerated well, no immediate complications Procedure, treatment alternatives, risks and benefits explained, specific risks discussed. Consent was given by the patient. Immediately prior to procedure a time out was called to verify the correct patient, procedure, equipment, support staff and site/side marked as required. Patient was prepped and draped in the usual sterile fashion.     Bo Merino, MD

## 2020-05-04 DIAGNOSIS — H10023 Other mucopurulent conjunctivitis, bilateral: Secondary | ICD-10-CM | POA: Diagnosis not present

## 2020-05-04 DIAGNOSIS — H04553 Acquired stenosis of bilateral nasolacrimal duct: Secondary | ICD-10-CM | POA: Diagnosis not present

## 2020-05-10 ENCOUNTER — Other Ambulatory Visit: Payer: Self-pay

## 2020-05-10 ENCOUNTER — Ambulatory Visit: Payer: Medicare Other | Admitting: Rheumatology

## 2020-05-10 DIAGNOSIS — M170 Bilateral primary osteoarthritis of knee: Secondary | ICD-10-CM | POA: Diagnosis not present

## 2020-05-10 MED ORDER — HYLAN G-F 20 16 MG/2ML IX SOSY
16.0000 mg | PREFILLED_SYRINGE | INTRA_ARTICULAR | Status: AC | PRN
  Administered 2020-05-10: 16 mg via INTRA_ARTICULAR

## 2020-05-10 MED ORDER — LIDOCAINE HCL 1 % IJ SOLN
1.5000 mL | INTRAMUSCULAR | Status: AC | PRN
  Administered 2020-05-10: 1.5 mL

## 2020-05-10 NOTE — Progress Notes (Signed)
   Procedure Note  Patient: Kimberly Escobar             Date of Birth: 26-Aug-1955           MRN: [id number]             Visit Date: 05/10/2020  Procedures: Visit Diagnoses:  1. Primary osteoarthritis of both knees     Synvisc #2 bilateral knees, B/B Large Joint Inj: bilateral knee on 05/10/2020 11:13 AM Indications: pain Details: 25 G 1.5 in needle, medial approach  Arthrogram: No  Medications (Right): 1.5 mL lidocaine 1 %; 16 mg Hylan 16 MG/2ML Aspirate (Right): 0 mL Medications (Left): 1.5 mL lidocaine 1 %; 16 mg Hylan 16 MG/2ML Aspirate (Left): 0 mL Outcome: tolerated well, no immediate complications Procedure, treatment alternatives, risks and benefits explained, specific risks discussed. Consent was given by the patient. Immediately prior to procedure a time out was called to verify the correct patient, procedure, equipment, support staff and site/side marked as required. Patient was prepped and draped in the usual sterile fashion.     Patient tolerated the procedure well.  Postprocedure instructions were given.  Bo Merino, MD

## 2020-05-17 ENCOUNTER — Other Ambulatory Visit: Payer: Self-pay

## 2020-05-17 ENCOUNTER — Ambulatory Visit: Payer: Medicare Other | Admitting: Rheumatology

## 2020-05-17 DIAGNOSIS — M170 Bilateral primary osteoarthritis of knee: Secondary | ICD-10-CM | POA: Diagnosis not present

## 2020-05-17 MED ORDER — HYLAN G-F 20 16 MG/2ML IX SOSY
16.0000 mg | PREFILLED_SYRINGE | INTRA_ARTICULAR | Status: AC | PRN
  Administered 2020-05-17: 16 mg via INTRA_ARTICULAR

## 2020-05-17 MED ORDER — LIDOCAINE HCL 1 % IJ SOLN
1.5000 mL | INTRAMUSCULAR | Status: AC | PRN
  Administered 2020-05-17: 1.5 mL

## 2020-05-17 NOTE — Progress Notes (Signed)
   Procedure Note  Patient: Kimberly Escobar             Date of Birth: 1956-01-12           MRN: [id number]             Visit Date: 05/17/2020  Procedures: Visit Diagnoses:  1. Primary osteoarthritis of both knees    Synvisc #3 Bilateral knee joint injections  Large Joint Inj: bilateral knee on 05/17/2020 9:47 AM Indications: pain Details: 25 G 1.5 in needle, medial approach  Arthrogram: No  Medications (Right): 1.5 mL lidocaine 1 %; 16 mg Hylan 16 MG/2ML Aspirate (Right): 0 mL Medications (Left): 1.5 mL lidocaine 1 %; 16 mg Hylan 16 MG/2ML Aspirate (Left): 0 mL Outcome: tolerated well, no immediate complications Procedure, treatment alternatives, risks and benefits explained, specific risks discussed. Consent was given by the patient. Immediately prior to procedure a time out was called to verify the correct patient, procedure, equipment, support staff and site/side marked as required. Patient was prepped and draped in the usual sterile fashion.     Patient tolerated the procedure well.  Aftercare was discussed.   Bo Merino, MD

## 2020-05-29 ENCOUNTER — Encounter: Payer: Self-pay | Admitting: Family

## 2020-05-29 ENCOUNTER — Ambulatory Visit (INDEPENDENT_AMBULATORY_CARE_PROVIDER_SITE_OTHER): Payer: Medicare Other | Admitting: Family

## 2020-05-29 ENCOUNTER — Ambulatory Visit: Payer: Medicare Other | Attending: Internal Medicine

## 2020-05-29 ENCOUNTER — Other Ambulatory Visit: Payer: Self-pay

## 2020-05-29 VITALS — BP 110/78 | HR 77 | Temp 97.2°F | Resp 17 | Ht 65.0 in | Wt 179.0 lb

## 2020-05-29 DIAGNOSIS — L989 Disorder of the skin and subcutaneous tissue, unspecified: Secondary | ICD-10-CM

## 2020-05-29 DIAGNOSIS — M797 Fibromyalgia: Secondary | ICD-10-CM

## 2020-05-29 DIAGNOSIS — Z23 Encounter for immunization: Secondary | ICD-10-CM

## 2020-05-29 MED ORDER — GABAPENTIN 100 MG PO CAPS: 100.0000 mg | ORAL_CAPSULE | Freq: Every day | ORAL | 0 refills | Status: DC

## 2020-05-29 NOTE — Progress Notes (Signed)
   Covid-19 Vaccination Clinic  Name:  Kimberly Escobar    MRN: [id number] DOB: Jul 11, 1955  05/29/2020  [name] was observed post Covid-19 immunization for 15 minutes without incident. She was provided with Vaccine Information Sheet and instruction to access the V-Safe system.   [name] was instructed to call 911 with any severe reactions post vaccine: Marland Kitchen Difficulty breathing  . Swelling of face and throat  . A fast heartbeat  . A bad rash all over body  . Dizziness and weakness   Immunizations Administered    Name Date Dose VIS Date Route   Moderna Covid-19 Booster Vaccine 05/29/2020  2:21 PM 0.25 mL 11/02/2019 Intramuscular   Manufacturer: Moderna   Lot: [id number]   [place]: [id number]
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
## 2020-05-30 ENCOUNTER — Telehealth: Payer: Self-pay | Admitting: Rheumatology

## 2020-05-30 NOTE — Telephone Encounter (Signed)
Patient still having a lot of pain in her knees since [place] injections. She did not notice any relief at all. Patient would like to know if there is something else that can be done? Please call to advise.
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
## 2020-05-31 ENCOUNTER — Ambulatory Visit (INDEPENDENT_AMBULATORY_CARE_PROVIDER_SITE_OTHER): Payer: Medicare Other

## 2020-05-31 ENCOUNTER — Other Ambulatory Visit: Payer: Self-pay

## 2020-05-31 ENCOUNTER — Other Ambulatory Visit (HOSPITAL_BASED_OUTPATIENT_CLINIC_OR_DEPARTMENT_OTHER): Payer: Self-pay

## 2020-05-31 ENCOUNTER — Other Ambulatory Visit: Payer: Self-pay | Admitting: Family Medicine

## 2020-05-31 ENCOUNTER — Telehealth: Payer: Self-pay

## 2020-05-31 VITALS — BP 128/80 | HR 78 | Temp 98.0°F | Resp 16 | Ht 65.0 in | Wt 176.8 lb

## 2020-05-31 DIAGNOSIS — Z0000 Encounter for general adult medical examination without abnormal findings: Secondary | ICD-10-CM | POA: Diagnosis not present

## 2020-05-31 DIAGNOSIS — H9193 Unspecified hearing loss, bilateral: Secondary | ICD-10-CM

## 2020-05-31 MED ORDER — MODERNA COVID-19 VACCINE 100 MCG/0.5ML IM SUSP
INTRAMUSCULAR | 0 refills | Status: DC
  Filled 2020-05-31: qty 0.25, 1d supply, fill #0

## 2020-05-31 NOTE — Telephone Encounter (Signed)
Referral placed.

## 2020-05-31 NOTE — Telephone Encounter (Signed)
Patient is requesting a referral to an Audiologist for hearing loss.

## 2020-05-31 NOTE — Telephone Encounter (Signed)
Called patient and she is now scheduled for cortisone injections tomorrow, 06/01/2020. Patient denies hypertension or diabetes.

## 2020-05-31 NOTE — Patient Instructions (Signed)
Kimberly Escobar , Thank you for taking time to come for your Medicare Wellness Visit. I appreciate your ongoing commitment to your health goals. Please review the following plan we discussed and let me know if I can assist you in the future.   Screening recommendations/referrals: Colonoscopy: Completed 06/28/2012-Due 06/29/2022 Mammogram: Completed 09/12/2019-Due 09/11/2020 Bone Density: Completed 09/12/2019-Due 09/11/2021 Recommended yearly ophthalmology/optometry visit for glaucoma screening and checkup Recommended yearly dental visit for hygiene and checkup  Vaccinations: Influenza vaccine: Up to date Pneumococcal vaccine: Up to date -Due 11/03/2020 Tdap vaccine: Up to date-Due 11/16/2021 Shingles vaccine: Completed vaccines  Covid-19:Up to date  Advanced directives: Declined information today  Conditions/risks identified: See problem list  Next appointment: Follow up in one year for your annual wellness visit 06/06/2021 @ 8:20   Preventive Care 16 Years and Older, Female Preventive care refers to lifestyle choices and visits with your health care provider that can promote health and wellness. What does preventive care include?  A yearly physical exam. This is also called an annual well check.  Dental exams once or twice a year.  Routine eye exams. Ask your health care provider how often you should have your eyes checked.  Personal lifestyle choices, including:  Daily care of your teeth and gums.  Regular physical activity.  Eating a healthy diet.  Avoiding tobacco and drug use.  Limiting alcohol use.  Practicing safe sex.  Taking low-dose aspirin every day.  Taking vitamin and mineral supplements as recommended by your health care provider. What happens during an annual well check? The services and screenings done by your health care provider during your annual well check will depend on your age, overall health, lifestyle risk factors, and family history of  disease. Counseling  Your health care provider may ask you questions about your:  Alcohol use.  Tobacco use.  Drug use.  Emotional well-being.  Home and relationship well-being.  Sexual activity.  Eating habits.  History of falls.  Memory and ability to understand (cognition).  Work and work Statistician.  Reproductive health. Screening  You may have the following tests or measurements:  Height, weight, and BMI.  Blood pressure.  Lipid and cholesterol levels. These may be checked every 5 years, or more frequently if you are over 73 years old.  Skin check.  Lung cancer screening. You may have this screening every year starting at age 29 if you have a 30-pack-year history of smoking and currently smoke or have quit within the past 15 years.  Fecal occult blood test (FOBT) of the stool. You may have this test every year starting at age 54.  Flexible sigmoidoscopy or colonoscopy. You may have a sigmoidoscopy every 5 years or a colonoscopy every 10 years starting at age 52.  Hepatitis C blood test.  Hepatitis B blood test.  Sexually transmitted disease (STD) testing.  Diabetes screening. This is done by checking your blood sugar (glucose) after you have not eaten for a while (fasting). You may have this done every 1-3 years.  Bone density scan. This is done to screen for osteoporosis. You may have this done starting at age 41.  Mammogram. This may be done every 1-2 years. Talk to your health care provider about how often you should have regular mammograms. Talk with your health care provider about your test results, treatment options, and if necessary, the need for more tests. Vaccines  Your health care provider may recommend certain vaccines, such as:  Influenza vaccine. This is recommended every year.  Tetanus, diphtheria, and acellular pertussis (Tdap, Td) vaccine. You may need a Td booster every 10 years.  Zoster vaccine. You may need this after age  4.  Pneumococcal 13-valent conjugate (PCV13) vaccine. One dose is recommended after age 106.  Pneumococcal polysaccharide (PPSV23) vaccine. One dose is recommended after age 30. Talk to your health care provider about which screenings and vaccines you need and how often you need them. This information is not intended to replace advice given to you by your health care provider. Make sure you discuss any questions you have with your health care provider. Document Released: 01/26/2015 Document Revised: 09/19/2015 Document Reviewed: 10/31/2014 Elsevier Interactive Patient Education  2017 [employer] Prevention in the Home Falls can cause injuries. They can happen to people of all ages. There are many things you can do to make your home safe and to help prevent falls. What can I do on the outside of my home?  Regularly fix the edges of walkways and driveways and fix any cracks.  Remove anything that might make you trip as you walk through a door, such as a raised step or threshold.  Trim any bushes or trees on the path to your home.  Use bright outdoor lighting.  Clear any walking paths of anything that might make someone trip, such as rocks or tools.  Regularly check to see if handrails are loose or broken. Make sure that both sides of any steps have handrails.  Any raised decks and porches should have guardrails on the edges.  Have any leaves, snow, or ice cleared regularly.  Use sand or salt on walking paths during winter.  Clean up any spills in your garage right away. This includes oil or grease spills. What can I do in the bathroom?  Use night lights.  Install grab bars by the toilet and in the tub and shower. Do not use towel bars as grab bars.  Use non-skid mats or decals in the tub or shower.  If you need to sit down in the shower, use a plastic, non-slip stool.  Keep the floor dry. Clean up any water that spills on the floor as soon as it happens.  Remove  soap buildup in the tub or shower regularly.  Attach bath mats securely with double-sided non-slip rug tape.  Do not have throw rugs and other things on the floor that can make you trip. What can I do in the bedroom?  Use night lights.  Make sure that you have a light by your bed that is easy to reach.  Do not use any sheets or blankets that are too big for your bed. They should not hang down onto the floor.  Have a firm chair that has side arms. You can use this for support while you get dressed.  Do not have throw rugs and other things on the floor that can make you trip. What can I do in the kitchen?  Clean up any spills right away.  Avoid walking on wet floors.  Keep items that you use a lot in easy-to-reach places.  If you need to reach something above you, use a strong step stool that has a grab bar.  Keep electrical cords out of the way.  Do not use floor polish or wax that makes floors slippery. If you must use wax, use non-skid floor wax.  Do not have throw rugs and other things on the floor that can make you trip. What can I do  with my stairs?  Do not leave any items on the stairs.  Make sure that there are handrails on both sides of the stairs and use them. Fix handrails that are broken or loose. Make sure that handrails are as long as the stairways.  Check any carpeting to make sure that it is firmly attached to the stairs. Fix any carpet that is loose or worn.  Avoid having throw rugs at the top or bottom of the stairs. If you do have throw rugs, attach them to the floor with carpet tape.  Make sure that you have a light switch at the top of the stairs and the bottom of the stairs. If you do not have them, ask someone to add them for you. What else can I do to help prevent falls?  Wear shoes that:  Do not have high heels.  Have rubber bottoms.  Are comfortable and fit you well.  Are closed at the toe. Do not wear sandals.  If you use a  stepladder:  Make sure that it is fully opened. Do not climb a closed stepladder.  Make sure that both sides of the stepladder are locked into place.  Ask someone to hold it for you, if possible.  Clearly mark and make sure that you can see:  Any grab bars or handrails.  First and last steps.  Where the edge of each step is.  Use tools that help you move around (mobility aids) if they are needed. These include:  Canes.  Walkers.  Scooters.  Crutches.  Turn on the lights when you go into a dark area. Replace any light bulbs as soon as they burn out.  Set up your furniture so you have a clear path. Avoid moving your furniture around.  If any of your floors are uneven, fix them.  If there are any pets around you, be aware of where they are.  Review your medicines with your doctor. Some medicines can make you feel dizzy. This can increase your chance of falling. Ask your doctor what other things that you can do to help prevent falls. This information is not intended to replace advice given to you by your health care provider. Make sure you discuss any questions you have with your health care provider. Document Released: 10/26/2008 Document Revised: 06/07/2015 Document Reviewed: 02/03/2014 Elsevier Interactive Patient Education  2017 [employer].

## 2020-05-31 NOTE — Telephone Encounter (Signed)
Please a schedule her to receive a cortisone injection.

## 2020-06-01 ENCOUNTER — Ambulatory Visit (INDEPENDENT_AMBULATORY_CARE_PROVIDER_SITE_OTHER): Payer: Medicare Other | Admitting: Rheumatology

## 2020-06-01 ENCOUNTER — Other Ambulatory Visit (HOSPITAL_BASED_OUTPATIENT_CLINIC_OR_DEPARTMENT_OTHER): Payer: Self-pay

## 2020-06-01 VITALS — BP 121/76 | HR 92

## 2020-06-01 DIAGNOSIS — M25561 Pain in right knee: Secondary | ICD-10-CM

## 2020-06-01 DIAGNOSIS — M25562 Pain in left knee: Secondary | ICD-10-CM

## 2020-06-01 DIAGNOSIS — G8929 Other chronic pain: Secondary | ICD-10-CM

## 2020-06-01 DIAGNOSIS — M170 Bilateral primary osteoarthritis of knee: Secondary | ICD-10-CM | POA: Diagnosis not present

## 2020-06-01 MED ORDER — TRIAMCINOLONE ACETONIDE 40 MG/ML IJ SUSP
40.0000 mg | INTRAMUSCULAR | Status: AC | PRN
  Administered 2020-06-01: 40 mg via INTRA_ARTICULAR

## 2020-06-01 MED ORDER — LIDOCAINE HCL 1 % IJ SOLN
1.5000 mL | INTRAMUSCULAR | Status: AC | PRN
  Administered 2020-06-01: 1.5 mL

## 2020-06-01 NOTE — Progress Notes (Signed)
   Procedure Note  Patient: Kimberly Escobar             Date of Birth: 1955-05-25           MRN: [id number]             Visit Date: 06/01/2020 Pain in both knees  Patient received her last Visco supplement injection on May 17, 2020.  She states she has not had much relief and continues to have discomfort in her bilateral knee joints.  We discussed cortisone injections to bilateral knee joints.  Patient wants to proceed with that.  Indications side effects contraindications of cortisone injections were discussed.  Examination both knee joints are full range of motion without any warmth swelling or effusion.  No redness was noted.  Procedures: Visit Diagnoses:  1. Primary osteoarthritis of both knees   2. Chronic pain of both knees     Large Joint Inj: bilateral knee on 06/01/2020 11:20 AM Indications: pain Details: 27 G 1.5 in needle, medial approach  Arthrogram: No  Medications (Right): 1.5 mL lidocaine 1 %; 40 mg triamcinolone acetonide 40 MG/ML Aspirate (Right): 0 mL Medications (Left): 1.5 mL lidocaine 1 %; 40 mg triamcinolone acetonide 40 MG/ML Aspirate (Left): 0 mL Outcome: tolerated well, no immediate complications Procedure, treatment alternatives, risks and benefits explained, specific risks discussed. Consent was given by the patient. Immediately prior to procedure a time out was called to verify the correct patient, procedure, equipment, support staff and site/side marked as required. Patient was prepped and draped in the usual sterile fashion.     Postprocedure instructions were given. Bo Merino, MD

## 2020-06-09 ENCOUNTER — Other Ambulatory Visit: Payer: Self-pay | Admitting: Family Medicine

## 2020-06-09 DIAGNOSIS — H6991 Unspecified Eustachian tube disorder, right ear: Secondary | ICD-10-CM

## 2020-06-09 DIAGNOSIS — H6981 Other specified disorders of Eustachian tube, right ear: Secondary | ICD-10-CM

## 2020-06-19 ENCOUNTER — Ambulatory Visit: Payer: Medicare Other | Attending: Family Medicine | Admitting: Audiologist

## 2020-06-19 ENCOUNTER — Other Ambulatory Visit: Payer: Self-pay

## 2020-06-19 DIAGNOSIS — H903 Sensorineural hearing loss, bilateral: Secondary | ICD-10-CM | POA: Diagnosis not present

## 2020-06-19 NOTE — Procedures (Signed)
[facility] [place], [place]  [phone]  AUDIOLOGICAL  EVALUATION  NAME: Kimberly Escobar     DOB:   02/14/1955      MRN: [id number]                                                                                     DATE: 06/19/2020     REFERENT: [name], DO STATUS: Outpatient DIAGNOSIS: Sensorineural Bilateral Hearing Loss     History: [name] was seen for an audiological evaluation.  [name] is receiving a hearing evaluation due to concerns for difficulty understanding her husband. [name] says she feels like her husband is mumbling. She cannot understand him like she used to and is concerned. She denies difficulty hearing elsewhere or in background noise. This difficulty began gradually around a year ago. No pain or pressure reported in either ear at this time. [name] has a history of ear pain due to chronic eustachian tube dysfunction. She takes a nose spray that keeps her eustachian tubes functioning and prevents the ear pain. She did not take the spray this morning. Tinnitus present in both ears in the form of her heart beat, she hears her heart beat at night. [name] has a history of ear infections per her reporting. She said she used to think it was just water in her ears from the shower. No history of excessive noise exposure.  No other relevant case history reported.    Evaluation:   Otoscopy showed a clear view of the tympanic membranes, bilaterally  Tympanometry results were consistent with normal but shallow middle ear function bilaterally    Audiometric testing was completed using conventional audiometry with insert transducer. Speech Recognition Thresholds were consistent with pure tone averages. Word Recognition was excellent at an elevated level. Pure tone thresholds show normal to mild sensorineural cookie bite hearing loss hearing loss in both ears. Test results are consistent with  symmetric cookie bite hearing loss.   Results:  The test results were reviewed with [name]. The nature and degree of her hearing loss was explained and she was provided with two copies of her audiogram. 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 Her ability to understand speech was excellent at loud level. She is missing the mid pitched speech sounds such as /k/ /p/ and /ch/. If the speaker is not face to face or greater than five feet from her, then speech will sound muffled and unclear. She needs to see people's face and only speak within five feet in order to fill in the speech sounds she misses. We often practice the least effective communication at home with the people we spend most our time with. Her husband likely sounds unclear because she is missing his consonant sounds when he tries to talk to her from a distance. [name] reported understanding all that was counseled today.  Due to her symptoms of ear pain, occasional pulsatile tinnitus and a cookie bite configuration of hearing loss recommend follow up with an ear nose and throat physician.  This is a conservative recommendation just to be certain there is not an underlying medical  reason for these symptoms. [name]'s also needs annual hearing tests to monitor the progression of her hearing loss.   Recommendations: 1. Recommend annual audiologic monitoring and good communication strategies.  2. Referral to ENT Physician necessary due to cookie bite hearing loss, report of pulsatile tinnitus at night, and history of eustachian tube dysfunction.    Alfonse Alpers  Audiologist, Au.D., CCC-A 06/19/2020  9:56 AM  Cc: [name], DO

## 2020-07-04 DIAGNOSIS — H04123 Dry eye syndrome of bilateral lacrimal glands: Secondary | ICD-10-CM | POA: Diagnosis not present

## 2020-07-04 DIAGNOSIS — H04553 Acquired stenosis of bilateral nasolacrimal duct: Secondary | ICD-10-CM | POA: Diagnosis not present

## 2020-07-04 DIAGNOSIS — H2513 Age-related nuclear cataract, bilateral: Secondary | ICD-10-CM | POA: Diagnosis not present

## 2020-07-04 DIAGNOSIS — H04203 Unspecified epiphora, bilateral lacrimal glands: Secondary | ICD-10-CM | POA: Diagnosis not present

## 2020-07-13 ENCOUNTER — Ambulatory Visit: Payer: Medicare Other | Admitting: Family Medicine

## 2020-07-17 DIAGNOSIS — H04203 Unspecified epiphora, bilateral lacrimal glands: Secondary | ICD-10-CM | POA: Diagnosis not present

## 2020-07-17 DIAGNOSIS — H04563 Stenosis of bilateral lacrimal punctum: Secondary | ICD-10-CM | POA: Diagnosis not present

## 2020-07-30 ENCOUNTER — Ambulatory Visit: Payer: Medicare Other | Admitting: Family Medicine

## 2020-08-02 ENCOUNTER — Other Ambulatory Visit: Payer: Self-pay

## 2020-08-02 ENCOUNTER — Ambulatory Visit (INDEPENDENT_AMBULATORY_CARE_PROVIDER_SITE_OTHER): Payer: Medicare Other | Admitting: Family Medicine

## 2020-08-02 ENCOUNTER — Other Ambulatory Visit (HOSPITAL_BASED_OUTPATIENT_CLINIC_OR_DEPARTMENT_OTHER): Payer: Self-pay | Admitting: Family Medicine

## 2020-08-02 ENCOUNTER — Encounter: Payer: Self-pay | Admitting: Family Medicine

## 2020-08-02 VITALS — BP 124/60 | HR 70 | Temp 98.8°F | Resp 18 | Ht 65.0 in | Wt 178.0 lb

## 2020-08-02 DIAGNOSIS — M797 Fibromyalgia: Secondary | ICD-10-CM | POA: Insufficient documentation

## 2020-08-02 DIAGNOSIS — G4709 Other insomnia: Secondary | ICD-10-CM | POA: Diagnosis not present

## 2020-08-02 DIAGNOSIS — I10 Essential (primary) hypertension: Secondary | ICD-10-CM | POA: Diagnosis not present

## 2020-08-02 DIAGNOSIS — E785 Hyperlipidemia, unspecified: Secondary | ICD-10-CM

## 2020-08-02 DIAGNOSIS — R600 Localized edema: Secondary | ICD-10-CM

## 2020-08-02 DIAGNOSIS — Z1231 Encounter for screening mammogram for malignant neoplasm of breast: Secondary | ICD-10-CM

## 2020-08-02 LAB — LIPID PANEL
Cholesterol: 200 mg/dL (ref 0–200)
HDL: 68.6 mg/dL (ref >39.00)
LDL Cholesterol: 116 mg/dL — ABNORMAL HIGH (ref 0–99)
NonHDL: 131.56
Total CHOL/HDL Ratio: 3
Triglycerides: 78 mg/dL (ref 0.0–149.0)
VLDL: 15.6 mg/dL (ref 0.0–40.0)

## 2020-08-02 LAB — COMPREHENSIVE METABOLIC PANEL
ALT: 20 U/L (ref 0–35)
AST: 10 U/L (ref 0–37)
Albumin: 4.5 g/dL (ref 3.5–5.2)
Alkaline Phosphatase: 74 U/L (ref 39–117)
BUN: 20 mg/dL (ref 6–23)
CO2: 28 mEq/L (ref 19–32)
Calcium: 10.1 mg/dL (ref 8.4–10.5)
Chloride: 103 mEq/L (ref 96–112)
Creatinine, Ser: 0.85 mg/dL (ref 0.40–1.20)
GFR: 71.16 mL/min (ref >60.00)
Glucose, Bld: 84 mg/dL (ref 70–99)
Potassium: 4.3 mEq/L (ref 3.5–5.1)
Sodium: 140 mEq/L (ref 135–145)
Total Bilirubin: 0.5 mg/dL (ref 0.2–1.2)
Total Protein: 7.5 g/dL (ref 6.0–8.3)

## 2020-08-02 MED ORDER — FUROSEMIDE 40 MG PO TABS: 40.0000 mg | ORAL_TABLET | Freq: Every day | ORAL | 3 refills | Status: DC

## 2020-08-02 NOTE — Assessment & Plan Note (Signed)
Pt sees rheum and is on gabepentin and flexeril She has some tramadol left over from ortho but does not take it regularly
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
## 2020-08-02 NOTE — Assessment & Plan Note (Signed)
Encourage heart healthy diet such as MIND or DASH diet, increase exercise, avoid trans fats, simple carbohydrates and processed foods, consider a krill or fish or flaxseed oil cap daily.  °

## 2020-08-02 NOTE — Patient Instructions (Signed)

## 2020-08-02 NOTE — Assessment & Plan Note (Signed)
Well controlled, no changes to meds. Encouraged heart healthy diet such as the DASH diet and exercise as tolerated.  °

## 2020-08-02 NOTE — Assessment & Plan Note (Signed)
-  cont melatonin

## 2020-08-16 DIAGNOSIS — H04563 Stenosis of bilateral lacrimal punctum: Secondary | ICD-10-CM | POA: Diagnosis not present

## 2020-08-16 DIAGNOSIS — H04203 Unspecified epiphora, bilateral lacrimal glands: Secondary | ICD-10-CM | POA: Diagnosis not present

## 2020-08-25 IMAGING — DX DG CERVICAL SPINE COMPLETE 4+V
6 series · 6 of 6 positions shown · non-contrast
Comparison: None.

CLINICAL DATA: Bilateral neck pain and tenderness for 1 month.
Cervical radiculopathy.

EXAM:
CERVICAL SPINE - COMPLETE 4+ VIEW

[c-spine lat]
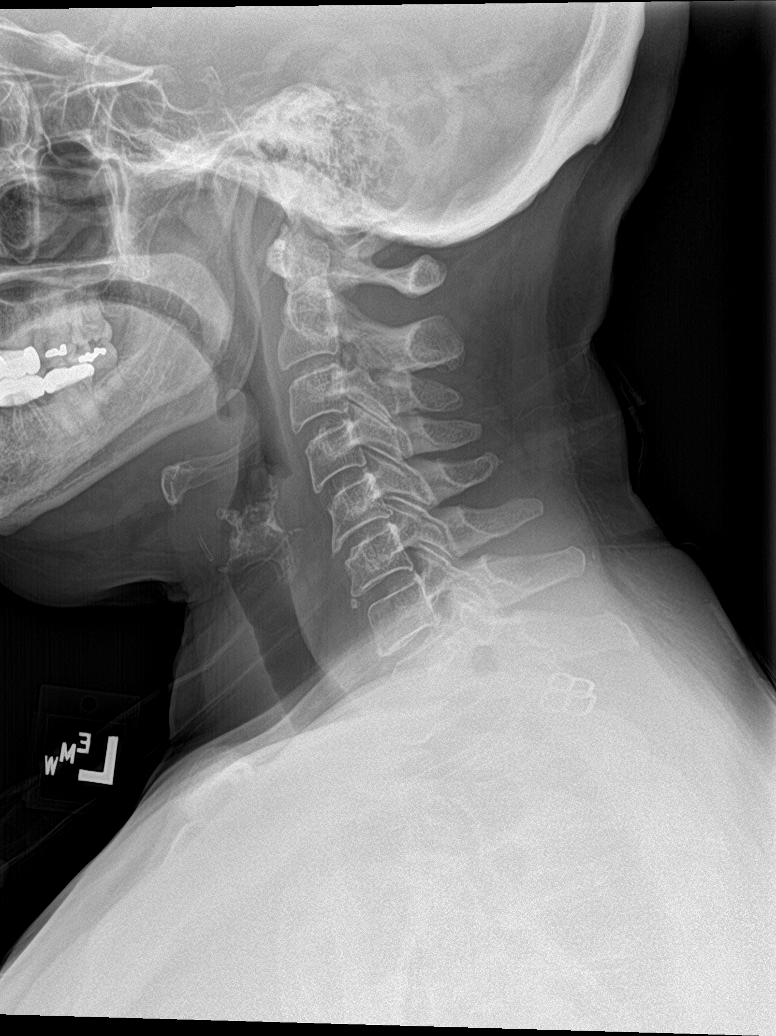

[c-spine obl (1 of 2)]
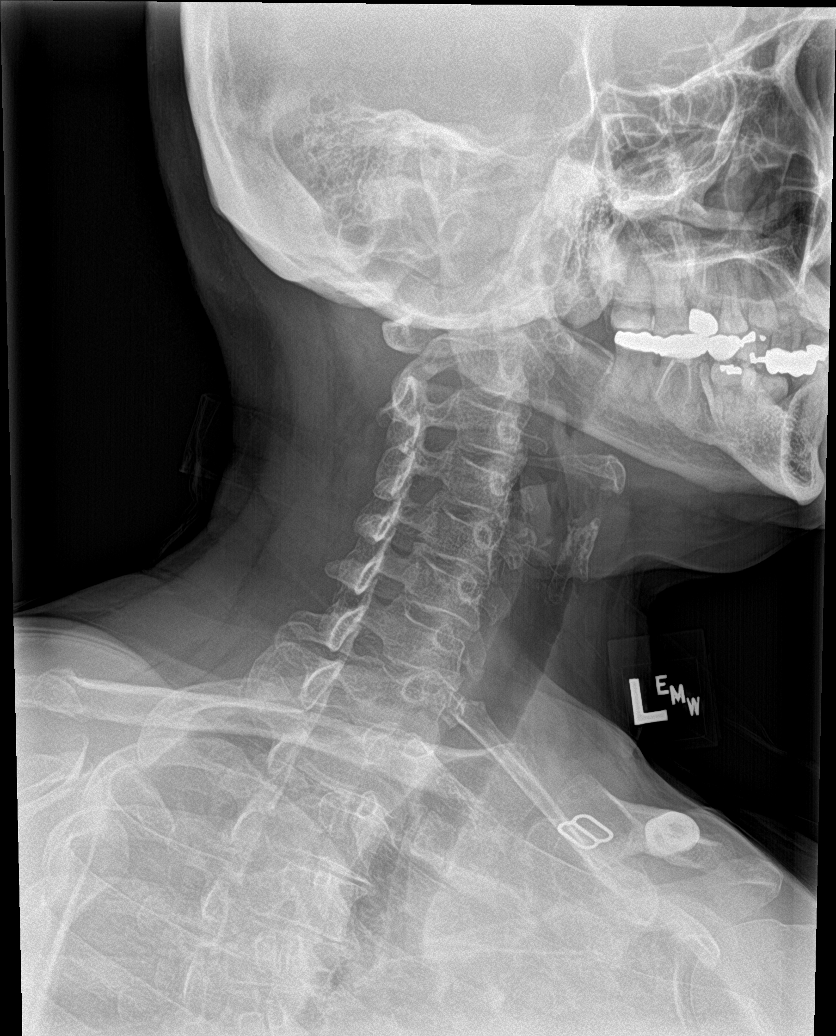

[c-spine obl (2 of 2)]
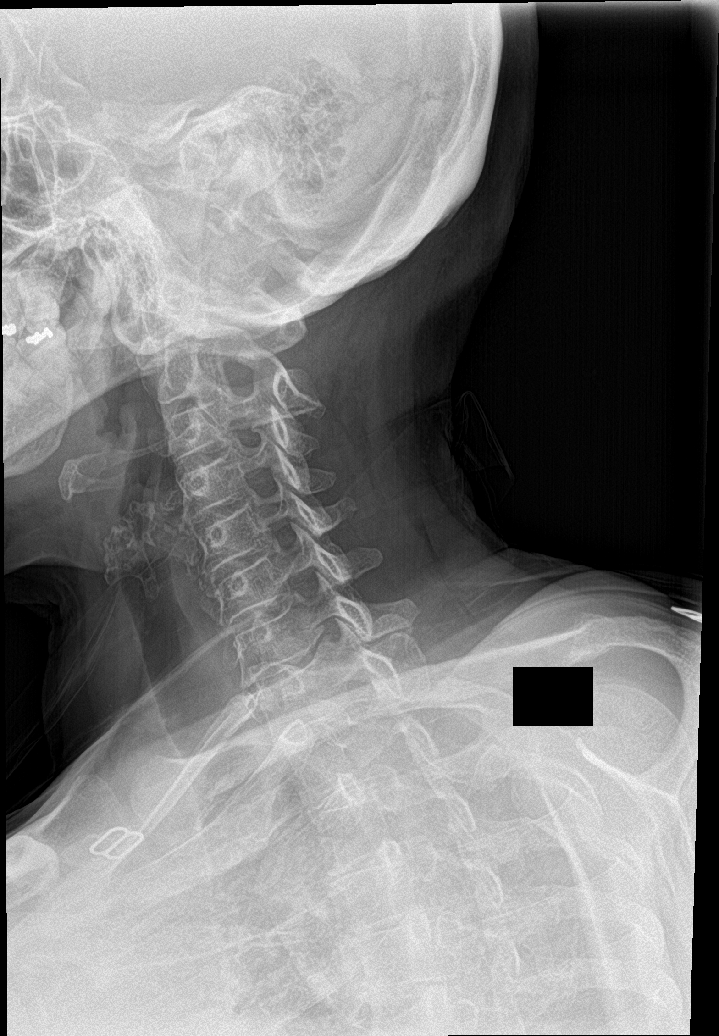

[c-spine ap]
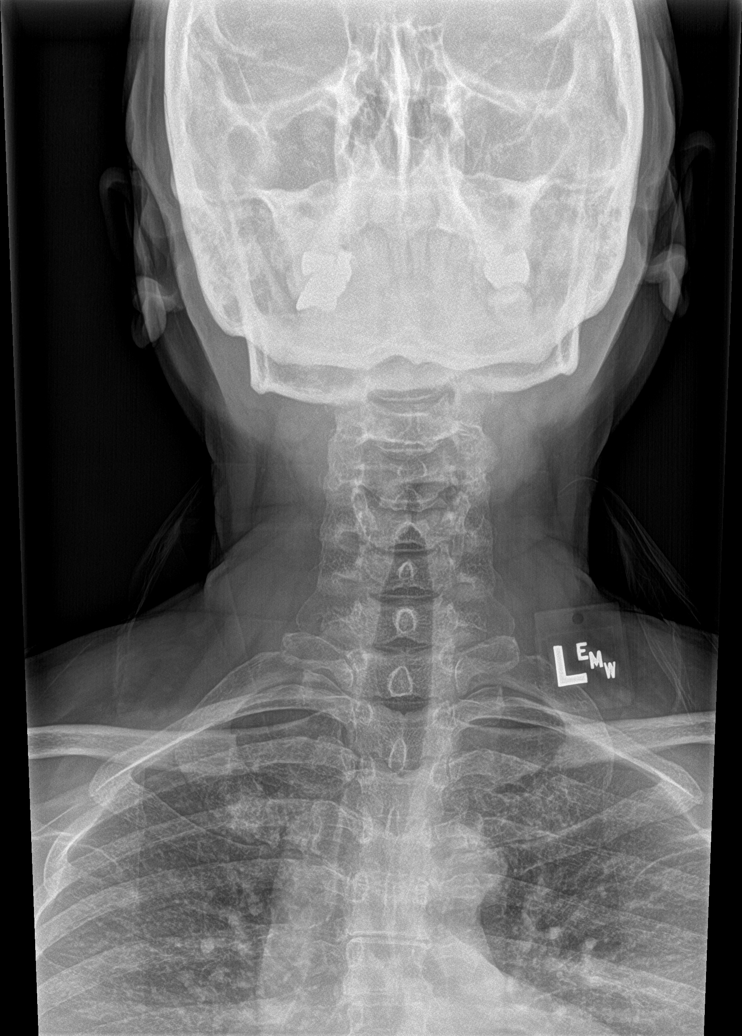

[c-spine open mouth]
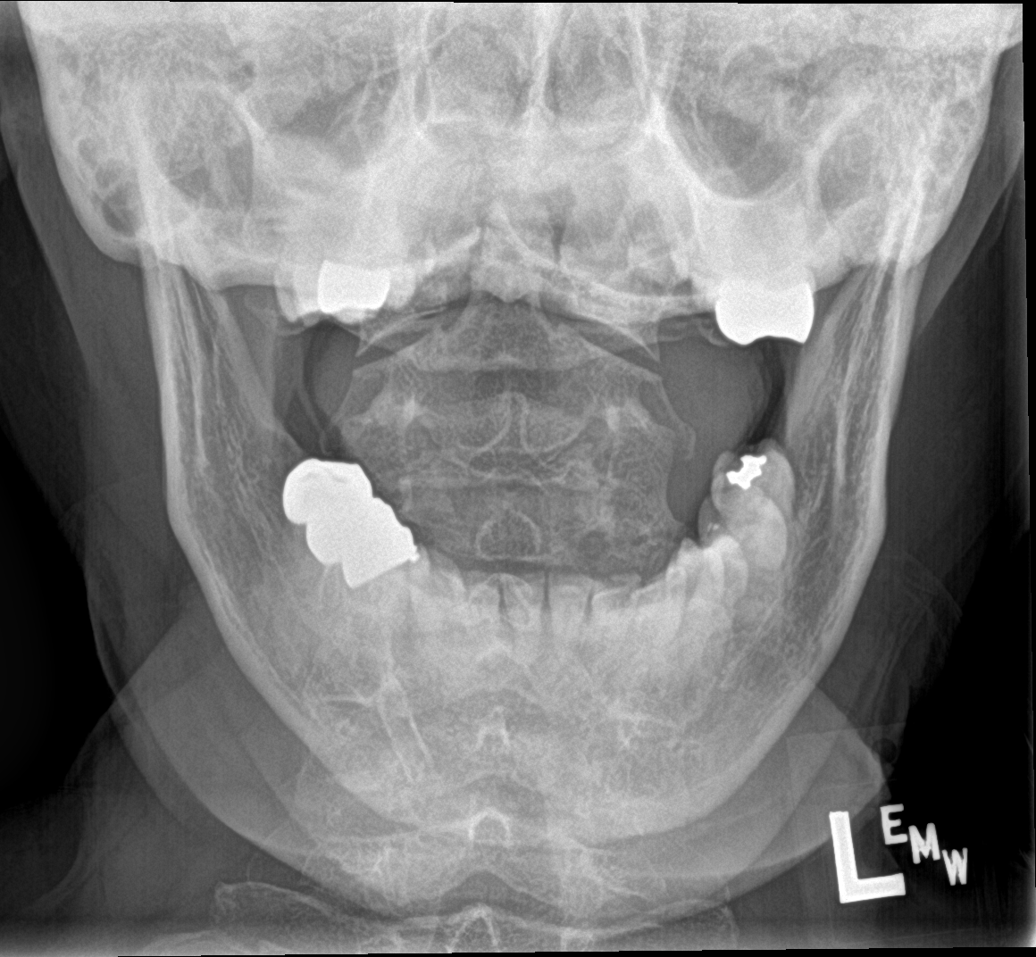

[[person_name]]
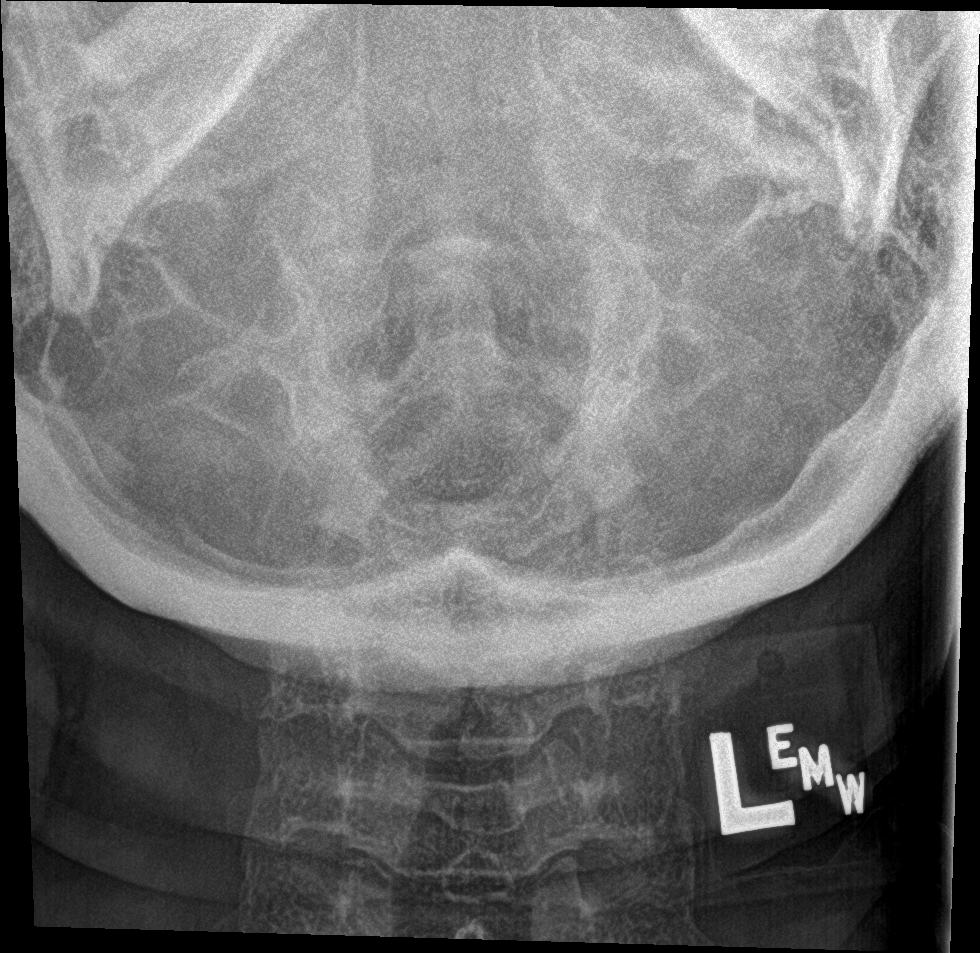

[6 of 6 positions shown; findings below may reference images not displayed]

FINDINGS: Normal alignment of the cervical spine and cervicothoracic junction.
Mild endplate disease at C5 and C6. Prevertebral soft tissues are
normal. No significant bony encroachment of the neural foramen. Mild
bilateral facet arthropathy. Vertebral body heights are maintained
and no evidence for fracture. No significant disc space loss.
IMPRESSION: No acute abnormality.  Mild degenerative disease.

## 2020-08-27 ENCOUNTER — Encounter: Payer: Self-pay | Admitting: Family Medicine

## 2020-08-27 ENCOUNTER — Ambulatory Visit (INDEPENDENT_AMBULATORY_CARE_PROVIDER_SITE_OTHER): Payer: Medicare Other | Admitting: Family Medicine

## 2020-08-27 ENCOUNTER — Telehealth: Payer: Self-pay | Admitting: General Practice

## 2020-08-27 ENCOUNTER — Other Ambulatory Visit: Payer: Self-pay

## 2020-08-27 VITALS — BP 118/68 | HR 88 | Temp 98.9°F | Resp 18 | Ht 65.0 in | Wt 179.0 lb

## 2020-08-27 DIAGNOSIS — R1031 Right lower quadrant pain: Secondary | ICD-10-CM

## 2020-08-27 LAB — POC URINALSYSI DIPSTICK (AUTOMATED)
Bilirubin, UA: NEGATIVE
Blood, UA: NEGATIVE
Glucose, UA: NEGATIVE
Ketones, UA: NEGATIVE
Leukocytes, UA: NEGATIVE
Nitrite, UA: NEGATIVE
Protein, UA: NEGATIVE
Spec Grav, UA: 1.01 (ref 1.010–1.025)
Urobilinogen, UA: 0.2 E.U./dL
pH, UA: 6.5 (ref 5.0–8.0)

## 2020-08-27 NOTE — Patient Instructions (Signed)

## 2020-08-27 NOTE — Assessment & Plan Note (Signed)
Pain has resolved  Check ua and labs If pain returns rto or call

## 2020-08-27 NOTE — Telephone Encounter (Signed)
Left message on VM for patient to contact our office to schedule Annual exam with Dr. Ihor Dow in October 2022.

## 2020-08-27 NOTE — Telephone Encounter (Signed)
-----   Message from Maurine Minister, [place] sent at 08/21/2020 11:00 AM EDT ----- Regarding: Annual Exam Schedule Annual with Dr. Eugenie Norrie in October .  Last Annual & pap on 07/11/2019.
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
## 2020-08-28 LAB — COMPREHENSIVE METABOLIC PANEL
ALT: 20 U/L (ref 0–35)
AST: 9 U/L (ref 0–37)
Albumin: 4.4 g/dL (ref 3.5–5.2)
Alkaline Phosphatase: 74 U/L (ref 39–117)
BUN: 18 mg/dL (ref 6–23)
CO2: 29 mEq/L (ref 19–32)
Calcium: 10.1 mg/dL (ref 8.4–10.5)
Chloride: 102 mEq/L (ref 96–112)
Creatinine, Ser: 0.89 mg/dL (ref 0.40–1.20)
GFR: 67.31 mL/min (ref >60.00)
Glucose, Bld: 103 mg/dL — ABNORMAL HIGH (ref 70–99)
Potassium: 3.8 mEq/L (ref 3.5–5.1)
Sodium: 139 mEq/L (ref 135–145)
Total Bilirubin: 0.2 mg/dL (ref 0.2–1.2)
Total Protein: 7.9 g/dL (ref 6.0–8.3)

## 2020-08-28 LAB — CBC WITH DIFFERENTIAL/PLATELET
Basophils Absolute: 0.1 10*3/uL (ref 0.0–0.1)
Basophils Relative: 1.2 % (ref 0.0–3.0)
Eosinophils Absolute: 0.1 10*3/uL (ref 0.0–0.7)
Eosinophils Relative: 1.5 % (ref 0.0–5.0)
HCT: 40.5 % (ref 36.0–46.0)
Hemoglobin: 13.4 g/dL (ref 12.0–15.0)
Lymphocytes Relative: 32.7 % (ref 12.0–46.0)
Lymphs Abs: 3.1 10*3/uL (ref 0.7–4.0)
MCHC: 33.1 g/dL (ref 30.0–36.0)
MCV: 96.7 fl (ref 78.0–100.0)
Monocytes Absolute: 0.5 10*3/uL (ref 0.1–1.0)
Monocytes Relative: 5.7 % (ref 3.0–12.0)
Neutro Abs: 5.6 10*3/uL (ref 1.4–7.7)
Neutrophils Relative %: 58.9 % (ref 43.0–77.0)
Platelets: 289 10*3/uL (ref 150.0–400.0)
RBC: 4.19 Mil/uL (ref 3.87–5.11)
RDW: 13.6 % (ref 11.5–15.5)
WBC: 9.5 10*3/uL (ref 4.0–10.5)

## 2020-09-04 DIAGNOSIS — H04203 Unspecified epiphora, bilateral lacrimal glands: Secondary | ICD-10-CM | POA: Diagnosis not present

## 2020-09-04 DIAGNOSIS — H04563 Stenosis of bilateral lacrimal punctum: Secondary | ICD-10-CM | POA: Diagnosis not present

## 2020-09-04 DIAGNOSIS — Z09 Encounter for follow-up examination after completed treatment for conditions other than malignant neoplasm: Secondary | ICD-10-CM | POA: Diagnosis not present

## 2020-09-15 DIAGNOSIS — H2513 Age-related nuclear cataract, bilateral: Secondary | ICD-10-CM | POA: Diagnosis not present

## 2020-09-18 ENCOUNTER — Ambulatory Visit (HOSPITAL_BASED_OUTPATIENT_CLINIC_OR_DEPARTMENT_OTHER)
Admission: RE | Admit: 2020-09-18 | Discharge: 2020-09-18 | Disposition: A | Discharge status: Home / Self Care | Payer: Medicare Other | Source: Ambulatory Visit | Attending: Family Medicine | Admitting: Family Medicine

## 2020-09-18 ENCOUNTER — Encounter (HOSPITAL_BASED_OUTPATIENT_CLINIC_OR_DEPARTMENT_OTHER): Payer: Self-pay

## 2020-09-18 ENCOUNTER — Other Ambulatory Visit: Payer: Self-pay

## 2020-09-18 DIAGNOSIS — Z1231 Encounter for screening mammogram for malignant neoplasm of breast: Secondary | ICD-10-CM | POA: Diagnosis not present

## 2020-09-20 DIAGNOSIS — H25813 Combined forms of age-related cataract, bilateral: Secondary | ICD-10-CM | POA: Diagnosis not present

## 2020-09-20 DIAGNOSIS — H43822 Vitreomacular adhesion, left eye: Secondary | ICD-10-CM | POA: Diagnosis not present

## 2020-09-20 DIAGNOSIS — H16223 Keratoconjunctivitis sicca, not specified as Sjogren's, bilateral: Secondary | ICD-10-CM | POA: Diagnosis not present
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
## 2020-10-17 ENCOUNTER — Ambulatory Visit: Payer: Medicare Other | Admitting: Rheumatology

## 2020-10-17 DIAGNOSIS — E785 Hyperlipidemia, unspecified: Secondary | ICD-10-CM

## 2020-10-17 DIAGNOSIS — G8929 Other chronic pain: Secondary | ICD-10-CM

## 2020-10-17 DIAGNOSIS — M7062 Trochanteric bursitis, left hip: Secondary | ICD-10-CM

## 2020-10-17 DIAGNOSIS — M8589 Other specified disorders of bone density and structure, multiple sites: Secondary | ICD-10-CM

## 2020-10-17 DIAGNOSIS — L819 Disorder of pigmentation, unspecified: Secondary | ICD-10-CM

## 2020-10-17 DIAGNOSIS — Z8719 Personal history of other diseases of the digestive system: Secondary | ICD-10-CM

## 2020-10-17 DIAGNOSIS — Z8669 Personal history of other diseases of the nervous system and sense organs: Secondary | ICD-10-CM

## 2020-10-17 DIAGNOSIS — M19041 Primary osteoarthritis, right hand: Secondary | ICD-10-CM

## 2020-10-17 DIAGNOSIS — Z9889 Other specified postprocedural states: Secondary | ICD-10-CM

## 2020-10-17 DIAGNOSIS — R768 Other specified abnormal immunological findings in serum: Secondary | ICD-10-CM

## 2020-10-17 DIAGNOSIS — M170 Bilateral primary osteoarthritis of knee: Secondary | ICD-10-CM

## 2020-10-17 DIAGNOSIS — G4709 Other insomnia: Secondary | ICD-10-CM

## 2020-10-22 ENCOUNTER — Ambulatory Visit: Payer: Medicare Other | Admitting: Obstetrics & Gynecology

## 2020-11-06 ENCOUNTER — Ambulatory Visit: Payer: Medicare Other | Attending: Internal Medicine

## 2020-11-06 DIAGNOSIS — Z23 Encounter for immunization: Secondary | ICD-10-CM

## 2020-11-06 NOTE — Progress Notes (Signed)
   Covid-19 Vaccination Clinic  Name:  Kimberly Escobar    MRN: [id number] DOB: March 25, 1955  11/06/2020  [name] was observed post Covid-19 immunization for 15 minutes without incident. She was provided with Vaccine Information Sheet and instruction to access the V-Safe system.   [name] was instructed to call 911 with any severe reactions post vaccine: Difficulty breathing  Swelling of face and throat  A fast heartbeat  A bad rash all over body  Dizziness and weakness   Immunizations Administered     Name Date Dose VIS Date Route   Moderna Covid-19 vaccine Bivalent Booster 11/06/2020 11:23 AM 0.5 mL 08/25/2020 Intramuscular   Manufacturer: Moderna   Lot: [id number]   [place]: [id number]

## 2020-11-12 ENCOUNTER — Other Ambulatory Visit (HOSPITAL_BASED_OUTPATIENT_CLINIC_OR_DEPARTMENT_OTHER): Payer: Self-pay

## 2020-11-12 MED ORDER — INFLUENZA VAC A&B SA ADJ QUAD 0.5 ML IM PRSY
PREFILLED_SYRINGE | INTRAMUSCULAR | 0 refills | Status: DC
  Filled 2020-11-12: qty 0.5, 1d supply, fill #0

## 2020-11-14 ENCOUNTER — Telehealth: Payer: Self-pay | Admitting: Family Medicine

## 2020-11-14 NOTE — Telephone Encounter (Signed)
Pt stated she has been having pains on her right side from her eye down to her feet, she was transferred to triage. Please advise.

## 2020-11-15 NOTE — Telephone Encounter (Signed)
Patient has appt with Sports Medicine on 11/16/2020.    [facility] - ClientTELEPHONE ADVICE RECORDAccessNurse Patient Name:Kimberly Escobar [phone](Primary) Initial Comment Caller states that she is having sciatica pain and would like to know what she can do for that as this current pain has been going on for 4 days. Translation No Nurse Assessment Nurse: Ottis Stain, RN, Sherrie Date/Time (Eastern Time): 11/14/2020 1:25:33 PM Confirm and document reason for call. If symptomatic, describe symptoms. ---Caller states having right side sciatica pain for about a week. Pain radiates down to right ankle. Denies any numbness or weakness in leg. Also having upper right arm pain in muscle area for the last couple of weeks. Denies any weakness/numbness in arm. Able to move arm and make a fist (hold a cup). No fevers. Does the patient have any new or worsening symptoms? ---Yes Will a triage be completed? ---Yes Related visit to physician within the last 2 weeks? ---No Does the PT have any chronic conditions? (i.e. diabetes, asthma, this includes High risk factors for pregnancy, etc.) ---Yes List chronic conditions. ---Low back injury with pinched nerve, Migraines, Arthritis, Is this a behavioral health or substance abuse call? ---No Guidelines Guideline Title Affirmed Question Affirmed Notes Nurse Date/Time (Eastern Time) Back Pain [1] Pain radiates into the thigh or further down the leg AND [2] one leg Ottis Stain, RN, [place] 11/14/2020 1:30:08 PM Arm Pain [1] MODERATE pain (e.g., interferes Ottis Stain, RN, [place] 11/14/2020 1:36:11 PM PLEASE NOTE: All timestamps contained within this report are represented as [place] Standard Time. CONFIDENTIALTY NOTICE: This fax transmission is intended only for the addressee. It contains information that is legally privileged, confidential or otherwise protected from use or disclosure. If you are not the intended recipient,  you are strictly prohibited from reviewing, disclosing, copying using or disseminating any of this information or taking any action in reliance on or regarding this information. If you have received this fax in error, please notify us immediately by telephone so that we can arrange for its return to [place]. Phone: [phone], Toll-Free: [phone], Fax: [phone] Page: 2 of 2 Call Id: [id number] Guidelines Guideline Title Affirmed Question Affirmed Notes Nurse Date/Time Eilene Ghazi Time) with normal activities) AND [2] present > 3 days Disp. Time Eilene Ghazi Time) Disposition Final User 11/14/2020 12:47:43 PM Attempt made - message left Ottis Stain, RN, [place] 11/14/2020 1:08:38 PM Attempt made - message left Ottis Stain, RN, [place] 11/14/2020 1:35:41 PM SEE PCP WITHIN 3 DAYS Ottis Stain, RN, [place] 11/14/2020 1:41:23 PM SEE PCP WITHIN 3 DAYS Yes Ottis Stain, RN, Sherrie Caller Disagree/Comply Personal assistant Understands Yes PreDisposition Call Doctor Care Advice Given Per Guideline SEE PCP WITHIN 3 DAYS: * You need to be seen within 2 or 3 days. USE HEAT: * Use a heat pack, heating pad, or warm wet washcloth. * Do this for 10 minutes three times a day. * This will help increase blood flow and decrease pain. SLEEP: * Sleep on your side with a pillow between your knees. * If you sleep on your back, place a pillow under your knees. * Avoid sleeping on the abdomen. The mattress should be firm or reinforced with a board. ACTIVITY: * Avoid any activities that cause severe pain. * Use caution and commonsense when performing heavy lifting. Similarly, be careful during strenuous exercise. CALL BACK IF: * Numbness or weakness occurs, or bowel/bladder problems * There are any urine symptoms or fever * You become worse SEE PCP WITHIN 3 DAYS: * You need to be seen within 2 or 3 days.  PAIN MEDICINES: * For pain relief, you can take either acetaminophen, ibuprofen, or naproxen. * IBUPROFEN (E.G., MOTRIN, ADVIL): Take 400 mg (two 200  mg pills) by mouth every 6 hours. The most you should take is 6 pills a day (1,200 mg total). CALL BACK IF: * Severe pain occurs * Arm swelling occurs * You become worse Comments User: Evlyn Clines, RN Date/Time (Eastern Time): 11/14/2020 1:31:10 PM No back pain right now. User: Evlyn Clines, RN Date/Time Eilene Ghazi Time): 11/14/2020 1:38:18 PM Right arm pain 2/10. Hurts worse at night up to 4-5/10. Referrals REFERRED TO PCP OFFICE

## 2020-11-16 ENCOUNTER — Ambulatory Visit (INDEPENDENT_AMBULATORY_CARE_PROVIDER_SITE_OTHER): Payer: Medicare Other | Admitting: Family Medicine

## 2020-11-16 ENCOUNTER — Encounter: Payer: Self-pay | Admitting: Family Medicine

## 2020-11-16 VITALS — BP 106/80 | Ht 65.0 in | Wt 177.0 lb

## 2020-11-16 DIAGNOSIS — M75101 Unspecified rotator cuff tear or rupture of right shoulder, not specified as traumatic: Secondary | ICD-10-CM | POA: Diagnosis not present

## 2020-11-16 DIAGNOSIS — G5701 Lesion of sciatic nerve, right lower limb: Secondary | ICD-10-CM

## 2020-11-16 NOTE — Patient Instructions (Signed)
The right sided posterior buttock and leg pain is probably related to the sciatic nerve being irritated.  Sometimes this can come from the back and sometimes this can come from the muscles in the buttock area.  Since you have pretty significant relief with the stretches that you are doing, I suspect this is a problem with the buttock muscles, probably specifically the piriformis.  The stretches you are doing are perfect.  I would add a couple of exercises to that and have you do the exercise program twice a day every day until I see you back.  Continue to do additional stretches if you have an episode of pain.  Regarding the arm pain, I think this is probably from some mild impingement within the shoulder joint.  This is known as rotator cuff syndrome.  It is very common.  As per our discussion, we will start with rotator cuff exercise program that we have shown you in given you a handout form.  Please do it at least once a day every day until I see you back in 3 weeks.  Doing it twice a day would be good too.  If you have any new or worsening symptoms before you come back, please feel free to give [place] a call.  Nice to meet you!

## 2020-11-16 NOTE — Assessment & Plan Note (Signed)
I do think her sciatic nerve symptoms are coming from piriformis syndrome as she has significant improvement with figure-of-four stretching.  We will place her on home exercise program including stretches, leg abduction and posterior raise.  See her back in 3 weeks.

## 2020-11-16 NOTE — Progress Notes (Signed)
  Kimberly Escobar - 65 y.o. female MRN [id number]  Date of birth: [birth year]    SUBJECTIVE:      Chief Complaint:/ HPI:  Right-sided buttock and leg pain occasionally radiating down to her right middle toe.  Has had intermittent symptoms of right buttock pain with radiation down the posterior thigh for several months.  She usually does some stretching exercises and this improves.  In the last 2 weeks she has had increase in symptoms.  She still gets relief with stretches but the episodes are happening more often.  They are also more frequently going to the middle toe with some numbness.  She has had no falls.  #2.  Right shoulder pain.  Diffuse, bothers her mostly at night when she tries to sleep on that side and when she tries to do something above shoulder height.  She has not been dropping anything and does not have any numbness in her hand and has not noticed any weakness in the hand.    OBJECTIVE: BP 106/80   Ht 5\' 5"  (1.651 m)   Wt 177 lb (80.3 kg)   BMI 29.45 kg/m   Physical Exam:  Vital signs are reviewed. GENERAL: Well-developed female no acute distress SHOULDERS: Symmetrical.  Right shoulder has full range of motion in all planes the rotator cuff.  She has some mild pain with resisted supraspinatus testing.  Bicep tendon is nontender. BACK: Nontender to palpation.  The area she points to for where the pain starts is right over the sacroiliac joint.  She has pain if I compress the piriformis. EXTREMITY: Lower extremity strength 5 out of 5 flexion extension at the hip knee ankle.  Normal strength in dorsiflexion plantarflexion. NEURO: DTRs bilateral knee and ankle 2+ symmetrical.  Intact sensation to soft touch bilateral feet. SKIN: Skin of her feet is without any sign of erythema, no lesions.  No edema.  ASSESSMENT & PLAN:  See problem based charting & AVS for pt instructions. Piriformis syndrome of right side I do think her sciatic nerve symptoms are coming from piriformis  syndrome as she has significant improvement with figure-of-four stretching.  We will place her on home exercise program including stretches, leg abduction and posterior raise.  See her back in 3 weeks.  Rotator cuff syndrome of right shoulder Mild impingement.  We will start her with home exercise program and follow-up 3 to 4 weeks.

## 2020-11-16 NOTE — Assessment & Plan Note (Signed)
Mild impingement.  We will start her with home exercise program and follow-up 3 to 4 weeks.

## 2020-11-16 NOTE — Telephone Encounter (Signed)
Looks like patient has appointment on the 15th

## 2020-11-19 ENCOUNTER — Ambulatory Visit (INDEPENDENT_AMBULATORY_CARE_PROVIDER_SITE_OTHER): Payer: Medicare Other | Admitting: Obstetrics & Gynecology

## 2020-11-19 ENCOUNTER — Other Ambulatory Visit: Payer: Self-pay

## 2020-11-19 ENCOUNTER — Encounter: Payer: Self-pay | Admitting: Obstetrics & Gynecology

## 2020-11-19 VITALS — BP 120/76 | HR 73 | Ht 65.0 in | Wt 173.0 lb

## 2020-11-19 DIAGNOSIS — N952 Postmenopausal atrophic vaginitis: Secondary | ICD-10-CM | POA: Diagnosis not present

## 2020-11-19 DIAGNOSIS — Z01419 Encounter for gynecological examination (general) (routine) without abnormal findings: Secondary | ICD-10-CM | POA: Diagnosis not present

## 2020-11-19 NOTE — Progress Notes (Addendum)
Subjective:     Kimberly Escobar is a 65 y.o. female here for a routine exam.  Current complaints: no GYN complaints. Pt has some menopausal atrophy that is well controlled using the coconut oil.   Last colonoscopy was in 2014. Rec repeat in 10 years.    Gynecologic History No LMP recorded. Patient is postmenopausal. Contraception: post menopausal status Last Pap: 07/11/2019. Results were: normal Last mammogram: 09/18/2020. Results were: normal  Obstetric History OB History  Gravida Para Term Preterm AB Living  4 4 3     4   SAB IAB Ectopic Multiple Live Births          4    # Outcome Date GA Lbr Len/2nd Weight Sex Delivery Anes PTL Lv  4 Term 1994 [redacted]w[redacted]d   M CS-Classical EPI  LIV  3 Para 1977 [redacted]w[redacted]d   M CS-Classical EPI  LIV  2 Term 7    M CS-Classical EPI  LIV  1 Term 1975 [redacted]w[redacted]d   M CS-Classical EPI  LIV     The following portions of the patient's history were reviewed and updated as appropriate: allergies, current medications, past family history, past medical history, past social history, past surgical history, and problem list.  Review of Systems Pertinent items are noted in HPI.    Objective:  BP 120/76   Pulse 73   Ht 5\' 5"  (1.651 m)   Wt 173 lb (78.5 kg)   BMI 28.79 kg/m  General Appearance:    Alert, cooperative, no distress, appears stated age  Head:    Normocephalic, without obvious abnormality, atraumatic  Eyes:    conjunctiva/corneas clear, EOM's intact, both eyes  Ears:    Normal external ear canals, both ears  Nose:   Nares normal, septum midline, mucosa normal, no drainage    or sinus tenderness  Throat:   Lips, mucosa, and tongue normal; teeth and gums normal  Neck:   Supple, symmetrical, trachea midline, no adenopathy;    thyroid:  no enlargement/tenderness/nodules  Back:     Symmetric, no curvature, ROM normal, no CVA tenderness  Lungs:     respirations unlabored  Chest Wall:    No tenderness or deformity   Heart:    Regular rate and rhythm  Breast  Exam:    No tenderness, masses, or nipple abnormality  Abdomen:     Soft, non-tender, bowel sounds active all four quadrants,    no masses, no organomegaly  Genitalia:    Normal female without lesion, discharge or tenderness     Extremities:   Extremities normal, atraumatic, no cyanosis or edema  Pulses:   2+ and symmetric all extremities  Skin:   Skin color, texture, turgor normal, no rashes or lesions      Assessment:    Healthy female exam.  Cervical cancer screening. Pt with no h/o abnormal PAPs in the past. We discussed discontinuation of PAP tests.  Atrophic vaginitis- well controlled with the coconut oil.     Plan:  Kimberly Escobar was seen today for gynecologic exam.  Diagnoses and all orders for this visit:  Well female exam with routine gynecological exam  Atrophic vaginitis   F/u in 1 year or sooner prn   Aadin Gaut L. Harraway-Smith, M.D., Cherlynn June
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
## 2020-11-21 ENCOUNTER — Other Ambulatory Visit: Payer: Self-pay

## 2020-11-21 ENCOUNTER — Ambulatory Visit (INDEPENDENT_AMBULATORY_CARE_PROVIDER_SITE_OTHER): Payer: Medicare Other | Admitting: Rheumatology

## 2020-11-21 ENCOUNTER — Encounter: Payer: Self-pay | Admitting: Rheumatology

## 2020-11-21 VITALS — BP 127/84 | HR 79 | Ht 65.0 in | Wt 181.0 lb

## 2020-11-21 DIAGNOSIS — M533 Sacrococcygeal disorders, not elsewhere classified: Secondary | ICD-10-CM

## 2020-11-21 DIAGNOSIS — M8589 Other specified disorders of bone density and structure, multiple sites: Secondary | ICD-10-CM | POA: Diagnosis not present

## 2020-11-21 DIAGNOSIS — M170 Bilateral primary osteoarthritis of knee: Secondary | ICD-10-CM

## 2020-11-21 DIAGNOSIS — E785 Hyperlipidemia, unspecified: Secondary | ICD-10-CM | POA: Diagnosis not present

## 2020-11-21 DIAGNOSIS — M7062 Trochanteric bursitis, left hip: Secondary | ICD-10-CM

## 2020-11-21 DIAGNOSIS — R768 Other specified abnormal immunological findings in serum: Secondary | ICD-10-CM

## 2020-11-21 DIAGNOSIS — M797 Fibromyalgia: Secondary | ICD-10-CM

## 2020-11-21 DIAGNOSIS — M5030 Other cervical disc degeneration, unspecified cervical region: Secondary | ICD-10-CM | POA: Diagnosis not present

## 2020-11-21 DIAGNOSIS — M19041 Primary osteoarthritis, right hand: Secondary | ICD-10-CM

## 2020-11-21 DIAGNOSIS — M19042 Primary osteoarthritis, left hand: Secondary | ICD-10-CM

## 2020-11-21 DIAGNOSIS — G8929 Other chronic pain: Secondary | ICD-10-CM

## 2020-11-21 DIAGNOSIS — G4709 Other insomnia: Secondary | ICD-10-CM

## 2020-11-21 DIAGNOSIS — Z9889 Other specified postprocedural states: Secondary | ICD-10-CM

## 2020-11-21 DIAGNOSIS — M25511 Pain in right shoulder: Secondary | ICD-10-CM

## 2020-11-21 DIAGNOSIS — L819 Disorder of pigmentation, unspecified: Secondary | ICD-10-CM

## 2020-11-21 DIAGNOSIS — Z8669 Personal history of other diseases of the nervous system and sense organs: Secondary | ICD-10-CM

## 2020-11-21 DIAGNOSIS — Z8719 Personal history of other diseases of the digestive system: Secondary | ICD-10-CM

## 2020-11-21 NOTE — Patient Instructions (Addendum)

## 2020-11-21 NOTE — Addendum Note (Signed)
Addended by: Earnestine Mealing on: 11/21/2020 03:24 PM   Modules accepted: Orders

## 2020-11-26 ENCOUNTER — Other Ambulatory Visit: Payer: Self-pay

## 2020-11-27 ENCOUNTER — Ambulatory Visit (INDEPENDENT_AMBULATORY_CARE_PROVIDER_SITE_OTHER): Payer: Medicare Other | Admitting: Family Medicine

## 2020-11-27 ENCOUNTER — Encounter: Payer: Self-pay | Admitting: Family Medicine

## 2020-11-27 VITALS — BP 110/70 | HR 85 | Temp 98.6°F | Resp 18 | Ht 65.0 in | Wt 177.6 lb

## 2020-11-27 DIAGNOSIS — E559 Vitamin D deficiency, unspecified: Secondary | ICD-10-CM | POA: Diagnosis not present

## 2020-11-27 DIAGNOSIS — E538 Deficiency of other specified B group vitamins: Secondary | ICD-10-CM

## 2020-11-27 DIAGNOSIS — Z136 Encounter for screening for cardiovascular disorders: Secondary | ICD-10-CM | POA: Diagnosis not present

## 2020-11-27 DIAGNOSIS — Z0000 Encounter for general adult medical examination without abnormal findings: Secondary | ICD-10-CM

## 2020-11-27 DIAGNOSIS — H6981 Other specified disorders of Eustachian tube, right ear: Secondary | ICD-10-CM

## 2020-11-27 DIAGNOSIS — Z23 Encounter for immunization: Secondary | ICD-10-CM

## 2020-11-27 MED ORDER — FLUTICASONE PROPIONATE 50 MCG/ACT NA SUSP: NASAL | 11 refills | Status: DC
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
## 2020-11-27 NOTE — Assessment & Plan Note (Signed)
ghm utd Check labs  See avs  

## 2020-11-27 NOTE — Patient Instructions (Signed)
Preventive Care 40 Years and Older, Female Preventive care refers to lifestyle choices and visits with your health care provider that can promote health and wellness. Preventive care visits are also called wellness exams. What can I expect for my preventive care visit? Counseling Your health care provider may ask you questions about your: Medical history, including: Past medical problems. Family medical history. Pregnancy and menstrual history. History of falls. Current health, including: Memory and ability to understand (cognition). Emotional well-being. Home life and relationship well-being. Sexual activity and sexual health. Lifestyle, including: Alcohol, nicotine or tobacco, and drug use. Access to firearms. Diet, exercise, and sleep habits. Work and work Statistician. Sunscreen use. Safety issues such as seatbelt and bike helmet use. Physical exam Your health care provider will check your: Height and weight. These may be used to calculate your BMI (body mass index). BMI is a measurement that tells if you are at a healthy weight. Waist circumference. This measures the distance around your waistline. This measurement also tells if you are at a healthy weight and may help predict your risk of certain diseases, such as type 2 diabetes and high blood pressure. Heart rate and blood pressure. Body temperature. Skin for abnormal spots. What immunizations do I need? Vaccines are usually given at various ages, according to a schedule. Your health care provider will recommend vaccines for you based on your age, medical history, and lifestyle or other factors, such as travel or where you work. What tests do I need? Screening Your health care provider may recommend screening tests for certain conditions. This may include: Lipid and cholesterol levels. Hepatitis C test. Hepatitis B test. HIV (human immunodeficiency virus) test. STI (sexually transmitted infection) testing, if you are at  risk. Lung cancer screening. Colorectal cancer screening. Diabetes screening. This is done by checking your blood sugar (glucose) after you have not eaten for a while (fasting). Mammogram. Talk with your health care provider about how often you should have regular mammograms. BRCA-related cancer screening. This may be done if you have a family history of breast, ovarian, tubal, or peritoneal cancers. Bone density scan. This is done to screen for osteoporosis. Talk with your health care provider about your test results, treatment options, and if necessary, the need for more tests. Follow these instructions at home: Eating and drinking  Eat a diet that includes fresh fruits and vegetables, whole grains, lean protein, and low-fat dairy products. Limit your intake of foods with high amounts of sugar, saturated fats, and salt. Take vitamin and mineral supplements as recommended by your health care provider. Do not drink alcohol if your health care provider tells you not to drink. If you drink alcohol: Limit how much you have to 0-1 drink a day. Know how much alcohol is in your drink. In the [place] one drink equals one 12 oz bottle of beer (355 mL), one 5 oz glass of wine (148 mL), or one 1 oz glass of hard liquor (44 mL). Lifestyle Brush your teeth every morning and night with fluoride toothpaste. Floss one time each day. Exercise for at least 30 minutes 5 or more days each week. Do not use any products that contain nicotine or tobacco. These products include cigarettes, chewing tobacco, and vaping devices, such as e-cigarettes. If you need help quitting, ask your health care provider. Do not use drugs. If you are sexually active, practice safe sex. Use a condom or other form of protection in order to prevent STIs. Take aspirin only as told by your  health care provider. Make sure that you understand how much to take and what form to take. Work with your health care provider to find out whether it  is safe and beneficial for you to take aspirin daily. Ask your health care provider if you need to take a cholesterol-lowering medicine (statin). Find healthy ways to manage stress, such as: Meditation, yoga, or listening to music. Journaling. Talking to a trusted person. Spending time with friends and family. Minimize exposure to UV radiation to reduce your risk of skin cancer. Safety Always wear your seat belt while driving or riding in a vehicle. Do not drive: If you have been drinking alcohol. Do not ride with someone who has been drinking. When you are tired or distracted. While texting. If you have been using any mind-altering substances or drugs. Wear a helmet and other protective equipment during sports activities. If you have firearms in your house, make sure you follow all gun safety procedures. What's next? Visit your health care provider once a year for an annual wellness visit. Ask your health care provider how often you should have your eyes and teeth checked. Stay up to date on all vaccines. This information is not intended to replace advice given to you by your health care provider. Make sure you discuss any questions you have with your health care provider. Document Revised: 06/27/2020 Document Reviewed: 06/27/2020 Elsevier Patient Education  [place].

## 2020-11-28 LAB — CBC WITH DIFFERENTIAL/PLATELET
Basophils Absolute: 0.1 10*3/uL (ref 0.0–0.1)
Basophils Relative: 0.9 % (ref 0.0–3.0)
Eosinophils Absolute: 0.1 10*3/uL (ref 0.0–0.7)
Eosinophils Relative: 1.4 % (ref 0.0–5.0)
HCT: 40.1 % (ref 36.0–46.0)
Hemoglobin: 13.3 g/dL (ref 12.0–15.0)
Lymphocytes Relative: 38.7 % (ref 12.0–46.0)
Lymphs Abs: 2.9 10*3/uL (ref 0.7–4.0)
MCHC: 33.1 g/dL (ref 30.0–36.0)
MCV: 96.6 fl (ref 78.0–100.0)
Monocytes Absolute: 0.5 10*3/uL (ref 0.1–1.0)
Monocytes Relative: 7.2 % (ref 3.0–12.0)
Neutro Abs: 3.9 10*3/uL (ref 1.4–7.7)
Neutrophils Relative %: 51.8 % (ref 43.0–77.0)
Platelets: 305 10*3/uL (ref 150.0–400.0)
RBC: 4.15 Mil/uL (ref 3.87–5.11)
RDW: 13.3 % (ref 11.5–15.5)
WBC: 7.5 10*3/uL (ref 4.0–10.5)

## 2020-11-28 LAB — COMPREHENSIVE METABOLIC PANEL
ALT: 22 U/L (ref 0–35)
AST: 9 U/L (ref 0–37)
Albumin: 4.7 g/dL (ref 3.5–5.2)
Alkaline Phosphatase: 79 U/L (ref 39–117)
BUN: 17 mg/dL (ref 6–23)
CO2: 27 mEq/L (ref 19–32)
Calcium: 9.8 mg/dL (ref 8.4–10.5)
Chloride: 105 mEq/L (ref 96–112)
Creatinine, Ser: 0.81 mg/dL (ref 0.40–1.20)
GFR: 75.23 mL/min (ref >60.00)
Glucose, Bld: 88 mg/dL (ref 70–99)
Potassium: 4 mEq/L (ref 3.5–5.1)
Sodium: 140 mEq/L (ref 135–145)
Total Bilirubin: 0.3 mg/dL (ref 0.2–1.2)
Total Protein: 7.8 g/dL (ref 6.0–8.3)

## 2020-11-28 LAB — VITAMIN B12: Vitamin B-12: 1167 pg/mL — ABNORMAL HIGH (ref 211–911)

## 2020-11-28 LAB — LIPID PANEL
Cholesterol: 207 mg/dL — ABNORMAL HIGH (ref 0–200)
HDL: 58.7 mg/dL (ref >39.00)
LDL Cholesterol: 116 mg/dL — ABNORMAL HIGH (ref 0–99)
NonHDL: 148.46
Total CHOL/HDL Ratio: 4
Triglycerides: 160 mg/dL — ABNORMAL HIGH (ref 0.0–149.0)
VLDL: 32 mg/dL (ref 0.0–40.0)

## 2020-11-28 LAB — VITAMIN D 25 HYDROXY (VIT D DEFICIENCY, FRACTURES): VITD: 40.83 ng/mL (ref 30.00–100.00)

## 2020-11-30 ENCOUNTER — Other Ambulatory Visit (HOSPITAL_BASED_OUTPATIENT_CLINIC_OR_DEPARTMENT_OTHER): Payer: Self-pay

## 2020-11-30 MED ORDER — MODERNA COVID-19 BIVAL BOOSTER 50 MCG/0.5ML IM SUSP
INTRAMUSCULAR | 0 refills | Status: DC
  Filled 2020-11-30: qty 0.5, 1d supply, fill #0

## 2020-12-01 ENCOUNTER — Other Ambulatory Visit: Payer: Self-pay | Admitting: Family Medicine

## 2020-12-01 DIAGNOSIS — E785 Hyperlipidemia, unspecified: Secondary | ICD-10-CM

## 2020-12-03 ENCOUNTER — Telehealth: Payer: Self-pay

## 2020-12-04 NOTE — Telephone Encounter (Signed)
Pt called back stating she is doing 75% better after having started ibuprofen. She will take that for a few days, along with tylenol when needed. F/u scheduled on 12/14/20 and we'll see how she's doing then.

## 2020-12-11 ENCOUNTER — Encounter: Payer: Self-pay | Admitting: Physical Therapy

## 2020-12-11 ENCOUNTER — Other Ambulatory Visit: Payer: Self-pay

## 2020-12-11 ENCOUNTER — Ambulatory Visit: Payer: Medicare Other | Attending: Rheumatology | Admitting: Physical Therapy

## 2020-12-11 DIAGNOSIS — M25651 Stiffness of right hip, not elsewhere classified: Secondary | ICD-10-CM | POA: Diagnosis not present

## 2020-12-11 DIAGNOSIS — M6281 Muscle weakness (generalized): Secondary | ICD-10-CM | POA: Diagnosis not present

## 2020-12-11 DIAGNOSIS — R262 Difficulty in walking, not elsewhere classified: Secondary | ICD-10-CM | POA: Insufficient documentation

## 2020-12-11 DIAGNOSIS — M7062 Trochanteric bursitis, left hip: Secondary | ICD-10-CM | POA: Insufficient documentation

## 2020-12-11 DIAGNOSIS — M25511 Pain in right shoulder: Secondary | ICD-10-CM | POA: Insufficient documentation

## 2020-12-11 DIAGNOSIS — G8929 Other chronic pain: Secondary | ICD-10-CM | POA: Insufficient documentation

## 2020-12-11 DIAGNOSIS — M25551 Pain in right hip: Secondary | ICD-10-CM | POA: Insufficient documentation

## 2020-12-11 NOTE — Addendum Note (Signed)
Addended by: Colbert Ewing MARIE L on: 12/11/2020 09:57 AM   Modules accepted: Orders

## 2020-12-11 NOTE — Patient Instructions (Signed)
Access Code: [email or web address] URL: [email or web address] Date: 12/11/2020 Prepared by: Estill Bamberg April Thurnell Garbe  Exercises Seated Figure 4 Piriformis Stretch - 1 x daily - 7 x weekly - 2 sets - 30 sec hold Seated Piriformis Stretch - 1 x daily - 7 x weekly - 3 sets - 10 reps Supine Figure 4 Piriformis Stretch - 1 x daily - 7 x weekly - 2 sets - 30 sec hold Clamshell with Resistance - 1 x daily - 7 x weekly - 2 sets - 10 reps Supine Bridge - 1 x daily - 7 x weekly - 2 sets - 10 reps - 2 sec hold Seated March with Resistance - 1 x daily - 7 x weekly - 2 sets - 10 reps
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
## 2020-12-14 ENCOUNTER — Ambulatory Visit: Payer: Medicare Other | Admitting: Family Medicine

## 2020-12-18 ENCOUNTER — Other Ambulatory Visit: Payer: Self-pay

## 2020-12-18 ENCOUNTER — Encounter: Payer: Self-pay | Admitting: Physical Therapy

## 2020-12-18 ENCOUNTER — Ambulatory Visit: Payer: Medicare Other | Attending: Rheumatology | Admitting: Physical Therapy

## 2020-12-18 DIAGNOSIS — M25551 Pain in right hip: Secondary | ICD-10-CM | POA: Insufficient documentation

## 2020-12-18 DIAGNOSIS — M25651 Stiffness of right hip, not elsewhere classified: Secondary | ICD-10-CM | POA: Diagnosis not present

## 2020-12-18 DIAGNOSIS — R262 Difficulty in walking, not elsewhere classified: Secondary | ICD-10-CM | POA: Diagnosis not present

## 2020-12-18 DIAGNOSIS — M6281 Muscle weakness (generalized): Secondary | ICD-10-CM | POA: Insufficient documentation
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
## 2020-12-18 NOTE — Patient Instructions (Signed)
Access Code: [email or web address] URL: [email or web address] Date: 12/18/2020 Prepared by: Almyra Free  Exercises Seated Figure 4 Piriformis Stretch - 1 x daily - 7 x weekly - 2 sets - 30 sec hold Seated Piriformis Stretch - 1 x daily - 7 x weekly - 3 sets - 10 reps Supine Piriformis Stretch - 2 x daily - 7 x weekly - 1 sets - 3 reps - 30-60 sec hold Clamshell with Resistance - 1 x daily - 7 x weekly - 2 sets - 10 reps Supine Bridge - 1 x daily - 7 x weekly - 2 sets - 10 reps - 2 sec hold Seated March with Resistance - 1 x daily - 7 x weekly - 2 sets - 10 reps  Patient Education Trigger Point Dry Needling

## 2020-12-20 ENCOUNTER — Encounter: Payer: Medicare Other | Admitting: Physical Therapy

## 2020-12-20 DIAGNOSIS — H25813 Combined forms of age-related cataract, bilateral: Secondary | ICD-10-CM | POA: Diagnosis not present

## 2020-12-20 DIAGNOSIS — H524 Presbyopia: Secondary | ICD-10-CM | POA: Diagnosis not present

## 2020-12-20 DIAGNOSIS — H5203 Hypermetropia, bilateral: Secondary | ICD-10-CM | POA: Diagnosis not present

## 2020-12-21 ENCOUNTER — Telehealth: Payer: Self-pay

## 2020-12-21 NOTE — Telephone Encounter (Signed)
Patient called stating at her last appointment Dr. Estanislado Pandy offered to give her an injection in her back, but she wanted to try physical therapy first.  Patient states she is attending PT 1 time/week, but is in so much pain she has decided she does want the injection.  Please advise.

## 2020-12-24 ENCOUNTER — Encounter: Payer: Self-pay | Admitting: Physical Therapy

## 2020-12-24 ENCOUNTER — Other Ambulatory Visit: Payer: Self-pay

## 2020-12-24 ENCOUNTER — Ambulatory Visit: Payer: Medicare Other | Admitting: Physical Therapy

## 2020-12-24 DIAGNOSIS — M6281 Muscle weakness (generalized): Secondary | ICD-10-CM | POA: Diagnosis not present

## 2020-12-24 DIAGNOSIS — R262 Difficulty in walking, not elsewhere classified: Secondary | ICD-10-CM | POA: Diagnosis not present

## 2020-12-24 DIAGNOSIS — M25551 Pain in right hip: Secondary | ICD-10-CM | POA: Diagnosis not present

## 2020-12-24 DIAGNOSIS — M25651 Stiffness of right hip, not elsewhere classified: Secondary | ICD-10-CM

## 2020-12-24 NOTE — Patient Instructions (Signed)
Access Code: [email or web address] URL: [email or web address] Date: 12/24/2020 Prepared by: Almyra Free  Exercises Seated Figure 4 Piriformis Stretch - 1 x daily - 7 x weekly - 2 sets - 30 sec hold Seated Piriformis Stretch - 1 x daily - 7 x weekly - 3 sets - 10 reps Supine Piriformis Stretch - 2 x daily - 7 x weekly - 1 sets - 3 reps - 30-60 sec hold Clamshell with Resistance - 1 x daily - 7 x weekly - 2 sets - 10 reps Supine Bridge - 1 x daily - 7 x weekly - 2 sets - 10 reps - 2 sec hold Seated March with Resistance - 1 x daily - 7 x weekly - 2 sets - 10 reps Supine Sciatic Nerve Glide (Mirrored) - 1 x daily - 7 x weekly - 1 sets - 10 reps - 5 sec hold Supine Quadriceps Stretch with Strap on Table (Mirrored) - 2 x daily - 7 x weekly - 1 sets - 2-33 reps - 60 sec hold
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
## 2020-12-25 NOTE — Telephone Encounter (Signed)
Patient states she doesn't need the appointment anymore.   She is getting dry needling at PT and it is helping with her back pain.

## 2020-12-27 ENCOUNTER — Encounter: Payer: Medicare Other | Admitting: Physical Therapy

## 2020-12-31 ENCOUNTER — Ambulatory Visit: Payer: Medicare Other | Admitting: Physical Therapy

## 2021-01-03 ENCOUNTER — Encounter: Payer: Medicare Other | Admitting: Physical Therapy

## 2021-01-09 ENCOUNTER — Ambulatory Visit: Payer: Medicare Other | Admitting: Physical Therapy

## 2021-01-10 ENCOUNTER — Ambulatory Visit (INDEPENDENT_AMBULATORY_CARE_PROVIDER_SITE_OTHER): Payer: Medicare Other | Admitting: Physician Assistant

## 2021-01-10 ENCOUNTER — Other Ambulatory Visit: Payer: Self-pay

## 2021-01-10 VITALS — BP 132/82 | HR 88

## 2021-01-10 DIAGNOSIS — M7061 Trochanteric bursitis, right hip: Secondary | ICD-10-CM | POA: Diagnosis not present

## 2021-01-10 MED ORDER — TRIAMCINOLONE ACETONIDE 40 MG/ML IJ SUSP
40.0000 mg | INTRAMUSCULAR | Status: AC | PRN
  Administered 2021-01-10: 40 mg via INTRA_ARTICULAR

## 2021-01-10 MED ORDER — LIDOCAINE HCL 1 % IJ SOLN
1.5000 mL | INTRAMUSCULAR | Status: AC | PRN
  Administered 2021-01-10: 1.5 mL

## 2021-01-10 NOTE — Progress Notes (Signed)
° °  Procedure Note  Patient: Kimberly Escobar             Date of Birth: [birth year]           MRN: [id number]             Visit Date: 01/10/2021  Procedures: Visit Diagnoses:  1. Trochanteric bursitis, right hip     Large Joint Inj: R greater trochanter on 01/10/2021 9:00 AM Indications: pain Details: 27 G 1.5 in needle, lateral approach  Arthrogram: No  Medications: 1.5 mL lidocaine 1 %; 40 mg triamcinolone acetonide 40 MG/ML Aspirate: 0 mL Outcome: tolerated well, no immediate complications Procedure, treatment alternatives, risks and benefits explained, specific risks discussed. Consent was given by the patient. Immediately prior to procedure a time out was called to verify the correct patient, procedure, equipment, support staff and site/side marked as required. Patient was prepped and draped in the usual sterile fashion.     Patient tolerated the procedure well.  Aftercare was discussed.  Hazel Sams, PA-C

## 2021-01-11 ENCOUNTER — Encounter: Payer: Medicare Other | Admitting: Physical Therapy

## 2021-01-15 ENCOUNTER — Encounter: Payer: Medicare Other | Admitting: Physical Therapy

## 2021-01-16 ENCOUNTER — Other Ambulatory Visit: Payer: Self-pay | Admitting: Family Medicine

## 2021-01-16 DIAGNOSIS — R600 Localized edema: Secondary | ICD-10-CM

## 2021-01-17 ENCOUNTER — Other Ambulatory Visit: Payer: Self-pay

## 2021-01-17 ENCOUNTER — Encounter: Payer: Self-pay | Admitting: Nurse Practitioner

## 2021-01-17 ENCOUNTER — Ambulatory Visit: Payer: Medicare Other | Admitting: Nurse Practitioner

## 2021-01-17 VITALS — BP 122/80 | HR 85 | Temp 98.7°F | Ht 65.0 in | Wt 179.4 lb

## 2021-01-17 DIAGNOSIS — R051 Acute cough: Secondary | ICD-10-CM

## 2021-01-17 DIAGNOSIS — J029 Acute pharyngitis, unspecified: Secondary | ICD-10-CM

## 2021-01-17 MED ORDER — AZITHROMYCIN 250 MG PO TABS: ORAL_TABLET | ORAL | 0 refills | Status: AC

## 2021-01-17 MED ORDER — BENZONATATE 100 MG PO CAPS: 100.0000 mg | ORAL_CAPSULE | Freq: Two times a day (BID) | ORAL | 0 refills | Status: DC | PRN
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
## 2021-01-18 LAB — POCT RAPID STREP A (OFFICE): Rapid Strep A Screen: NEGATIVE

## 2021-02-05 ENCOUNTER — Ambulatory Visit: Payer: Medicare Other | Admitting: Family Medicine

## 2021-02-07 ENCOUNTER — Ambulatory Visit (INDEPENDENT_AMBULATORY_CARE_PROVIDER_SITE_OTHER): Payer: Medicare Other | Admitting: Family Medicine

## 2021-02-07 ENCOUNTER — Encounter: Payer: Self-pay | Admitting: Family Medicine

## 2021-02-07 VITALS — BP 108/80 | HR 86 | Temp 98.5°F | Resp 18 | Ht 65.0 in | Wt 179.2 lb

## 2021-02-07 DIAGNOSIS — J029 Acute pharyngitis, unspecified: Secondary | ICD-10-CM

## 2021-02-07 DIAGNOSIS — H6981 Other specified disorders of Eustachian tube, right ear: Secondary | ICD-10-CM

## 2021-02-07 DIAGNOSIS — R051 Acute cough: Secondary | ICD-10-CM

## 2021-02-07 DIAGNOSIS — J40 Bronchitis, not specified as acute or chronic: Secondary | ICD-10-CM | POA: Insufficient documentation

## 2021-02-07 MED ORDER — FLUTICASONE PROPIONATE 50 MCG/ACT NA SUSP: NASAL | 11 refills | Status: DC

## 2021-02-07 MED ORDER — BENZONATATE 100 MG PO CAPS: 100.0000 mg | ORAL_CAPSULE | Freq: Two times a day (BID) | ORAL | 0 refills | Status: DC | PRN

## 2021-02-07 MED ORDER — PREDNISONE 10 MG PO TABS: ORAL_TABLET | ORAL | 0 refills | Status: DC

## 2021-02-07 NOTE — Assessment & Plan Note (Signed)
Take z pak given previously pred taper Refill tessalon perles  Use flonase  F/u prn
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
## 2021-02-07 NOTE — Patient Instructions (Signed)
Acute Bronchitis, Adult °Acute bronchitis is sudden inflammation of the main airways (bronchi) that come off the windpipe (trachea) in the lungs. The swelling causes the airways to get smaller and make more mucus than normal. This can make it hard to breathe and can cause coughing or noisy breathing (wheezing). °Acute bronchitis may last several weeks. The cough may last longer. Allergies, asthma, and exposure to smoke may make the condition worse. °What are the causes? °This condition can be caused by germs and by substances that irritate the lungs, including: °Cold and flu viruses. The most common cause of this condition is the virus that causes the common cold. °Bacteria. This is less common. °Breathing in substances that irritate the lungs, including: °Smoke from cigarettes and other forms of tobacco. °Dust and pollen. °Fumes from household cleaning products, gases, or burned fuel. °Indoor or outdoor air pollution. °What increases the risk? °The following factors may make you more likely to develop this condition: °A weak body's defense system, also called the immune system. °A condition that affects your lungs and breathing, such as asthma. °What are the signs or symptoms? °Common symptoms of this condition include: °Coughing. This may bring up clear, yellow, or green mucus from your lungs (sputum). °Wheezing. °Runny or stuffy nose. °Having too much mucus in your lungs (chest congestion). °Shortness of breath. °Aches and pains, including sore throat or chest. °How is this diagnosed? °This condition is usually diagnosed based on: °Your symptoms and medical history. °A physical exam. °You may also have other tests, including tests to rule out other conditions, such as pneumonia. These tests include: °A test of lung function. °Test of a mucus sample to look for the presence of bacteria. °Tests to check the oxygen level in your blood. °Blood tests. °Chest X-ray. °How is this treated? °Most cases of acute bronchitis  clear up over time without treatment. Your health care provider may recommend: °Drinking more fluids to help thin your mucus so it is easier to cough up. °Taking inhaled medicine (inhaler) to improve air flow in and out of your lungs. °Using a vaporizer or a humidifier. These are machines that add water to the air to help you breathe better. °Taking a medicine that thins mucus and clears congestion (expectorant). °Taking a medicine that prevents or stops coughing (cough suppressant). °It is notcommon to take an antibiotic medicine for this condition. °Follow these instructions at home: ° °Take over-the-counter and prescription medicines only as told by your health care provider. °Use an inhaler, vaporizer, or humidifier as told by your health care provider. °Take two teaspoons (10 mL) of honey at bedtime to lessen coughing at night. °Drink enough fluid to keep your urine pale yellow. °Do not use any products that contain nicotine or tobacco. These products include cigarettes, chewing tobacco, and vaping devices, such as e-cigarettes. If you need help quitting, ask your health care provider. °Get plenty of rest. °Return to your normal activities as told by your health care provider. Ask your health care provider what activities are safe for you. °Keep all follow-up visits. This is important. °How is this prevented? °To lower your risk of getting this condition again: °Wash your hands often with soap and water for at least 20 seconds. If soap and water are not available, use hand sanitizer. °Avoid contact with people who have cold symptoms. °Try not to touch your mouth, nose, or eyes with your hands. °Avoid breathing in smoke or chemical fumes. Breathing smoke or chemical fumes will make your condition   worse. °Get the flu shot every year. °Contact a health care provider if: °Your symptoms do not improve after 2 weeks. °You have trouble coughing up the mucus. °Your cough keeps you awake at night. °You have a  fever. °Get help right away if you: °Cough up blood. °Feel pain in your chest. °Have severe shortness of breath. °Faint or keep feeling like you are going to faint. °Have a severe headache. °Have a fever or chills that get worse. °These symptoms may represent a serious problem that is an emergency. Do not wait to see if the symptoms will go away. Get medical help right away. Call your local emergency services (911 in the [place] Do not drive yourself to the hospital. °Summary °Acute bronchitis is inflammation of the main airways (bronchi) that come off the windpipe (trachea) in the lungs. The swelling causes the airways to get smaller and make more mucus than normal. °Drinking more fluids can help thin your mucus so it is easier to cough up. °Take over-the-counter and prescription medicines only as told by your health care provider. °Do not use any products that contain nicotine or tobacco. These products include cigarettes, chewing tobacco, and vaping devices, such as e-cigarettes. If you need help quitting, ask your health care provider. °Contact a health care provider if your symptoms do not improve after 2 weeks. °This information is not intended to replace advice given to you by your health care provider. Make sure you discuss any questions you have with your health care provider. °Document Revised: 05/02/2020 Document Reviewed: 05/02/2020 °Elsevier Patient Education © 2022 [employer] ° °

## 2021-02-20 ENCOUNTER — Other Ambulatory Visit: Payer: Self-pay

## 2021-02-20 ENCOUNTER — Ambulatory Visit (HOSPITAL_BASED_OUTPATIENT_CLINIC_OR_DEPARTMENT_OTHER)
Admission: RE | Admit: 2021-02-20 | Discharge: 2021-02-20 | Disposition: A | Discharge status: Home / Self Care | Payer: Medicare Other | Source: Ambulatory Visit | Attending: Family Medicine | Admitting: Family Medicine

## 2021-02-20 DIAGNOSIS — R059 Cough, unspecified: Secondary | ICD-10-CM | POA: Diagnosis not present

## 2021-02-20 DIAGNOSIS — R051 Acute cough: Secondary | ICD-10-CM | POA: Diagnosis not present

## 2021-03-21 ENCOUNTER — Telehealth: Payer: Self-pay | Admitting: Rheumatology

## 2021-03-21 NOTE — Telephone Encounter (Signed)
Patient left a message stating she was having pain in her buttocks down to her toes. Patient states she would like an injection for the pain. Please advise. ?

## 2021-03-21 NOTE — Telephone Encounter (Signed)
Patient states she the pain has returned to her right side. Patient states the pain is on and office. Patient advised her last injection was on 01/10/2021 so she would not be able to get another one until the end of March 2023. Patient expressed understanding and scheduled appointment for 04/10/2021.  ?

## 2021-03-21 NOTE — Telephone Encounter (Signed)
Attempted to contact the patient and left message for patient to call the office.  
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
## 2021-04-10 ENCOUNTER — Ambulatory Visit: Payer: Medicare Other | Admitting: Physician Assistant

## 2021-04-10 ENCOUNTER — Encounter: Payer: Self-pay | Admitting: Physician Assistant

## 2021-04-10 ENCOUNTER — Other Ambulatory Visit: Payer: Self-pay

## 2021-04-10 VITALS — BP 130/85 | HR 77 | Ht 65.0 in | Wt 181.2 lb

## 2021-04-10 DIAGNOSIS — M8589 Other specified disorders of bone density and structure, multiple sites: Secondary | ICD-10-CM | POA: Diagnosis not present

## 2021-04-10 DIAGNOSIS — M5030 Other cervical disc degeneration, unspecified cervical region: Secondary | ICD-10-CM | POA: Diagnosis not present

## 2021-04-10 DIAGNOSIS — Z8669 Personal history of other diseases of the nervous system and sense organs: Secondary | ICD-10-CM | POA: Diagnosis not present

## 2021-04-10 DIAGNOSIS — H04553 Acquired stenosis of bilateral nasolacrimal duct: Secondary | ICD-10-CM | POA: Diagnosis not present

## 2021-04-10 DIAGNOSIS — G8929 Other chronic pain: Secondary | ICD-10-CM

## 2021-04-10 DIAGNOSIS — M533 Sacrococcygeal disorders, not elsewhere classified: Secondary | ICD-10-CM | POA: Diagnosis not present

## 2021-04-10 DIAGNOSIS — G4709 Other insomnia: Secondary | ICD-10-CM

## 2021-04-10 DIAGNOSIS — M25511 Pain in right shoulder: Secondary | ICD-10-CM | POA: Diagnosis not present

## 2021-04-10 DIAGNOSIS — M19042 Primary osteoarthritis, left hand: Secondary | ICD-10-CM

## 2021-04-10 DIAGNOSIS — M19041 Primary osteoarthritis, right hand: Secondary | ICD-10-CM

## 2021-04-10 DIAGNOSIS — R7689 Other specified abnormal immunological findings in serum: Secondary | ICD-10-CM

## 2021-04-10 DIAGNOSIS — E785 Hyperlipidemia, unspecified: Secondary | ICD-10-CM | POA: Diagnosis not present

## 2021-04-10 DIAGNOSIS — M797 Fibromyalgia: Secondary | ICD-10-CM | POA: Diagnosis not present

## 2021-04-10 DIAGNOSIS — M170 Bilateral primary osteoarthritis of knee: Secondary | ICD-10-CM | POA: Diagnosis not present

## 2021-04-10 DIAGNOSIS — Z9889 Other specified postprocedural states: Secondary | ICD-10-CM

## 2021-04-10 DIAGNOSIS — R768 Other specified abnormal immunological findings in serum: Secondary | ICD-10-CM

## 2021-04-10 DIAGNOSIS — H04223 Epiphora due to insufficient drainage, bilateral lacrimal glands: Secondary | ICD-10-CM | POA: Diagnosis not present

## 2021-04-10 DIAGNOSIS — Z8719 Personal history of other diseases of the digestive system: Secondary | ICD-10-CM

## 2021-04-10 DIAGNOSIS — M7061 Trochanteric bursitis, right hip: Secondary | ICD-10-CM | POA: Diagnosis not present

## 2021-04-10 MED ORDER — TRIAMCINOLONE ACETONIDE 40 MG/ML IJ SUSP
40.0000 mg | INTRAMUSCULAR | Status: AC | PRN
  Administered 2021-04-10: 40 mg via INTRA_ARTICULAR

## 2021-04-10 MED ORDER — LIDOCAINE HCL 1 % IJ SOLN
1.5000 mL | INTRAMUSCULAR | Status: AC | PRN
  Administered 2021-04-10: 1.5 mL

## 2021-04-11 ENCOUNTER — Telehealth: Payer: Self-pay | Admitting: Family Medicine

## 2021-04-11 DIAGNOSIS — J029 Acute pharyngitis, unspecified: Secondary | ICD-10-CM

## 2021-04-11 DIAGNOSIS — R051 Acute cough: Secondary | ICD-10-CM

## 2021-04-11 MED ORDER — BENZONATATE 100 MG PO CAPS: 100.0000 mg | ORAL_CAPSULE | Freq: Two times a day (BID) | ORAL | 0 refills | Status: DC | PRN

## 2021-04-11 NOTE — Telephone Encounter (Signed)
Pt seen in January for cough and bronchitis... Okay to refill or OV? ?

## 2021-04-11 NOTE — Telephone Encounter (Signed)
Refill sent.

## 2021-04-11 NOTE — Telephone Encounter (Signed)
Medication: benzonatate (TESSALON) 100 MG capsule  ? ?Has the patient contacted their pharmacy? No. ? ? ?Preferred Pharmacy: [id number] - [place], [place] - [address] AT [address]  ?[address] [place], [place] [address]  ?Phone:  [phone]  Fax:  [phone] ? ? ?

## 2021-04-16 ENCOUNTER — Ambulatory Visit (INDEPENDENT_AMBULATORY_CARE_PROVIDER_SITE_OTHER): Payer: Medicare Other | Admitting: Family Medicine

## 2021-04-16 VITALS — BP 130/78 | HR 91 | Temp 98.4°F | Resp 16 | Ht 65.0 in | Wt 180.4 lb

## 2021-04-16 DIAGNOSIS — J40 Bronchitis, not specified as acute or chronic: Secondary | ICD-10-CM | POA: Diagnosis not present

## 2021-04-16 DIAGNOSIS — R053 Chronic cough: Secondary | ICD-10-CM | POA: Diagnosis not present

## 2021-04-16 MED ORDER — PREDNISONE 10 MG PO TABS: ORAL_TABLET | ORAL | 0 refills | Status: DC

## 2021-04-16 MED ORDER — LORATADINE 10 MG PO TABS: 10.0000 mg | ORAL_TABLET | Freq: Every day | ORAL | 11 refills | Status: DC

## 2021-04-16 MED ORDER — FAMOTIDINE 20 MG PO TABS: 20.0000 mg | ORAL_TABLET | Freq: Two times a day (BID) | ORAL | 2 refills | Status: DC

## 2021-04-16 NOTE — Patient Instructions (Signed)
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
## 2021-04-28 ENCOUNTER — Encounter: Payer: Self-pay | Admitting: Family Medicine

## 2021-04-28 NOTE — Assessment & Plan Note (Signed)
With chronic cough ?pred taper  ? ?
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
## 2021-05-22 ENCOUNTER — Encounter: Payer: Self-pay | Admitting: Physician Assistant

## 2021-05-22 ENCOUNTER — Institutional Professional Consult (permissible substitution): Payer: Medicare Other | Admitting: Pulmonary Disease

## 2021-05-22 ENCOUNTER — Ambulatory Visit (INDEPENDENT_AMBULATORY_CARE_PROVIDER_SITE_OTHER): Payer: Medicare Other | Admitting: Physician Assistant

## 2021-05-22 ENCOUNTER — Ambulatory Visit (INDEPENDENT_AMBULATORY_CARE_PROVIDER_SITE_OTHER): Payer: Medicare Other

## 2021-05-22 VITALS — BP 138/81 | HR 75 | Ht 65.0 in | Wt 181.2 lb

## 2021-05-22 DIAGNOSIS — M19042 Primary osteoarthritis, left hand: Secondary | ICD-10-CM

## 2021-05-22 DIAGNOSIS — M25511 Pain in right shoulder: Secondary | ICD-10-CM | POA: Diagnosis not present

## 2021-05-22 DIAGNOSIS — G4709 Other insomnia: Secondary | ICD-10-CM

## 2021-05-22 DIAGNOSIS — M797 Fibromyalgia: Secondary | ICD-10-CM | POA: Diagnosis not present

## 2021-05-22 DIAGNOSIS — E785 Hyperlipidemia, unspecified: Secondary | ICD-10-CM

## 2021-05-22 DIAGNOSIS — M170 Bilateral primary osteoarthritis of knee: Secondary | ICD-10-CM | POA: Diagnosis not present

## 2021-05-22 DIAGNOSIS — M5030 Other cervical disc degeneration, unspecified cervical region: Secondary | ICD-10-CM

## 2021-05-22 DIAGNOSIS — G8929 Other chronic pain: Secondary | ICD-10-CM

## 2021-05-22 DIAGNOSIS — M7062 Trochanteric bursitis, left hip: Secondary | ICD-10-CM | POA: Diagnosis not present

## 2021-05-22 DIAGNOSIS — M25562 Pain in left knee: Secondary | ICD-10-CM | POA: Diagnosis not present

## 2021-05-22 DIAGNOSIS — M533 Sacrococcygeal disorders, not elsewhere classified: Secondary | ICD-10-CM | POA: Diagnosis not present

## 2021-05-22 DIAGNOSIS — Z8719 Personal history of other diseases of the digestive system: Secondary | ICD-10-CM

## 2021-05-22 DIAGNOSIS — M19041 Primary osteoarthritis, right hand: Secondary | ICD-10-CM

## 2021-05-22 DIAGNOSIS — Z9889 Other specified postprocedural states: Secondary | ICD-10-CM

## 2021-05-22 DIAGNOSIS — M8589 Other specified disorders of bone density and structure, multiple sites: Secondary | ICD-10-CM

## 2021-05-22 DIAGNOSIS — M25561 Pain in right knee: Secondary | ICD-10-CM

## 2021-05-22 DIAGNOSIS — R768 Other specified abnormal immunological findings in serum: Secondary | ICD-10-CM | POA: Diagnosis not present

## 2021-05-22 DIAGNOSIS — M7061 Trochanteric bursitis, right hip: Secondary | ICD-10-CM

## 2021-05-22 DIAGNOSIS — Z8669 Personal history of other diseases of the nervous system and sense organs: Secondary | ICD-10-CM

## 2021-05-22 MED ORDER — TRIAMCINOLONE ACETONIDE 40 MG/ML IJ SUSP
40.0000 mg | INTRAMUSCULAR | Status: AC | PRN
  Administered 2021-05-22: 40 mg via INTRA_ARTICULAR

## 2021-05-22 MED ORDER — LIDOCAINE HCL 1 % IJ SOLN
1.5000 mL | INTRAMUSCULAR | Status: AC | PRN
  Administered 2021-05-22: 1.5 mL

## 2021-05-22 NOTE — Patient Instructions (Signed)
Knee Exercises ?Ask your health care provider which exercises are safe for you. Do exercises exactly as told by your health care provider and adjust them as directed. It is normal to feel mild stretching, pulling, tightness, or discomfort as you do these exercises. Stop right away if you feel sudden pain or your pain gets worse. Do not begin these exercises until told by your health care provider. ?Stretching and range-of-motion exercises ?These exercises warm up your muscles and joints and improve the movement and flexibility of your knee. These exercises also help to relieve pain and swelling. ?Knee extension, prone ? ?Lie on your abdomen (prone position) on a bed. ?Place your left / right knee just beyond the edge of the surface so your knee is not on the bed. You can put a towel under your left / right thigh just above your kneecap for comfort. ?Relax your leg muscles and allow gravity to straighten your knee (extension). You should feel a stretch behind your left / right knee. ?Hold this position for __________ seconds. ?Scoot up so your knee is supported between repetitions. ?Repeat __________ times. Complete this exercise __________ times a day. ?Knee flexion, active ? ?Lie on your back with both legs straight. If this causes back discomfort, bend your left / right knee so your foot is flat on the floor. ?Slowly slide your left / right heel back toward your buttocks. Stop when you feel a gentle stretch in the front of your knee or thigh (flexion). ?Hold this position for __________ seconds. ?Slowly slide your left / right heel back to the starting position. ?Repeat __________ times. Complete this exercise __________ times a day. ?Quadriceps stretch, prone ? ?Lie on your abdomen on a firm surface, such as a bed or padded floor. ?Bend your left / right knee and hold your ankle. If you cannot reach your ankle or pant leg, loop a belt around your foot and grab the belt instead. ?Gently pull your heel toward your  buttocks. Your knee should not slide out to the side. You should feel a stretch in the front of your thigh and knee (quadriceps). ?Hold this position for __________ seconds. ?Repeat __________ times. Complete this exercise __________ times a day. ?Hamstring, supine ? ?Lie on your back (supine position). ?Loop a belt or towel over the ball of your left / right foot. The ball of your foot is on the walking surface, right under your toes. ?Straighten your left / right knee and slowly pull on the belt to raise your leg until you feel a gentle stretch behind your knee (hamstring). ?Do not let your knee bend while you do this. ?Keep your other leg flat on the floor. ?Hold this position for __________ seconds. ?Repeat __________ times. Complete this exercise __________ times a day. ?Strengthening exercises ?These exercises build strength and endurance in your knee. Endurance is the ability to use your muscles for a long time, even after they get tired. ?Quadriceps, isometric ?This exercise strengthens the muscles in front of your thigh (quadriceps) without moving your knee joint (isometric). ?Lie on your back with your left / right leg extended and your other knee bent. Put a rolled towel or small pillow under your knee if told by your health care provider. ?Slowly tense the muscles in the front of your left / right thigh. You should see your kneecap slide up toward your hip or see increased dimpling just above the knee. This motion will push the back of the knee toward the floor. ?  For __________ seconds, hold the muscle as tight as you can without increasing your pain. ?Relax the muscles slowly and completely. ?Repeat __________ times. Complete this exercise __________ times a day. ?Straight leg raises ?This exercise strengthens the muscles in front of your thigh (quadriceps) and the muscles that move your hips (hip flexors). ?Lie on your back with your left / right leg extended and your other knee bent. ?Tense the  muscles in the front of your left / right thigh. You should see your kneecap slide up or see increased dimpling just above the knee. Your thigh may even shake a bit. ?Keep these muscles tight as you raise your leg 4-6 inches (10-15 cm) off the floor. Do not let your knee bend. ?Hold this position for __________ seconds. ?Keep these muscles tense as you lower your leg. ?Relax your muscles slowly and completely after each repetition. ?Repeat __________ times. Complete this exercise __________ times a day. ?Hamstring, isometric ? ?Lie on your back on a firm surface. ?Bend your left / right knee about __________ degrees. ?Dig your left / right heel into the surface as if you are trying to pull it toward your buttocks. Tighten the muscles in the back of your thighs (hamstring) to "dig" as hard as you can without increasing any pain. ?Hold this position for __________ seconds. ?Release the tension gradually and allow your muscles to relax completely for __________ seconds after each repetition. ?Repeat __________ times. Complete this exercise __________ times a day. ?Hamstring curls ?If told by your health care provider, do this exercise while wearing ankle weights. Begin with __________lb / kg weights. Then increase the weight by 1 lb (0.5 kg) increments. Do not wear ankle weights that are more than __________lb / kg. ?Lie on your abdomen with your legs straight. ?Bend your left / right knee as far as you can without feeling pain. Keep your hips flat against the floor. ?Hold this position for __________ seconds. ?Slowly lower your leg to the starting position. ?Repeat __________ times. Complete this exercise __________ times a day. ?Squats ?This exercise strengthens the muscles in front of your thigh and knee (quadriceps). ?Stand in front of a table, with your feet and knees pointing straight ahead. You may rest your hands on the table for balance but not for support. ?Slowly bend your knees and lower your hips like you  are going to sit in a chair. ?Keep your weight over your heels, not over your toes. ?Keep your lower legs upright so they are parallel with the table legs. ?Do not let your hips go lower than your knees. ?Do not bend lower than told by your health care provider. ?If your knee pain increases, do not bend as low. ?Hold the squat position for __________ seconds. ?Slowly push with your legs to return to standing. Do not use your hands to pull yourself to standing. ?Repeat __________ times. Complete this exercise __________ times a day. ?Wall slides ?This exercise strengthens the muscles in front of your thigh and knee (quadriceps). ?Lean your back against a smooth wall or door, and walk your feet out 18-24 inches (46-61 cm) from it. ?Place your feet hip-width apart. ?Slowly slide down the wall or door until your knees bend __________ degrees. Keep your knees over your heels, not over your toes. Keep your knees in line with your hips. ?Hold this position for __________ seconds. ?Repeat __________ times. Complete this exercise __________ times a day. ?Straight leg raises, side-lying ?This exercise strengthens the muscles that rotate   the leg at the hip and move it away from your body (hip abductors). ?Lie on your side with your left / right leg in the top position. Lie so your head, shoulder, knee, and hip line up. You may bend your bottom knee to help you keep your balance. ?Roll your hips slightly forward so your hips are stacked directly over each other and your left / right knee is facing forward. ?Leading with your heel, lift your top leg 4-6 inches (10-15 cm). You should feel the muscles in your outer hip lifting. ?Do not let your foot drift forward. ?Do not let your knee roll toward the ceiling. ?Hold this position for __________ seconds. ?Slowly return your leg to the starting position. ?Let your muscles relax completely after each repetition. ?Repeat __________ times. Complete this exercise __________ times a  day. ?Straight leg raises, prone ?This exercise stretches the muscles that move your hips away from the front of the pelvis (hip extensors). ?Lie on your abdomen on a firm surface. You can put a pillow under yo

## 2021-05-22 NOTE — Progress Notes (Signed)
X-rays of both knees consistent with moderate osteoarthritis and moderate chondromalacia patella.  Please notify the patient.
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
## 2021-06-06 ENCOUNTER — Other Ambulatory Visit (HOSPITAL_BASED_OUTPATIENT_CLINIC_OR_DEPARTMENT_OTHER): Payer: Self-pay | Admitting: Family Medicine

## 2021-06-06 ENCOUNTER — Other Ambulatory Visit: Payer: Self-pay | Admitting: Family Medicine

## 2021-06-06 ENCOUNTER — Other Ambulatory Visit: Payer: Self-pay

## 2021-06-06 ENCOUNTER — Telehealth: Payer: Self-pay

## 2021-06-06 ENCOUNTER — Telehealth (HOSPITAL_BASED_OUTPATIENT_CLINIC_OR_DEPARTMENT_OTHER): Payer: Self-pay

## 2021-06-06 ENCOUNTER — Telehealth: Payer: Self-pay | Admitting: Family Medicine

## 2021-06-06 ENCOUNTER — Ambulatory Visit (INDEPENDENT_AMBULATORY_CARE_PROVIDER_SITE_OTHER): Payer: Medicare Other

## 2021-06-06 VITALS — BP 118/80 | HR 66 | Temp 98.5°F | Resp 16 | Ht 65.0 in | Wt 180.0 lb

## 2021-06-06 DIAGNOSIS — E2839 Other primary ovarian failure: Secondary | ICD-10-CM

## 2021-06-06 DIAGNOSIS — M7989 Other specified soft tissue disorders: Secondary | ICD-10-CM

## 2021-06-06 DIAGNOSIS — Z1231 Encounter for screening mammogram for malignant neoplasm of breast: Secondary | ICD-10-CM

## 2021-06-06 DIAGNOSIS — Z0000 Encounter for general adult medical examination without abnormal findings: Secondary | ICD-10-CM

## 2021-06-06 DIAGNOSIS — Z1211 Encounter for screening for malignant neoplasm of colon: Secondary | ICD-10-CM | POA: Diagnosis not present

## 2021-06-06 NOTE — Telephone Encounter (Signed)
Patient would like her mammogram and bone density scan to be done this year. She would like to know what she needs to do in order to get them done. Please advise.

## 2021-06-06 NOTE — Telephone Encounter (Signed)
Called unable to reach

## 2021-06-06 NOTE — Patient Instructions (Signed)
[name] , Thank you for taking time to come for your Medicare Wellness Visit. I appreciate your ongoing commitment to your health goals. Please review the following plan we discussed and let me know if I can assist you in the future.   Screening recommendations/referrals: Colonoscopy: ordered 06/06/21 Mammogram: 09/18/20 due 09/18/21 Bone Density: 09/12/19 due 09/12/19 Recommended yearly ophthalmology/optometry visit for glaucoma screening and checkup Recommended yearly dental visit for hygiene and checkup  Vaccinations: Influenza vaccine: up to date Pneumococcal vaccine: up to date Tdap vaccine: up to date Shingles vaccine: up to date   Covid-19:completed  Advanced directives: no  Conditions/risks identified: see problem list   Next appointment: Follow up in one year for your annual wellness visit    Preventive Care 15 Years and Older, Female Preventive care refers to lifestyle choices and visits with your health care provider that can promote health and wellness. What does preventive care include? A yearly physical exam. This is also called an annual well check. Dental exams once or twice a year. Routine eye exams. Ask your health care provider how often you should have your eyes checked. Personal lifestyle choices, including: Daily care of your teeth and gums. Regular physical activity. Eating a healthy diet. Avoiding tobacco and drug use. Limiting alcohol use. Practicing safe sex. Taking low-dose aspirin every day. Taking vitamin and mineral supplements as recommended by your health care provider. What happens during an annual well check? The services and screenings done by your health care provider during your annual well check will depend on your age, overall health, lifestyle risk factors, and family history of disease. Counseling  Your health care provider may ask you questions about your: Alcohol use. Tobacco use. Drug use. Emotional well-being. Home and  relationship well-being. Sexual activity. Eating habits. History of falls. Memory and ability to understand (cognition). Work and work Statistician. Reproductive health. Screening  You may have the following tests or measurements: Height, weight, and BMI. Blood pressure. Lipid and cholesterol levels. These may be checked every 5 years, or more frequently if you are over 71 years old. Skin check. Lung cancer screening. You may have this screening every year starting at age 62 if you have a 30-pack-year history of smoking and currently smoke or have quit within the past 15 years. Fecal occult blood test (FOBT) of the stool. You may have this test every year starting at age 83. Flexible sigmoidoscopy or colonoscopy. You may have a sigmoidoscopy every 5 years or a colonoscopy every 10 years starting at age 33. Hepatitis C blood test. Hepatitis B blood test. Sexually transmitted disease (STD) testing. Diabetes screening. This is done by checking your blood sugar (glucose) after you have not eaten for a while (fasting). You may have this done every 1-3 years. Bone density scan. This is done to screen for osteoporosis. You may have this done starting at age 43. Mammogram. This may be done every 1-2 years. Talk to your health care provider about how often you should have regular mammograms. Talk with your health care provider about your test results, treatment options, and if necessary, the need for more tests. Vaccines  Your health care provider may recommend certain vaccines, such as: Influenza vaccine. This is recommended every year. Tetanus, diphtheria, and acellular pertussis (Tdap, Td) vaccine. You may need a Td booster every 10 years. Zoster vaccine. You may need this after age 60. Pneumococcal 13-valent conjugate (PCV13) vaccine. One dose is recommended after age 78. Pneumococcal polysaccharide (PPSV23) vaccine. One dose is  recommended after age 38. Talk to your health care provider  about which screenings and vaccines you need and how often you need them. This information is not intended to replace advice given to you by your health care provider. Make sure you discuss any questions you have with your health care provider. Document Released: 01/26/2015 Document Revised: 09/19/2015 Document Reviewed: 10/31/2014 Elsevier Interactive Patient Education  2017 [place] Prevention in the Home Falls can cause injuries. They can happen to people of all ages. There are many things you can do to make your home safe and to help prevent falls. What can I do on the outside of my home? Regularly fix the edges of walkways and driveways and fix any cracks. Remove anything that might make you trip as you walk through a door, such as a raised step or threshold. Trim any bushes or trees on the path to your home. Use bright outdoor lighting. Clear any walking paths of anything that might make someone trip, such as rocks or tools. Regularly check to see if handrails are loose or broken. Make sure that both sides of any steps have handrails. Any raised decks and porches should have guardrails on the edges. Have any leaves, snow, or ice cleared regularly. Use sand or salt on walking paths during winter. Clean up any spills in your garage right away. This includes oil or grease spills. What can I do in the bathroom? Use night lights. Install grab bars by the toilet and in the tub and shower. Do not use towel bars as grab bars. Use non-skid mats or decals in the tub or shower. If you need to sit down in the shower, use a plastic, non-slip stool. Keep the floor dry. Clean up any water that spills on the floor as soon as it happens. Remove soap buildup in the tub or shower regularly. Attach bath mats securely with double-sided non-slip rug tape. Do not have throw rugs and other things on the floor that can make you trip. What can I do in the bedroom? Use night lights. Make sure  that you have a light by your bed that is easy to reach. Do not use any sheets or blankets that are too big for your bed. They should not hang down onto the floor. Have a firm chair that has side arms. You can use this for support while you get dressed. Do not have throw rugs and other things on the floor that can make you trip. What can I do in the kitchen? Clean up any spills right away. Avoid walking on wet floors. Keep items that you use a lot in easy-to-reach places. If you need to reach something above you, use a strong step stool that has a grab bar. Keep electrical cords out of the way. Do not use floor polish or wax that makes floors slippery. If you must use wax, use non-skid floor wax. Do not have throw rugs and other things on the floor that can make you trip. What can I do with my stairs? Do not leave any items on the stairs. Make sure that there are handrails on both sides of the stairs and use them. Fix handrails that are broken or loose. Make sure that handrails are as long as the stairways. Check any carpeting to make sure that it is firmly attached to the stairs. Fix any carpet that is loose or worn. Avoid having throw rugs at the top or bottom of the stairs. If  you do have throw rugs, attach them to the floor with carpet tape. Make sure that you have a light switch at the top of the stairs and the bottom of the stairs. If you do not have them, ask someone to add them for you. What else can I do to help prevent falls? Wear shoes that: Do not have high heels. Have rubber bottoms. Are comfortable and fit you well. Are closed at the toe. Do not wear sandals. If you use a stepladder: Make sure that it is fully opened. Do not climb a closed stepladder. Make sure that both sides of the stepladder are locked into place. Ask someone to hold it for you, if possible. Clearly mark and make sure that you can see: Any grab bars or handrails. First and last steps. Where the edge of  each step is. Use tools that help you move around (mobility aids) if they are needed. These include: Canes. Walkers. Scooters. Crutches. Turn on the lights when you go into a dark area. Replace any light bulbs as soon as they burn out. Set up your furniture so you have a clear path. Avoid moving your furniture around. If any of your floors are uneven, fix them. If there are any pets around you, be aware of where they are. Review your medicines with your doctor. Some medicines can make you feel dizzy. This can increase your chance of falling. Ask your doctor what other things that you can do to help prevent falls. This information is not intended to replace advice given to you by your health care provider. Make sure you discuss any questions you have with your health care provider. Document Released: 10/26/2008 Document Revised: 06/07/2015 Document Reviewed: 02/03/2014 Elsevier Interactive Patient Education  2017 [employer].

## 2021-06-06 NOTE — Telephone Encounter (Signed)
Pt would like a referral to the vascular due to her swelling in legs,pt is already taking lasix.

## 2021-06-06 NOTE — Telephone Encounter (Signed)
Okay to place orders 

## 2021-06-07 NOTE — Telephone Encounter (Signed)
Pt made aware

## 2021-06-17 DIAGNOSIS — Z1211 Encounter for screening for malignant neoplasm of colon: Secondary | ICD-10-CM | POA: Diagnosis not present

## 2021-06-20 ENCOUNTER — Other Ambulatory Visit: Payer: Self-pay | Admitting: Family Medicine

## 2021-06-20 DIAGNOSIS — Z8619 Personal history of other infectious and parasitic diseases: Secondary | ICD-10-CM

## 2021-06-20 NOTE — Telephone Encounter (Signed)
Medication:  acyclovir (ZOVIRAX) 400 MG tablet  acyclovir ointment (ZOVIRAX) 5 %  Has the patient contacted their pharmacy? Yes.    Preferred Pharmacy (with phone number or street name): [id number] - [place], [place] - [address]  [address] [place], [place] [address]  Phone:  [phone]  Fax:  [phone]   Agent: Please be advised that RX refills may take up to 3 business days. We ask that you follow-up with your pharmacy.

## 2021-06-24 LAB — COLOGUARD: COLOGUARD: NEGATIVE

## 2021-06-27 DIAGNOSIS — H02132 Senile ectropion of right lower eyelid: Secondary | ICD-10-CM | POA: Diagnosis not present

## 2021-06-27 DIAGNOSIS — H04203 Unspecified epiphora, bilateral lacrimal glands: Secondary | ICD-10-CM | POA: Diagnosis not present

## 2021-06-27 DIAGNOSIS — H02135 Senile ectropion of left lower eyelid: Secondary | ICD-10-CM | POA: Diagnosis not present

## 2021-07-18 ENCOUNTER — Ambulatory Visit: Payer: Medicare Other | Admitting: Cardiology

## 2021-08-09 DIAGNOSIS — H04553 Acquired stenosis of bilateral nasolacrimal duct: Secondary | ICD-10-CM | POA: Diagnosis not present

## 2021-08-09 DIAGNOSIS — H04223 Epiphora due to insufficient drainage, bilateral lacrimal glands: Secondary | ICD-10-CM | POA: Diagnosis not present

## 2021-08-12 ENCOUNTER — Ambulatory Visit: Payer: Medicare Other | Admitting: Cardiology

## 2021-08-13 ENCOUNTER — Ambulatory Visit (INDEPENDENT_AMBULATORY_CARE_PROVIDER_SITE_OTHER): Payer: Medicare Other | Admitting: Family Medicine

## 2021-08-13 ENCOUNTER — Encounter: Payer: Self-pay | Admitting: Family Medicine

## 2021-08-13 VITALS — BP 110/70 | HR 75 | Temp 98.5°F | Resp 18 | Ht 65.0 in | Wt 182.2 lb

## 2021-08-13 DIAGNOSIS — M62838 Other muscle spasm: Secondary | ICD-10-CM

## 2021-08-13 DIAGNOSIS — Z8619 Personal history of other infectious and parasitic diseases: Secondary | ICD-10-CM | POA: Diagnosis not present

## 2021-08-13 DIAGNOSIS — M542 Cervicalgia: Secondary | ICD-10-CM | POA: Diagnosis not present

## 2021-08-13 DIAGNOSIS — E559 Vitamin D deficiency, unspecified: Secondary | ICD-10-CM

## 2021-08-13 DIAGNOSIS — E785 Hyperlipidemia, unspecified: Secondary | ICD-10-CM | POA: Diagnosis not present

## 2021-08-13 DIAGNOSIS — I83893 Varicose veins of bilateral lower extremities with other complications: Secondary | ICD-10-CM

## 2021-08-13 DIAGNOSIS — E538 Deficiency of other specified B group vitamins: Secondary | ICD-10-CM | POA: Diagnosis not present

## 2021-08-13 LAB — CBC WITH DIFFERENTIAL/PLATELET
Basophils Absolute: 0.1 10*3/uL (ref 0.0–0.1)
Basophils Relative: 0.9 % (ref 0.0–3.0)
Eosinophils Absolute: 0.1 10*3/uL (ref 0.0–0.7)
Eosinophils Relative: 1 % (ref 0.0–5.0)
HCT: 38.6 % (ref 36.0–46.0)
Hemoglobin: 12.9 g/dL (ref 12.0–15.0)
Lymphocytes Relative: 32.1 % (ref 12.0–46.0)
Lymphs Abs: 2.1 10*3/uL (ref 0.7–4.0)
MCHC: 33.3 g/dL (ref 30.0–36.0)
MCV: 96.9 fl (ref 78.0–100.0)
Monocytes Absolute: 0.5 10*3/uL (ref 0.1–1.0)
Monocytes Relative: 8.1 % (ref 3.0–12.0)
Neutro Abs: 3.9 10*3/uL (ref 1.4–7.7)
Neutrophils Relative %: 57.9 % (ref 43.0–77.0)
Platelets: 280 10*3/uL (ref 150.0–400.0)
RBC: 3.98 Mil/uL (ref 3.87–5.11)
RDW: 13.3 % (ref 11.5–15.5)
WBC: 6.7 10*3/uL (ref 4.0–10.5)

## 2021-08-13 LAB — LIPID PANEL
Cholesterol: 205 mg/dL — ABNORMAL HIGH (ref 0–200)
HDL: 64.3 mg/dL (ref >39.00)
LDL Cholesterol: 126 mg/dL — ABNORMAL HIGH (ref 0–99)
NonHDL: 140.2
Total CHOL/HDL Ratio: 3
Triglycerides: 69 mg/dL (ref 0.0–149.0)
VLDL: 13.8 mg/dL (ref 0.0–40.0)

## 2021-08-13 LAB — COMPREHENSIVE METABOLIC PANEL
ALT: 25 U/L (ref 0–35)
AST: 15 U/L (ref 0–37)
Albumin: 4.4 g/dL (ref 3.5–5.2)
Alkaline Phosphatase: 62 U/L (ref 39–117)
BUN: 20 mg/dL (ref 6–23)
CO2: 28 mEq/L (ref 19–32)
Calcium: 9.8 mg/dL (ref 8.4–10.5)
Chloride: 104 mEq/L (ref 96–112)
Creatinine, Ser: 0.81 mg/dL (ref 0.40–1.20)
GFR: 74.86 mL/min (ref >60.00)
Glucose, Bld: 95 mg/dL (ref 70–99)
Potassium: 4.2 mEq/L (ref 3.5–5.1)
Sodium: 140 mEq/L (ref 135–145)
Total Bilirubin: 0.4 mg/dL (ref 0.2–1.2)
Total Protein: 7.5 g/dL (ref 6.0–8.3)

## 2021-08-13 LAB — VITAMIN B12: Vitamin B-12: 706 pg/mL (ref 211–911)

## 2021-08-13 LAB — VITAMIN D 25 HYDROXY (VIT D DEFICIENCY, FRACTURES): VITD: 28.96 ng/mL — ABNORMAL LOW (ref 30.00–100.00)

## 2021-08-13 MED ORDER — CYCLOBENZAPRINE HCL 5 MG PO TABS: 5.0000 mg | ORAL_TABLET | Freq: Three times a day (TID) | ORAL | 1 refills | Status: DC | PRN

## 2021-08-13 MED ORDER — NONFORMULARY OR COMPOUNDED ITEM: 1 refills | Status: AC

## 2021-08-13 MED ORDER — GABAPENTIN 100 MG PO CAPS: 100.0000 mg | ORAL_CAPSULE | Freq: Every day | ORAL | 0 refills | Status: DC

## 2021-08-13 MED ORDER — ACYCLOVIR 5 % EX OINT: TOPICAL_OINTMENT | CUTANEOUS | 5 refills | Status: DC
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
## 2021-08-13 NOTE — Patient Instructions (Signed)
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
## 2021-08-28 ENCOUNTER — Encounter: Payer: Self-pay | Admitting: Cardiology

## 2021-08-28 ENCOUNTER — Ambulatory Visit (INDEPENDENT_AMBULATORY_CARE_PROVIDER_SITE_OTHER): Payer: Medicare Other | Admitting: Cardiology

## 2021-08-28 VITALS — BP 110/68 | HR 76 | Ht 65.0 in | Wt 182.0 lb

## 2021-08-28 DIAGNOSIS — E7800 Pure hypercholesterolemia, unspecified: Secondary | ICD-10-CM | POA: Diagnosis not present

## 2021-08-28 DIAGNOSIS — R600 Localized edema: Secondary | ICD-10-CM | POA: Diagnosis not present

## 2021-08-28 NOTE — Patient Instructions (Signed)
Medication Instructions:  Your physician recommends that you continue on your current medications as directed. Please refer to the Current Medication list given to you today.   *If you need a refill on your cardiac medications before your next appointment, please call your pharmacy*   Lab Work:  None ordered.  If you have labs (blood work) drawn today and your tests are completely normal, you will receive your results only by: [place] (if you have MyChart) OR A paper copy in the mail If you have any lab test that is abnormal or we need to change your treatment, we will call you to review the results.   Testing/Procedures:  Your physician has requested that you have an echocardiogram. Echocardiography is a painless test that uses sound waves to create images of your heart. It provides your doctor with information about the size and shape of your heart and how well your heart's chambers and valves are working. This procedure takes approximately one hour. There are no restrictions for this procedure.  Referred for Calcium Scoring.    Follow-Up: At [facility], you and your health needs are our priority.  As part of our continuing mission to provide you with exceptional heart care, we have created designated Provider Care Teams.  These Care Teams include your primary Cardiologist (physician) and Advanced Practice Providers (APPs -  Physician Assistants and Nurse Practitioners) who all work together to provide you with the care you need, when you need it.  We recommend signing up for the patient portal called "MyChart".  Sign up information is provided on this After Visit Summary.  MyChart is used to connect with patients for Virtual Visits (Telemedicine).  Patients are able to view lab/test results, encounter notes, upcoming appointments, etc.  Non-urgent messages can be sent to your provider as well.   To learn more about what you can do with MyChart, go to  [email or web address].    Your next appointment:   1 year(s)  The format for your next appointment:   In Person  Provider:   Kirk Ruths, MD    Other Instructions  Your physician wants you to follow-up in: 1 year with Dr. Stanford Breed.  You will receive a reminder letter in the mail two months in advance. If you don't receive a letter, please call our office to schedule the follow-up appointment.   Important Information About Sugar

## 2021-09-03 ENCOUNTER — Other Ambulatory Visit (HOSPITAL_BASED_OUTPATIENT_CLINIC_OR_DEPARTMENT_OTHER): Payer: Medicare Other

## 2021-09-04 ENCOUNTER — Ambulatory Visit (HOSPITAL_BASED_OUTPATIENT_CLINIC_OR_DEPARTMENT_OTHER)
Admission: RE | Admit: 2021-09-04 | Discharge: 2021-09-04 | Disposition: A | Discharge status: Home / Self Care | Payer: Medicare Other | Source: Ambulatory Visit | Attending: Cardiology | Admitting: Cardiology

## 2021-09-04 DIAGNOSIS — R600 Localized edema: Secondary | ICD-10-CM | POA: Insufficient documentation

## 2021-09-04 DIAGNOSIS — E7800 Pure hypercholesterolemia, unspecified: Secondary | ICD-10-CM | POA: Insufficient documentation

## 2021-09-10 ENCOUNTER — Ambulatory Visit (HOSPITAL_BASED_OUTPATIENT_CLINIC_OR_DEPARTMENT_OTHER)
Admission: RE | Admit: 2021-09-10 | Discharge: 2021-09-10 | Disposition: A | Discharge status: Home / Self Care | Payer: Medicare Other | Source: Ambulatory Visit | Attending: Cardiology | Admitting: Cardiology

## 2021-09-10 DIAGNOSIS — E7800 Pure hypercholesterolemia, unspecified: Secondary | ICD-10-CM | POA: Insufficient documentation

## 2021-09-10 DIAGNOSIS — R600 Localized edema: Secondary | ICD-10-CM | POA: Insufficient documentation

## 2021-09-10 LAB — ECHOCARDIOGRAM COMPLETE
AR max vel: 2.33 cm2
AV Area VTI: 2.24 cm2
AV Area mean vel: 1.97 cm2
AV Mean grad: 4 mmHg
AV Peak grad: 6.2 mmHg
Ao pk vel: 1.24 m/s
Area-P 1/2: 3.74 cm2
S' Lateral: 2.6 cm

## 2021-09-10 NOTE — Progress Notes (Signed)
  Echocardiogram 2D Echocardiogram has been performed.  Kimberly Escobar F 09/10/2021, 11:24 AM

## 2021-09-20 ENCOUNTER — Other Ambulatory Visit: Payer: Self-pay | Admitting: *Deleted

## 2021-09-20 DIAGNOSIS — I872 Venous insufficiency (chronic) (peripheral): Secondary | ICD-10-CM

## 2021-09-23 ENCOUNTER — Ambulatory Visit (HOSPITAL_BASED_OUTPATIENT_CLINIC_OR_DEPARTMENT_OTHER): Payer: Medicare Other

## 2021-09-24 ENCOUNTER — Other Ambulatory Visit (HOSPITAL_BASED_OUTPATIENT_CLINIC_OR_DEPARTMENT_OTHER): Payer: Medicare Other

## 2021-09-24 ENCOUNTER — Ambulatory Visit (HOSPITAL_BASED_OUTPATIENT_CLINIC_OR_DEPARTMENT_OTHER): Payer: Medicare Other

## 2021-09-25 ENCOUNTER — Encounter: Payer: Self-pay | Admitting: General Practice

## 2021-10-01 ENCOUNTER — Encounter (HOSPITAL_BASED_OUTPATIENT_CLINIC_OR_DEPARTMENT_OTHER): Payer: Self-pay

## 2021-10-01 ENCOUNTER — Other Ambulatory Visit (HOSPITAL_BASED_OUTPATIENT_CLINIC_OR_DEPARTMENT_OTHER): Payer: Self-pay

## 2021-10-01 ENCOUNTER — Ambulatory Visit (HOSPITAL_BASED_OUTPATIENT_CLINIC_OR_DEPARTMENT_OTHER)
Admission: RE | Admit: 2021-10-01 | Discharge: 2021-10-01 | Disposition: A | Discharge status: Home / Self Care | Payer: Medicare Other | Source: Ambulatory Visit | Attending: Family Medicine | Admitting: Family Medicine

## 2021-10-01 DIAGNOSIS — Z1231 Encounter for screening mammogram for malignant neoplasm of breast: Secondary | ICD-10-CM | POA: Diagnosis not present

## 2021-10-01 DIAGNOSIS — E2839 Other primary ovarian failure: Secondary | ICD-10-CM | POA: Insufficient documentation

## 2021-10-01 DIAGNOSIS — Z780 Asymptomatic menopausal state: Secondary | ICD-10-CM | POA: Diagnosis not present

## 2021-10-01 DIAGNOSIS — M8588 Other specified disorders of bone density and structure, other site: Secondary | ICD-10-CM | POA: Diagnosis not present

## 2021-10-01 MED ORDER — FLUAD QUADRIVALENT 0.5 ML IM PRSY
0.5000 mL | PREFILLED_SYRINGE | INTRAMUSCULAR | 0 refills | Status: DC
  Filled 2021-10-01: qty 0.5, 1d supply, fill #0

## 2021-10-02 ENCOUNTER — Encounter: Payer: Self-pay | Admitting: Vascular Surgery

## 2021-10-02 ENCOUNTER — Ambulatory Visit (HOSPITAL_COMMUNITY)
Admission: RE | Admit: 2021-10-02 | Discharge: 2021-10-02 | Disposition: A | Discharge status: Home / Self Care | Payer: Medicare Other | Source: Ambulatory Visit | Attending: Vascular Surgery | Admitting: Vascular Surgery

## 2021-10-02 ENCOUNTER — Ambulatory Visit: Payer: Medicare Other | Admitting: Vascular Surgery

## 2021-10-02 ENCOUNTER — Ambulatory Visit: Payer: Medicare Other | Admitting: Physician Assistant

## 2021-10-02 VITALS — BP 116/83 | HR 83 | Temp 98.1°F | Resp 16 | Ht 65.0 in | Wt 179.0 lb

## 2021-10-02 DIAGNOSIS — I872 Venous insufficiency (chronic) (peripheral): Secondary | ICD-10-CM
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
## 2021-10-03 ENCOUNTER — Ambulatory Visit: Payer: Medicare Other | Admitting: Dermatology

## 2021-10-08 ENCOUNTER — Other Ambulatory Visit: Payer: Self-pay | Admitting: Family Medicine

## 2021-10-08 ENCOUNTER — Telehealth: Payer: Self-pay | Admitting: Family Medicine

## 2021-10-08 MED ORDER — BENZONATATE 200 MG PO CAPS: 200.0000 mg | ORAL_CAPSULE | Freq: Two times a day (BID) | ORAL | 0 refills | Status: DC | PRN

## 2021-10-08 NOTE — Telephone Encounter (Signed)
Okay to refill or OV?

## 2021-10-08 NOTE — Telephone Encounter (Signed)
Pt states she wants a refill on discontinued rx. Please advise.   Medication: benzonatate (TESSALON) 100 MG capsule  Has the patient contacted their pharmacy? No.  Preferred Pharmacy:  [id number] - [place], [place] - [address] AT [facility] [address]   [address], [place] [address]  Phone:  [phone]  Fax:  [phone]

## 2021-10-11 ENCOUNTER — Ambulatory Visit: Payer: Medicare Other | Admitting: Rheumatology

## 2021-10-22 ENCOUNTER — Other Ambulatory Visit (HOSPITAL_BASED_OUTPATIENT_CLINIC_OR_DEPARTMENT_OTHER): Payer: Self-pay

## 2021-10-23 ENCOUNTER — Other Ambulatory Visit (HOSPITAL_BASED_OUTPATIENT_CLINIC_OR_DEPARTMENT_OTHER): Payer: Self-pay

## 2021-10-23 MED ORDER — INFLUENZA VAC A&B SA ADJ QUAD 0.5 ML IM PRSY
PREFILLED_SYRINGE | INTRAMUSCULAR | 0 refills | Status: DC
  Filled 2021-10-23: qty 0.5, 1d supply, fill #0

## 2021-11-06 ENCOUNTER — Other Ambulatory Visit: Payer: Self-pay | Admitting: Family Medicine

## 2021-11-06 DIAGNOSIS — M542 Cervicalgia: Secondary | ICD-10-CM

## 2021-11-07 ENCOUNTER — Ambulatory Visit (INDEPENDENT_AMBULATORY_CARE_PROVIDER_SITE_OTHER): Payer: Medicare Other | Admitting: Family Medicine

## 2021-11-07 ENCOUNTER — Encounter: Payer: Self-pay | Admitting: Family Medicine

## 2021-11-07 VITALS — BP 120/60 | HR 87 | Temp 99.0°F | Resp 18 | Ht 65.0 in | Wt 183.0 lb

## 2021-11-07 DIAGNOSIS — M79661 Pain in right lower leg: Secondary | ICD-10-CM

## 2021-11-07 NOTE — Assessment & Plan Note (Signed)
And small mass--- very tender to touch Check [place] Warm compresses / ice Call or rto prn
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
## 2021-11-18 ENCOUNTER — Ambulatory Visit (HOSPITAL_BASED_OUTPATIENT_CLINIC_OR_DEPARTMENT_OTHER): Payer: Medicare Other

## 2021-11-22 ENCOUNTER — Ambulatory Visit: Payer: Medicare Other | Attending: Rheumatology | Admitting: Rheumatology

## 2021-11-22 ENCOUNTER — Encounter: Payer: Self-pay | Admitting: Rheumatology

## 2021-11-22 VITALS — BP 126/78 | HR 77 | Resp 16 | Ht 65.0 in | Wt 180.0 lb

## 2021-11-22 DIAGNOSIS — M797 Fibromyalgia: Secondary | ICD-10-CM | POA: Diagnosis not present

## 2021-11-22 DIAGNOSIS — M8589 Other specified disorders of bone density and structure, multiple sites: Secondary | ICD-10-CM | POA: Diagnosis not present

## 2021-11-22 DIAGNOSIS — R21 Rash and other nonspecific skin eruption: Secondary | ICD-10-CM

## 2021-11-22 DIAGNOSIS — E785 Hyperlipidemia, unspecified: Secondary | ICD-10-CM

## 2021-11-22 DIAGNOSIS — R768 Other specified abnormal immunological findings in serum: Secondary | ICD-10-CM | POA: Diagnosis not present

## 2021-11-22 DIAGNOSIS — Z9889 Other specified postprocedural states: Secondary | ICD-10-CM | POA: Diagnosis not present

## 2021-11-22 DIAGNOSIS — M19041 Primary osteoarthritis, right hand: Secondary | ICD-10-CM

## 2021-11-22 DIAGNOSIS — M19042 Primary osteoarthritis, left hand: Secondary | ICD-10-CM

## 2021-11-22 DIAGNOSIS — M7062 Trochanteric bursitis, left hip: Secondary | ICD-10-CM | POA: Diagnosis not present

## 2021-11-22 DIAGNOSIS — M533 Sacrococcygeal disorders, not elsewhere classified: Secondary | ICD-10-CM | POA: Diagnosis not present

## 2021-11-22 DIAGNOSIS — G8929 Other chronic pain: Secondary | ICD-10-CM

## 2021-11-22 DIAGNOSIS — M5030 Other cervical disc degeneration, unspecified cervical region: Secondary | ICD-10-CM | POA: Diagnosis not present

## 2021-11-22 DIAGNOSIS — M170 Bilateral primary osteoarthritis of knee: Secondary | ICD-10-CM | POA: Diagnosis not present

## 2021-11-22 DIAGNOSIS — Z8719 Personal history of other diseases of the digestive system: Secondary | ICD-10-CM

## 2021-11-22 DIAGNOSIS — Z8669 Personal history of other diseases of the nervous system and sense organs: Secondary | ICD-10-CM

## 2021-11-22 DIAGNOSIS — G4709 Other insomnia: Secondary | ICD-10-CM | POA: Diagnosis not present

## 2021-11-22 NOTE — Patient Instructions (Addendum)
Hand Exercises Hand exercises can be helpful for almost anyone. These exercises can strengthen the hands, improve flexibility and movement, and increase blood flow to the hands. These results can make work and daily tasks easier. Hand exercises can be especially helpful for people who have joint pain from arthritis or have nerve damage from overuse (carpal tunnel syndrome). These exercises can also help people who have injured a hand. Exercises Most of these hand exercises are gentle stretching and motion exercises. It is usually safe to do them often throughout the day. Warming up your hands before exercise may help to reduce stiffness. You can do this with gentle massage or by placing your hands in warm water for 10-15 minutes. It is normal to feel some stretching, pulling, tightness, or mild discomfort as you begin new exercises. This will gradually improve. Stop an exercise right away if you feel sudden, severe pain or your pain gets worse. Ask your health care provider which exercises are best for you. Knuckle bend or "claw" fist  Stand or sit with your arm, hand, and all five fingers pointed straight up. Make sure to keep your wrist straight during the exercise. Gently bend your fingers down toward your palm until the tips of your fingers are touching the top of your palm. Keep your big knuckle straight and just bend the small knuckles in your fingers. Hold this position for __________ seconds. Straighten (extend) your fingers back to the starting position. Repeat this exercise 5-10 times with each hand. Full finger fist  Stand or sit with your arm, hand, and all five fingers pointed straight up. Make sure to keep your wrist straight during the exercise. Gently bend your fingers into your palm until the tips of your fingers are touching the middle of your palm. Hold this position for __________ seconds. Extend your fingers back to the starting position, stretching every joint fully. Repeat  this exercise 5-10 times with each hand. Straight fist Stand or sit with your arm, hand, and all five fingers pointed straight up. Make sure to keep your wrist straight during the exercise. Gently bend your fingers at the big knuckle, where your fingers meet your hand, and the middle knuckle. Keep the knuckle at the tips of your fingers straight and try to touch the bottom of your palm. Hold this position for __________ seconds. Extend your fingers back to the starting position, stretching every joint fully. Repeat this exercise 5-10 times with each hand. Tabletop  Stand or sit with your arm, hand, and all five fingers pointed straight up. Make sure to keep your wrist straight during the exercise. Gently bend your fingers at the big knuckle, where your fingers meet your hand, as far down as you can while keeping the small knuckles in your fingers straight. Think of forming a tabletop with your fingers. Hold this position for __________ seconds. Extend your fingers back to the starting position, stretching every joint fully. Repeat this exercise 5-10 times with each hand. Finger spread  Place your hand flat on a table with your palm facing down. Make sure your wrist stays straight as you do this exercise. Spread your fingers and thumb apart from each other as far as you can until you feel a gentle stretch. Hold this position for __________ seconds. Bring your fingers and thumb tight together again. Hold this position for __________ seconds. Repeat this exercise 5-10 times with each hand. Making circles  Stand or sit with your arm, hand, and all five fingers pointed   straight up. Make sure to keep your wrist straight during the exercise. Make a circle by touching the tip of your thumb to the tip of your index finger. Hold for __________ seconds. Then open your hand wide. Repeat this motion with your thumb and each finger on your hand. Repeat this exercise 5-10 times with each hand. Thumb  motion  Sit with your forearm resting on a table and your wrist straight. Your thumb should be facing up toward the ceiling. Keep your fingers relaxed as you move your thumb. Lift your thumb up as high as you can toward the ceiling. Hold for __________ seconds. Bend your thumb across your palm as far as you can, reaching the tip of your thumb for the small finger (pinkie) side of your palm. Hold for __________ seconds. Repeat this exercise 5-10 times with each hand. Grip strengthening  Hold a stress ball or other soft ball in the middle of your hand. Slowly increase the pressure, squeezing the ball as much as you can without causing pain. Think of bringing the tips of your fingers into the middle of your palm. All of your finger joints should bend when doing this exercise. Hold your squeeze for __________ seconds, then relax. Repeat this exercise 5-10 times with each hand. Contact a health care provider if: Your hand pain or discomfort gets much worse when you do an exercise. Your hand pain or discomfort does not improve within 2 hours after you exercise. If you have any of these problems, stop doing these exercises right away. Do not do them again unless your health care provider says that you can. Get help right away if: You develop sudden, severe hand pain or swelling. If this happens, stop doing these exercises right away. Do not do them again unless your health care provider says that you can. This information is not intended to replace advice given to you by your health care provider. Make sure you discuss any questions you have with your health care provider. Document Revised: 04/19/2020 Document Reviewed: 04/19/2020 Elsevier Patient Education  2023 [employer] Exercises for Chronic Knee Pain Chronic knee pain is pain that lasts longer than 3 months. For most people with chronic knee pain, exercise and weight loss is an important part of treatment. Your health care provider may want  you to focus on: Strengthening the muscles that support your knee. This can take pressure off your knee and lessen pain. Preventing knee stiffness. Maintaining or increasing how far you can move your knee. Losing weight (if this applies) to take pressure off your knee, decrease your risk for injury, and make it easier for you to exercise. Your health care provider will help you develop an exercise program that matches your needs and physical abilities. Below are simple, low-impact exercises you can do at home. Ask your health care provider or a physical therapist how often you should do your exercise program and how many times to repeat each exercise. General safety tips Follow these safety tips for exercising with chronic knee pain: Get your health care provider's approval before doing any exercises. Start slowly and stop any time an exercise causes pain. Do not exercise if your knee pain is flaring up. Warm up first. Stretching a cold muscle can cause an injury. Do 5-10 minutes of easy movement or light stretching before beginning your exercise routine. Do 5-10 minutes of low-impact activity (like walking or cycling) before starting strengthening exercises. Contact your health care provider any time you have pain   during or after exercising. Exercise may cause discomfort but should not be painful. It is normal to be a little stiff or sore after exercising.  Stretching and range-of-motion exercises Front thigh stretch  Stand up straight and support your body by holding on to a chair or resting one hand on a wall. With your legs straight and close together, bend one knee to lift your heel up toward your buttocks. Using one hand for support, grab your ankle with your free hand. Pull your foot up closer toward your buttocks to feel the stretch in front of your thigh. Hold the stretch for 30 seconds. Repeat __________ times. Complete this exercise __________ times a day. Back thigh stretch  Sit  on the floor with your back straight and your legs out straight in front of you. Place the palms of your hands on the floor and slide them toward your feet as you bend at the hip. Try to touch your nose to your knees and feel the stretch in the back of your thighs. Hold for 30 seconds. Repeat __________ times. Complete this exercise __________ times a day. Calf stretch  Stand facing a wall. Place the palms of your hands flat against the wall, arms extended, and lean slightly against the wall. Get into a lunge position with one leg bent at the knee and the other leg stretched out straight behind you. Keep both feet facing the wall and increase the bend in your knee while keeping the heel of the other leg flat on the ground. You should feel the stretch in your calf. Hold for 30 seconds. Repeat __________ times. Complete this exercise __________ times a day. Strengthening exercises Straight leg lift Lie on your back with one knee bent and the other leg out straight. Slowly lift the straight leg without bending the knee. Lift until your foot is about 12 inches (30 cm) off the floor. Hold for 3-5 seconds and slowly lower your leg. Repeat __________ times. Complete this exercise __________ times a day. Single leg dip Stand between two chairs and put both hands on the backs of the chairs for support. Extend one leg out straight with your body weight resting on the heel of the standing leg. Slowly bend your standing knee to dip your body to the level that is comfortable for you. Hold for 3-5 seconds. Repeat __________ times. Complete this exercise __________ times a day. Hamstring curls Stand straight, knees close together, facing the back of a chair. Hold on to the back of a chair with both hands. Keep one leg straight. Bend the other knee while bringing the heel up toward the buttock until the knee is bent at a 90-degree angle (right angle). Hold for 3-5 seconds. Repeat __________ times.  Complete this exercise __________ times a day. Wall squat Stand straight with your back, hips, and head against a wall. Step forward one foot at a time with your back still against the wall. Your feet should be 2 feet (61 cm) from the wall at shoulder width. Keeping your back, hips, and head against the wall, slide down the wall to as close of a sitting position as you can get. Hold for 5-10 seconds, then slowly slide back up. Repeat __________ times. Complete this exercise __________ times a day. Step-ups Step up with one foot onto a sturdy platform or stool that is about 6 inches (15 cm) high. Face sideways with one foot on the platform and one on the ground. Place all your weight on  on the platform foot and lift your body off the ground until your knee extends. Let your other leg hang free to the side. Hold for 3-5 seconds then slowly lower your weight down to the floor foot. Repeat __________ times. Complete this exercise __________ times a day. Contact a health care provider if: Your exercise causes pain. Your pain is worse after you exercise. Your pain prevents you from doing your exercises. This information is not intended to replace advice given to you by your health care provider. Make sure you discuss any questions you have with your health care provider. Document Revised: 05/05/2019 Document Reviewed: 12/27/2018 Elsevier Patient Education  2023 [employer]  

## 2021-11-24 NOTE — Progress Notes (Signed)
Positive Smith, positive RNP are consistent with autoimmune disease.  Please schedule an earlier appointment to discuss treatment options.

## 2021-11-26 LAB — ANTI-SMITH ANTIBODY: ENA SM Ab Ser-aCnc: 3.8 AI — AB

## 2021-11-26 LAB — C3 AND C4
C3 Complement: 152 mg/dL (ref 83–193)
C4 Complement: 28 mg/dL (ref 15–57)

## 2021-11-26 LAB — ANA: Anti Nuclear Antibody (ANA): NEGATIVE

## 2021-11-26 LAB — SEDIMENTATION RATE: Sed Rate: 17 mm/h (ref 0–30)

## 2021-11-26 LAB — RNP ANTIBODY: Ribonucleic Protein(ENA) Antibody, IgG: 1.2 AI — AB

## 2021-11-26 LAB — ANTI-DNA ANTIBODY, DOUBLE-STRANDED: ds DNA Ab: <1 IU/mL

## 2021-11-27 ENCOUNTER — Encounter: Payer: Self-pay | Admitting: General Practice

## 2021-12-02 DIAGNOSIS — L821 Other seborrheic keratosis: Secondary | ICD-10-CM | POA: Diagnosis not present

## 2021-12-02 DIAGNOSIS — L309 Dermatitis, unspecified: Secondary | ICD-10-CM | POA: Diagnosis not present

## 2021-12-10 ENCOUNTER — Encounter: Payer: Self-pay | Admitting: Family Medicine

## 2021-12-10 ENCOUNTER — Ambulatory Visit (INDEPENDENT_AMBULATORY_CARE_PROVIDER_SITE_OTHER): Payer: Medicare Other | Admitting: Family Medicine

## 2021-12-10 VITALS — BP 120/78 | HR 80 | Temp 98.1°F | Wt 180.6 lb

## 2021-12-10 DIAGNOSIS — R413 Other amnesia: Secondary | ICD-10-CM

## 2021-12-10 DIAGNOSIS — E559 Vitamin D deficiency, unspecified: Secondary | ICD-10-CM | POA: Diagnosis not present

## 2021-12-10 DIAGNOSIS — Z0000 Encounter for general adult medical examination without abnormal findings: Secondary | ICD-10-CM | POA: Diagnosis not present

## 2021-12-10 DIAGNOSIS — E538 Deficiency of other specified B group vitamins: Secondary | ICD-10-CM

## 2021-12-10 DIAGNOSIS — E785 Hyperlipidemia, unspecified: Secondary | ICD-10-CM
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
## 2021-12-12 ENCOUNTER — Other Ambulatory Visit (INDEPENDENT_AMBULATORY_CARE_PROVIDER_SITE_OTHER): Payer: Medicare Other

## 2021-12-12 ENCOUNTER — Other Ambulatory Visit (HOSPITAL_BASED_OUTPATIENT_CLINIC_OR_DEPARTMENT_OTHER): Payer: Self-pay

## 2021-12-12 DIAGNOSIS — E538 Deficiency of other specified B group vitamins: Secondary | ICD-10-CM | POA: Diagnosis not present

## 2021-12-12 DIAGNOSIS — R413 Other amnesia: Secondary | ICD-10-CM

## 2021-12-12 DIAGNOSIS — E559 Vitamin D deficiency, unspecified: Secondary | ICD-10-CM

## 2021-12-12 DIAGNOSIS — E785 Hyperlipidemia, unspecified: Secondary | ICD-10-CM

## 2021-12-12 LAB — CBC WITH DIFFERENTIAL/PLATELET
Basophils Absolute: 0 10*3/uL (ref 0.0–0.1)
Basophils Relative: 0.5 % (ref 0.0–3.0)
Eosinophils Absolute: 0.1 10*3/uL (ref 0.0–0.7)
Eosinophils Relative: 1.4 % (ref 0.0–5.0)
HCT: 40.1 % (ref 36.0–46.0)
Hemoglobin: 13.3 g/dL (ref 12.0–15.0)
Lymphocytes Relative: 37.8 % (ref 12.0–46.0)
Lymphs Abs: 2.5 10*3/uL (ref 0.7–4.0)
MCHC: 33.3 g/dL (ref 30.0–36.0)
MCV: 95.6 fl (ref 78.0–100.0)
Monocytes Absolute: 0.6 10*3/uL (ref 0.1–1.0)
Monocytes Relative: 9.4 % (ref 3.0–12.0)
Neutro Abs: 3.3 10*3/uL (ref 1.4–7.7)
Neutrophils Relative %: 50.9 % (ref 43.0–77.0)
Platelets: 295 10*3/uL (ref 150.0–400.0)
RBC: 4.19 Mil/uL (ref 3.87–5.11)
RDW: 13.8 % (ref 11.5–15.5)
WBC: 6.5 10*3/uL (ref 4.0–10.5)

## 2021-12-12 LAB — COMPREHENSIVE METABOLIC PANEL
ALT: 23 U/L (ref 0–35)
AST: 15 U/L (ref 0–37)
Albumin: 4.5 g/dL (ref 3.5–5.2)
Alkaline Phosphatase: 69 U/L (ref 39–117)
BUN: 16 mg/dL (ref 6–23)
CO2: 29 mEq/L (ref 19–32)
Calcium: 9.8 mg/dL (ref 8.4–10.5)
Chloride: 104 mEq/L (ref 96–112)
Creatinine, Ser: 0.75 mg/dL (ref 0.40–1.20)
GFR: 81.91 mL/min (ref >60.00)
Glucose, Bld: 83 mg/dL (ref 70–99)
Potassium: 4.3 mEq/L (ref 3.5–5.1)
Sodium: 140 mEq/L (ref 135–145)
Total Bilirubin: 0.5 mg/dL (ref 0.2–1.2)
Total Protein: 7.5 g/dL (ref 6.0–8.3)

## 2021-12-12 LAB — LIPID PANEL
Cholesterol: 196 mg/dL (ref 0–200)
HDL: 58.1 mg/dL (ref >39.00)
LDL Cholesterol: 119 mg/dL — ABNORMAL HIGH (ref 0–99)
NonHDL: 138.21
Total CHOL/HDL Ratio: 3
Triglycerides: 94 mg/dL (ref 0.0–149.0)
VLDL: 18.8 mg/dL (ref 0.0–40.0)

## 2021-12-12 LAB — VITAMIN D 25 HYDROXY (VIT D DEFICIENCY, FRACTURES): VITD: 44.22 ng/mL (ref 30.00–100.00)

## 2021-12-12 LAB — VITAMIN B12: Vitamin B-12: 611 pg/mL (ref 211–911)

## 2021-12-12 MED ORDER — COMIRNATY 30 MCG/0.3ML IM SUSY
PREFILLED_SYRINGE | INTRAMUSCULAR | 0 refills | Status: DC
  Filled 2021-12-12: qty 0.3, 1d supply, fill #0

## 2021-12-14 DIAGNOSIS — E559 Vitamin D deficiency, unspecified: Secondary | ICD-10-CM | POA: Insufficient documentation

## 2021-12-14 DIAGNOSIS — E538 Deficiency of other specified B group vitamins: Secondary | ICD-10-CM | POA: Insufficient documentation

## 2021-12-14 NOTE — Assessment & Plan Note (Signed)
Refer to neuro psych

## 2021-12-14 NOTE — Assessment & Plan Note (Signed)
Encourage heart healthy diet such as MIND or DASH diet, increase exercise, avoid trans fats, simple carbohydrates and processed foods, consider a krill or fish or flaxseed oil cap daily.  °

## 2021-12-14 NOTE — Assessment & Plan Note (Signed)
Ghm utd Check labs  See avs 
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
## 2021-12-27 ENCOUNTER — Ambulatory Visit: Payer: Medicare Other | Admitting: Obstetrics and Gynecology

## 2021-12-30 ENCOUNTER — Telehealth: Payer: Self-pay | Admitting: Family Medicine

## 2021-12-30 NOTE — Telephone Encounter (Signed)
Prescription Request  12/30/2021  Is this a "Controlled Substance" medicine? Yes  LOV: 12/10/2021  What is the name of the medication or equipment?   benzonatate (TESSALON) 200 MG capsule [[id number]]  DISCONTINUED   Have you contacted your pharmacy to request a refill? No   Which pharmacy would you like this sent to?  [id number] - [place], [place] - [address] [place] [address] Phone: [phone] Fax: [phone]    Patient notified that their request is being sent to the clinical staff for review and that they should receive a response within 2 business days.   Please advise at Mobile [phone] (mobile)

## 2021-12-30 NOTE — Telephone Encounter (Signed)
Okay to refill? Pt last seen on 11/28. Med was discontinued

## 2021-12-31 ENCOUNTER — Encounter: Payer: Self-pay | Admitting: Family

## 2021-12-31 ENCOUNTER — Ambulatory Visit (INDEPENDENT_AMBULATORY_CARE_PROVIDER_SITE_OTHER): Payer: Medicare Other | Admitting: Family

## 2021-12-31 VITALS — BP 134/82 | HR 76 | Temp 98.8°F | Resp 18 | Ht 65.0 in | Wt 180.0 lb

## 2021-12-31 DIAGNOSIS — J069 Acute upper respiratory infection, unspecified: Secondary | ICD-10-CM | POA: Diagnosis not present

## 2021-12-31 MED ORDER — BENZONATATE 100 MG PO CAPS: 100.0000 mg | ORAL_CAPSULE | Freq: Three times a day (TID) | ORAL | 0 refills | Status: DC | PRN

## 2021-12-31 NOTE — Telephone Encounter (Signed)
Patient was seen today.
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
## 2022-01-08 ENCOUNTER — Ambulatory Visit: Payer: Medicare Other | Attending: Physician Assistant | Admitting: Physician Assistant

## 2022-01-08 ENCOUNTER — Encounter: Payer: Self-pay | Admitting: Physician Assistant

## 2022-01-08 VITALS — BP 144/82 | HR 84 | Resp 16 | Ht 65.0 in | Wt 179.0 lb

## 2022-01-08 DIAGNOSIS — M797 Fibromyalgia: Secondary | ICD-10-CM

## 2022-01-08 DIAGNOSIS — M5030 Other cervical disc degeneration, unspecified cervical region: Secondary | ICD-10-CM

## 2022-01-08 DIAGNOSIS — M533 Sacrococcygeal disorders, not elsewhere classified: Secondary | ICD-10-CM

## 2022-01-08 DIAGNOSIS — R21 Rash and other nonspecific skin eruption: Secondary | ICD-10-CM | POA: Diagnosis not present

## 2022-01-08 DIAGNOSIS — R768 Other specified abnormal immunological findings in serum: Secondary | ICD-10-CM

## 2022-01-08 DIAGNOSIS — M8589 Other specified disorders of bone density and structure, multiple sites: Secondary | ICD-10-CM | POA: Diagnosis not present

## 2022-01-08 DIAGNOSIS — M170 Bilateral primary osteoarthritis of knee: Secondary | ICD-10-CM | POA: Diagnosis not present

## 2022-01-08 DIAGNOSIS — Z8669 Personal history of other diseases of the nervous system and sense organs: Secondary | ICD-10-CM

## 2022-01-08 DIAGNOSIS — M19041 Primary osteoarthritis, right hand: Secondary | ICD-10-CM | POA: Diagnosis not present

## 2022-01-08 DIAGNOSIS — Z9889 Other specified postprocedural states: Secondary | ICD-10-CM

## 2022-01-08 DIAGNOSIS — M359 Systemic involvement of connective tissue, unspecified: Secondary | ICD-10-CM

## 2022-01-08 DIAGNOSIS — Z79899 Other long term (current) drug therapy: Secondary | ICD-10-CM | POA: Diagnosis not present

## 2022-01-08 DIAGNOSIS — G4709 Other insomnia: Secondary | ICD-10-CM

## 2022-01-08 DIAGNOSIS — M7062 Trochanteric bursitis, left hip: Secondary | ICD-10-CM | POA: Diagnosis not present

## 2022-01-08 DIAGNOSIS — Z8719 Personal history of other diseases of the digestive system: Secondary | ICD-10-CM

## 2022-01-08 DIAGNOSIS — M19042 Primary osteoarthritis, left hand: Secondary | ICD-10-CM

## 2022-01-08 DIAGNOSIS — E785 Hyperlipidemia, unspecified: Secondary | ICD-10-CM

## 2022-01-08 DIAGNOSIS — G8929 Other chronic pain: Secondary | ICD-10-CM

## 2022-01-08 NOTE — Patient Instructions (Signed)
Standing Labs We placed an order today for your standing lab work.   Please have your standing labs drawn in 1 month, 3 months, then every 5 months   Please have your labs drawn 2 weeks prior to your appointment so that the provider can discuss your lab results at your appointment.  Please note that you may see your imaging and lab results in [facility] before we have reviewed them. We will contact you once all results are reviewed. Please allow our office up to 72 hours to thoroughly review all of the results before contacting the office for clarification of your results.  Lab hours are:   Monday through Thursday from 8:00 am -12:30 pm and 1:00 pm-5:00 pm and Friday from 8:00 am-12:00 pm.  Please be advised, all patients with office appointments requiring lab work will take precedent over walk-in lab work.   Labs are drawn by [facility]. Please bring your co-pay at the time of your lab draw.  You may receive a bill from [facility] for your lab work.  Please note if you are on Hydroxychloroquine and and an order has been placed for a Hydroxychloroquine level, you will need to have it drawn 4 hours or more after your last dose.  If you wish to have your labs drawn at another location, please call the office 24 hours in advance so we can fax the orders.  The office is located at [address], [place], [place], [place] [address] No appointment is necessary.    If you have any questions regarding directions or hours of operation,  please call [phone].   As a reminder, please drink plenty of water prior to coming for your lab work. Thanks!   Hydroxychloroquine Tablets What is this medication? HYDROXYCHLOROQUINE (hye drox ee KLOR oh kwin) treats autoimmune conditions, such as rheumatoid arthritis and lupus. It works by slowing down an overactive immune system. It may also be used to prevent and treat malaria. It works by killing the parasite that causes malaria. It belongs to a group of  medications called DMARDs. This medicine may be used for other purposes; ask your health care provider or pharmacist if you have questions. COMMON BRAND NAME(S): Plaquenil, Quineprox What should I tell my care team before I take this medication? They need to know if you have any of these conditions: Diabetes Eye disease, vision problems Frequently drink alcohol G6PD deficiency Heart disease Irregular heartbeat or rhythm Kidney disease Liver disease Porphyria Psoriasis An unusual or allergic reaction to hydroxychloroquine, other medications, foods, dyes, or preservatives Pregnant or trying to get pregnant Breastfeeding How should I use this medication? Take this medication by mouth with water. Take it as directed on the prescription label. Do not cut, crush, or chew this medication. Swallow the tablets whole. Take it with food. Do not take it more than directed. Take all of this medication unless your care team tells you to stop it early. Keep taking it even if you think you are better. Take products with antacids in them at a different time of day than this medication. Take this medication 4 hours before or 4 hours after antacids. Talk to your care team if you have questions. Talk to your care team about the use of this medication in children. While this medication may be prescribed for selected conditions, precautions do apply. Overdosage: If you think you have taken too much of this medicine contact a poison control center or emergency room at once. NOTE: This medicine is only  for you. Do not share this medicine with others. What if I miss a dose? If you miss a dose, take it as soon as you can. If it is almost time for your next dose, take only that dose. Do not take double or extra doses. What may interact with this medication? Do not take this medication with any of the following: Cisapride Dronedarone Pimozide Thioridazine This medication may also interact with the  following: Ampicillin Antacids Cimetidine Cyclosporine Digoxin Kaolin Medications for diabetes, such as insulin, glipizide, glyburide Medications for seizures, such as carbamazepine, phenobarbital, phenytoin Mefloquine Methotrexate Other medications that cause heart rhythm changes Praziquantel This list may not describe all possible interactions. Give your health care provider a list of all the medicines, herbs, non-prescription drugs, or dietary supplements you use. Also tell them if you smoke, drink alcohol, or use illegal drugs. Some items may interact with your medicine. What should I watch for while using this medication? Visit your care team for regular checks on your progress. Tell your care team if your symptoms do not start to get better or if they get worse. You may need blood work done while you are taking this medication. If you take other medications that can affect heart rhythm, you may need more testing. Talk to your care team if you have questions. Your vision may be tested before and during use of this medication. Tell your care team right away if you have any change in your eyesight. This medication may cause serious skin reactions. They can happen weeks to months after starting the medication. Contact your care team right away if you notice fevers or flu-like symptoms with a rash. The rash may be red or purple and then turn into blisters or peeling of the skin. Or, you might notice a red rash with swelling of the face, lips or lymph nodes in your neck or under your arms. If you or your family notice any changes in your behavior, such as new or worsening depression, thoughts of harming yourself, anxiety, or other unusual or disturbing thoughts, or memory loss, call your care team right away. What side effects may I notice from receiving this medication? Side effects that you should report to your care team as soon as possible: Allergic reactions--skin rash, itching, hives,  swelling of the face, lips, tongue, or throat Aplastic anemia--unusual weakness or fatigue, dizziness, headache, trouble breathing, increased bleeding or bruising Change in vision Heart rhythm changes--fast or irregular heartbeat, dizziness, feeling faint or lightheaded, chest pain, trouble breathing Infection--fever, chills, cough, or sore throat Low blood sugar (hypoglycemia)--tremors or shaking, anxiety, sweating, cold or clammy skin, confusion, dizziness, rapid heartbeat Muscle injury--unusual weakness or fatigue, muscle pain, dark yellow or Jeschke urine, decrease in amount of urine Pain, tingling, or numbness in the hands or feet Rash, fever, and swollen lymph nodes Redness, blistering, peeling, or loosening of the skin, including inside the mouth Thoughts of suicide or self-harm, worsening mood, or feelings of depression Unusual bruising or bleeding Side effects that usually do not require medical attention (report to your care team if they continue or are bothersome): Diarrhea Headache Nausea Stomach pain Vomiting This list may not describe all possible side effects. Call your doctor for medical advice about side effects. You may report side effects to FDA at 1-800-FDA-1088. Where should I keep my medication? Keep out of the reach of children and pets. Store at room temperature up to 30 degrees C (86 degrees F). Protect from light. Get rid of any  unused medication after the expiration date. To get rid of medications that are no longer needed or have expired: Take the medication to a medication take-back program. Check with your pharmacy or law enforcement to find a location. If you cannot return the medication, check the label or package insert to see if the medication should be thrown out in the garbage or flushed down the toilet. If you are not sure, ask your care team. If it is safe to put it in the trash, empty the medication out of the container. Mix the medication with cat litter,  dirt, coffee grounds, or other unwanted substance. Seal the mixture in a bag or container. Put it in the trash. NOTE: This sheet is a summary. It may not cover all possible information. If you have questions about this medicine, talk to your doctor, pharmacist, or health care provider.  2023 Elsevier/Gold Standard (2004-03-12 00:00:00)

## 2022-01-08 NOTE — Progress Notes (Signed)
Pharmacy Note  Subjective: Patient presents today to [facility] for follow up office visit.   Patient seen by the pharmacist for counseling on hydroxychloroquine for autoimmune disease.  .  Objective: CMP     Component Value Date/Time   NA 140 12/12/2021 0904   K 4.3 12/12/2021 0904   CL 104 12/12/2021 0904   CO2 29 12/12/2021 0904   GLUCOSE 83 12/12/2021 0904   BUN 16 12/12/2021 0904   CREATININE 0.75 12/12/2021 0904   CREATININE 0.94 11/04/2019 1458   CALCIUM 9.8 12/12/2021 0904   PROT 7.5 12/12/2021 0904   ALBUMIN 4.5 12/12/2021 0904   AST 15 12/12/2021 0904   ALT 23 12/12/2021 0904   ALKPHOS 69 12/12/2021 0904   BILITOT 0.5 12/12/2021 0904    CBC    Component Value Date/Time   WBC 6.5 12/12/2021 0904   RBC 4.19 12/12/2021 0904   HGB 13.3 12/12/2021 0904   HCT 40.1 12/12/2021 0904   PLT 295.0 12/12/2021 0904   MCV 95.6 12/12/2021 0904   MCH 32.0 11/04/2019 1458   MCHC 33.3 12/12/2021 0904   RDW 13.8 12/12/2021 0904   LYMPHSABS 2.5 12/12/2021 0904   MONOABS 0.6 12/12/2021 0904   EOSABS 0.1 12/12/2021 0904   BASOSABS 0.0 12/12/2021 0904    Assessment/Plan: Patient was counseled on the purpose, proper use, and adverse effects of hydroxychloroquine including nausea/diarrhea, skin rash, headaches, and sun sensitivity.  Advised patient to wear sunscreen once starting hydroxychloroquine to reduce risk of rash associated with sun sensitivity.  Discussed importance of annual eye exams while on hydroxychloroquine to monitor to ocular toxicity and discussed importance of frequent laboratory monitoring.  Provided patient with eye exam form for baseline ophthalmologic exam.  Reviewed risk for QTC prolongation when used in combination with other QTc prolonging agents (including but not limited to antiarrhythmics, macrolide antibiotics, flouroquinolones, tricyclic antidepressants, citalopram, specific antipsychotics, ondansetron, migraine triptans, and methadone).  Provided patient with educational materials on hydroxychloroquine and answered all questions.  Patient consented to hydroxychloroquine. Will upload consent in the media tab.    She has appt with dermatology around 01/27/2022 per patient.  Dose will be Plaquenil 200 mg twice daily Monday through Friday.  Prescription pending lab results.  Knox Saliva, PharmD, MPH, BCPS, CPP Clinical Pharmacist (Rheumatology and Pulmonology)

## 2022-01-09 NOTE — Progress Notes (Signed)
Protein creatinine ratio WNL.  RF negative.  Ro and La negative.   Several labs are still pending.

## 2022-01-14 ENCOUNTER — Other Ambulatory Visit: Payer: Self-pay | Admitting: *Deleted

## 2022-01-14 LAB — ANTI-NUCLEAR AB-TITER (ANA TITER): ANA Titer 1: 1:40 {titer} — ABNORMAL HIGH

## 2022-01-14 LAB — ANA: Anti Nuclear Antibody (ANA): POSITIVE — AB

## 2022-01-14 LAB — PROTEIN / CREATININE RATIO, URINE
Creatinine, Urine: 98 mg/dL (ref 20–275)
Protein/Creat Ratio: 61 mg/g creat (ref 24–184)
Protein/Creatinine Ratio: 0.061 mg/mg creat (ref 0.024–0.184)
Total Protein, Urine: 6 mg/dL (ref 5–24)

## 2022-01-14 LAB — CARDIOLIPIN ANTIBODIES, IGG, IGM, IGA
Anticardiolipin IgA: <2 APL-U/mL (ref <20.0)
Anticardiolipin IgG: 14.7 GPL-U/mL (ref <20.0)
Anticardiolipin IgM: <2 MPL-U/mL (ref <20.0)

## 2022-01-14 LAB — SJOGRENS SYNDROME-A EXTRACTABLE NUCLEAR ANTIBODY: SSA (Ro) (ENA) Antibody, IgG: <1 AI

## 2022-01-14 LAB — CYCLIC CITRUL PEPTIDE ANTIBODY, IGG: Cyclic Citrullin Peptide Ab: <16 UNITS

## 2022-01-14 LAB — RHEUMATOID FACTOR: Rheumatoid fact SerPl-aCnc: <14 IU/mL (ref <14)

## 2022-01-14 LAB — BETA-2 GLYCOPROTEIN ANTIBODIES
Beta-2 Glyco 1 IgA: <2 U/mL (ref <20.0)
Beta-2 Glyco 1 IgM: <2 U/mL (ref <20.0)
Beta-2 Glyco I IgG: 15 U/mL (ref <20.0)

## 2022-01-14 LAB — GLUCOSE 6 PHOSPHATE DEHYDROGENASE: G-6PDH: 7.1 U/g Hgb (ref 7.0–20.5)

## 2022-01-14 LAB — SJOGRENS SYNDROME-B EXTRACTABLE NUCLEAR ANTIBODY: SSB (La) (ENA) Antibody, IgG: <1 AI

## 2022-01-14 MED ORDER — HYDROXYCHLOROQUINE SULFATE 200 MG PO TABS: ORAL_TABLET | ORAL | 0 refills | Status: DC

## 2022-01-14 NOTE — Telephone Encounter (Signed)
Her dose of Plaquenil be 200 mg 1 tablet by mouth twice daily Monday through Friday.

## 2022-01-14 NOTE — Progress Notes (Signed)
ANA remains positive.   Anti-CCP is negative.   Anticardiolipin antibodies negative.  Beta-2 glycoprotein antibodies negative.  G6PDH WNL.  Ok to initiate plaquenil.

## 2022-01-14 NOTE — Telephone Encounter (Signed)
-----   Message from Ofilia Neas, PA-C sent at 01/14/2022 10:28 AM EST ----- ANA remains positive.   Anti-CCP is negative.   Anticardiolipin antibodies negative.  Beta-2 glycoprotein antibodies negative.  G6PDH WNL.  Ok to initiate plaquenil.

## 2022-01-18 ENCOUNTER — Other Ambulatory Visit: Payer: Self-pay | Admitting: Family Medicine

## 2022-01-18 DIAGNOSIS — R600 Localized edema: Secondary | ICD-10-CM

## 2022-01-31 ENCOUNTER — Ambulatory Visit: Payer: Medicare Other | Admitting: Obstetrics and Gynecology

## 2022-02-03 ENCOUNTER — Encounter: Payer: Self-pay | Admitting: Obstetrics and Gynecology

## 2022-02-03 ENCOUNTER — Ambulatory Visit (INDEPENDENT_AMBULATORY_CARE_PROVIDER_SITE_OTHER): Payer: PPO | Admitting: Obstetrics and Gynecology

## 2022-02-03 VITALS — BP 121/81 | HR 75 | Ht 65.0 in | Wt 182.0 lb

## 2022-02-03 DIAGNOSIS — Z01419 Encounter for gynecological examination (general) (routine) without abnormal findings: Secondary | ICD-10-CM | POA: Diagnosis not present

## 2022-02-03 NOTE — Progress Notes (Signed)
   ANNUAL EXAM Patient name: Kimberly Escobar MRN [id number]  Date of birth: 1956/01/06 Chief Complaint:   Annual Exam  History of Present Illness:   Kimberly Escobar is a 67 y.o. [phone] being seen today for a routine annual exam.  Current complaints: annual   No LMP recorded. Patient is postmenopausal.  No breast or nipple changes. Recent dx of lupus with rash and joint pain. No pelvic pain. No vaginal bleeding or discharge. No vaginal complaints.   Last pap 06/2019. Results were: NILM w/ HRHPV negative. H/O abnormal pap: no Last mammogram: 09/2021. BIRADS 1 DEXA 09/2021 Last colonoscopy: colorgarud negative 06/2021     12/31/2021    9:20 AM 06/06/2021    8:31 AM 04/16/2021    2:15 PM 08/02/2020   10:35 AM 05/31/2020    9:17 AM  Depression screen PHQ 2/9  Decreased Interest 0 0 0 0 0  Down, Depressed, Hopeless 0 0 0 0 0  PHQ - 2 Score 0 0 0 0 0         No data to display           Review of Systems:   Pertinent items are noted in HPI Denies any headaches, blurred vision, fatigue, shortness of breath, chest pain, abdominal pain, abnormal vaginal discharge/itching/odor/irritation, problems with periods, bowel movements, urination, or intercourse unless otherwise stated above. Pertinent History Reviewed:  Reviewed past medical,surgical, social and family history.  Reviewed problem list, medications and allergies. Physical Assessment:   Vitals:   02/03/22 0829  BP: 121/81  Pulse: 75  Weight: 182 lb (82.6 kg)  Height: '5\' 5"'$  (1.651 m)  Body mass index is 30.29 kg/m.        Physical Examination:   General appearance - well appearing, and in no distress  Mental status - alert, oriented to person, place, and time  Psych:  She has a normal mood and affect  Skin - warm and dry, normal color, no suspicious lesions noted  Chest - effort normal, all lung fields clear to auscultation bilaterally  Heart - normal rate and regular rhythm  Breasts - breasts appear normal, no  suspicious masses, no skin or nipple changes or  axillary nodes  Abdomen - soft, nontender, nondistended, no masses or organomegaly  Pelvic -  VULVA: normal appearing vulva with no masses, tenderness or lesions   VAGINA: atrophic introitus, nontender  Extremities:  No swelling or varicosities noted  Chaperone present for exam  No results found for this or any previous visit (from the past 24 hour(s)).    Assessment & Plan:   1. Well woman exam with routine gynecological exam No longer need pap Mammo and dexa UTD Not currently sexually active and without gyn complaints   No orders of the defined types were placed in this encounter.   Meds: No orders of the defined types were placed in this encounter.   Follow-up: No follow-ups on file.  Darliss Cheney, MD 02/04/2022 6:40 PM

## 2022-02-27 ENCOUNTER — Other Ambulatory Visit: Payer: Self-pay | Admitting: Family Medicine

## 2022-02-27 DIAGNOSIS — H6991 Unspecified Eustachian tube disorder, right ear: Secondary | ICD-10-CM
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
## 2022-04-09 ENCOUNTER — Encounter: Payer: Self-pay | Admitting: Physician Assistant

## 2022-04-09 ENCOUNTER — Ambulatory Visit: Payer: HMO | Attending: Physician Assistant | Admitting: Physician Assistant

## 2022-04-09 VITALS — BP 130/79 | HR 76 | Resp 16 | Ht 65.0 in | Wt 178.0 lb

## 2022-04-09 DIAGNOSIS — R768 Other specified abnormal immunological findings in serum: Secondary | ICD-10-CM | POA: Diagnosis not present

## 2022-04-09 DIAGNOSIS — G4709 Other insomnia: Secondary | ICD-10-CM | POA: Diagnosis not present

## 2022-04-09 DIAGNOSIS — Z79899 Other long term (current) drug therapy: Secondary | ICD-10-CM

## 2022-04-09 DIAGNOSIS — E785 Hyperlipidemia, unspecified: Secondary | ICD-10-CM

## 2022-04-09 DIAGNOSIS — Z9889 Other specified postprocedural states: Secondary | ICD-10-CM

## 2022-04-09 DIAGNOSIS — M7062 Trochanteric bursitis, left hip: Secondary | ICD-10-CM

## 2022-04-09 DIAGNOSIS — M19041 Primary osteoarthritis, right hand: Secondary | ICD-10-CM | POA: Diagnosis not present

## 2022-04-09 DIAGNOSIS — M170 Bilateral primary osteoarthritis of knee: Secondary | ICD-10-CM

## 2022-04-09 DIAGNOSIS — M359 Systemic involvement of connective tissue, unspecified: Secondary | ICD-10-CM

## 2022-04-09 DIAGNOSIS — Z8669 Personal history of other diseases of the nervous system and sense organs: Secondary | ICD-10-CM

## 2022-04-09 DIAGNOSIS — M533 Sacrococcygeal disorders, not elsewhere classified: Secondary | ICD-10-CM | POA: Diagnosis not present

## 2022-04-09 DIAGNOSIS — M797 Fibromyalgia: Secondary | ICD-10-CM | POA: Diagnosis not present

## 2022-04-09 DIAGNOSIS — M8589 Other specified disorders of bone density and structure, multiple sites: Secondary | ICD-10-CM

## 2022-04-09 DIAGNOSIS — M19042 Primary osteoarthritis, left hand: Secondary | ICD-10-CM

## 2022-04-09 DIAGNOSIS — M5030 Other cervical disc degeneration, unspecified cervical region: Secondary | ICD-10-CM | POA: Diagnosis not present

## 2022-04-09 DIAGNOSIS — R21 Rash and other nonspecific skin eruption: Secondary | ICD-10-CM | POA: Diagnosis not present

## 2022-04-09 DIAGNOSIS — G8929 Other chronic pain: Secondary | ICD-10-CM

## 2022-04-09 DIAGNOSIS — Z8719 Personal history of other diseases of the digestive system: Secondary | ICD-10-CM

## 2022-04-09 MED ORDER — HYDROXYCHLOROQUINE SULFATE 200 MG PO TABS: ORAL_TABLET | ORAL | 0 refills | Status: DC

## 2022-04-09 NOTE — Progress Notes (Signed)
CBC and CMP WNL.  ESR WNL.

## 2022-04-10 NOTE — Progress Notes (Signed)
No proteinuria

## 2022-04-11 LAB — CBC WITH DIFFERENTIAL/PLATELET
Absolute Monocytes: 389 cells/uL (ref 200–950)
Basophils Absolute: 41 cells/uL (ref 0–200)
Basophils Relative: 0.7 %
Eosinophils Absolute: 100 cells/uL (ref 15–500)
Eosinophils Relative: 1.7 %
HCT: 39.1 % (ref 35.0–45.0)
Hemoglobin: 13 g/dL (ref 11.7–15.5)
Lymphs Abs: 2667 cells/uL (ref 850–3900)
MCH: 31 pg (ref 27.0–33.0)
MCHC: 33.2 g/dL (ref 32.0–36.0)
MCV: 93.1 fL (ref 80.0–100.0)
MPV: 9.5 fL (ref 7.5–12.5)
Monocytes Relative: 6.6 %
Neutro Abs: 2702 cells/uL (ref 1500–7800)
Neutrophils Relative %: 45.8 %
Platelets: 311 10*3/uL (ref 140–400)
RBC: 4.2 10*6/uL (ref 3.80–5.10)
RDW: 12.4 % (ref 11.0–15.0)
Total Lymphocyte: 45.2 %
WBC: 5.9 10*3/uL (ref 3.8–10.8)

## 2022-04-11 LAB — C3 AND C4
C3 Complement: 138 mg/dL (ref 83–193)
C4 Complement: 27 mg/dL (ref 15–57)

## 2022-04-11 LAB — COMPLETE METABOLIC PANEL WITH GFR
AG Ratio: 1.5 (calc) (ref 1.0–2.5)
ALT: 24 U/L (ref 6–29)
AST: 16 U/L (ref 10–35)
Albumin: 4.4 g/dL (ref 3.6–5.1)
Alkaline phosphatase (APISO): 78 U/L (ref 37–153)
BUN: 14 mg/dL (ref 7–25)
CO2: 30 mmol/L (ref 20–32)
Calcium: 9.8 mg/dL (ref 8.6–10.4)
Chloride: 106 mmol/L (ref 98–110)
Creat: 0.79 mg/dL (ref 0.50–1.05)
Globulin: 3 g/dL (calc) (ref 1.9–3.7)
Glucose, Bld: 86 mg/dL (ref 65–99)
Potassium: 4.6 mmol/L (ref 3.5–5.3)
Sodium: 141 mmol/L (ref 135–146)
Total Bilirubin: 0.3 mg/dL (ref 0.2–1.2)
Total Protein: 7.4 g/dL (ref 6.1–8.1)
eGFR: 81 mL/min/{1.73_m2} (ref >=60)

## 2022-04-11 LAB — ANA: Anti Nuclear Antibody (ANA): POSITIVE — AB

## 2022-04-11 LAB — ANTI-NUCLEAR AB-TITER (ANA TITER)
ANA TITER: 1:80 {titer} — ABNORMAL HIGH
ANA Titer 1: 1:80 {titer} — ABNORMAL HIGH

## 2022-04-11 LAB — ANTI-DNA ANTIBODY, DOUBLE-STRANDED: ds DNA Ab: <1 [IU]/mL

## 2022-04-11 LAB — PROTEIN / CREATININE RATIO, URINE
Creatinine, Urine: 50 mg/dL (ref 20–275)
Protein/Creat Ratio: 80 mg/g{creat} (ref 24–184)
Protein/Creatinine Ratio: 0.08 mg/mg{creat} (ref 0.024–0.184)
Total Protein, Urine: 4 mg/dL — ABNORMAL LOW (ref 5–24)

## 2022-04-11 LAB — SEDIMENTATION RATE: Sed Rate: 11 mm/h (ref 0–30)

## 2022-04-11 LAB — ANTI-SMITH ANTIBODY: ENA SM Ab Ser-aCnc: 4.6 AI — AB

## 2022-04-11 LAB — RNP ANTIBODY: Ribonucleic Protein(ENA) Antibody, IgG: 1.4 AI — AB

## 2022-04-11 NOTE — Progress Notes (Signed)
RNP remains positive.   Smith antibody remains positive.   dsDNA is negative.  ANA is pending

## 2022-04-14 NOTE — Progress Notes (Signed)
ANA remains positive.  We will continue to monitor lab work closely. Continue taking plaquenil as prescribed.

## 2022-04-17 DIAGNOSIS — D239 Other benign neoplasm of skin, unspecified: Secondary | ICD-10-CM | POA: Diagnosis not present

## 2022-04-17 DIAGNOSIS — L80 Vitiligo: Secondary | ICD-10-CM | POA: Diagnosis not present

## 2022-04-17 DIAGNOSIS — D485 Neoplasm of uncertain behavior of skin: Secondary | ICD-10-CM | POA: Diagnosis not present

## 2022-04-17 DIAGNOSIS — D1801 Hemangioma of skin and subcutaneous tissue: Secondary | ICD-10-CM | POA: Diagnosis not present

## 2022-04-17 DIAGNOSIS — L930 Discoid lupus erythematosus: Secondary | ICD-10-CM | POA: Diagnosis not present

## 2022-04-17 DIAGNOSIS — L821 Other seborrheic keratosis: Secondary | ICD-10-CM | POA: Diagnosis not present

## 2022-04-17 DIAGNOSIS — L578 Other skin changes due to chronic exposure to nonionizing radiation: Secondary | ICD-10-CM | POA: Diagnosis not present

## 2022-04-17 DIAGNOSIS — L739 Follicular disorder, unspecified: Secondary | ICD-10-CM | POA: Diagnosis not present

## 2022-04-17 DIAGNOSIS — D2272 Melanocytic nevi of left lower limb, including hip: Secondary | ICD-10-CM | POA: Diagnosis not present

## 2022-04-17 DIAGNOSIS — L814 Other melanin hyperpigmentation: Secondary | ICD-10-CM | POA: Diagnosis not present

## 2022-05-01 DIAGNOSIS — L905 Scar conditions and fibrosis of skin: Secondary | ICD-10-CM | POA: Diagnosis not present

## 2022-05-01 DIAGNOSIS — D485 Neoplasm of uncertain behavior of skin: Secondary | ICD-10-CM | POA: Diagnosis not present

## 2022-05-15 ENCOUNTER — Telehealth: Payer: Self-pay | Admitting: Family Medicine

## 2022-05-15 NOTE — Telephone Encounter (Signed)
Copied from CRM [id number]. Topic: Medicare AWV >> May 15, 2022  3:19 PM Payton Doughty wrote: Reason for CRM: Called patient to schedule Medicare Annual Wellness Visit (AWV). Left message for patient to call back and schedule Medicare Annual Wellness Visit (AWV).  Last date of AWV: 06/07/22  Please schedule an appointment at any time with Donne Anon, CMA  .  If any questions, please contact me.  Thank you ,  Verlee Rossetti; Care Guide Ambulatory Clinical Support  l [name] Direct Dial: [phone]

## 2022-06-03 ENCOUNTER — Encounter: Payer: Self-pay | Admitting: Family Medicine

## 2022-06-03 ENCOUNTER — Ambulatory Visit (INDEPENDENT_AMBULATORY_CARE_PROVIDER_SITE_OTHER): Payer: HMO | Admitting: Family Medicine

## 2022-06-03 VITALS — BP 102/80 | HR 86 | Temp 98.8°F | Resp 16 | Ht 65.0 in | Wt 180.0 lb

## 2022-06-03 DIAGNOSIS — M329 Systemic lupus erythematosus, unspecified: Secondary | ICD-10-CM | POA: Diagnosis not present

## 2022-06-03 DIAGNOSIS — M351 Other overlap syndromes: Secondary | ICD-10-CM | POA: Insufficient documentation

## 2022-06-03 DIAGNOSIS — R1013 Epigastric pain: Secondary | ICD-10-CM | POA: Diagnosis not present

## 2022-06-03 DIAGNOSIS — E7800 Pure hypercholesterolemia, unspecified: Secondary | ICD-10-CM | POA: Diagnosis not present

## 2022-06-03 DIAGNOSIS — I10 Essential (primary) hypertension: Secondary | ICD-10-CM | POA: Diagnosis not present

## 2022-06-03 MED ORDER — PANTOPRAZOLE SODIUM 40 MG PO TBEC: 40.0000 mg | DELAYED_RELEASE_TABLET | Freq: Every day | ORAL | 3 refills | Status: DC

## 2022-06-03 MED ORDER — RED YEAST RICE 55 MG PO CAPS: 1.0000 | ORAL_CAPSULE | ORAL | Status: AC

## 2022-06-03 MED ORDER — TYROSINE 500 MG PO CAPS: 1.0000 | ORAL_CAPSULE | Freq: Every day | ORAL | 0 refills | Status: DC

## 2022-06-03 NOTE — Assessment & Plan Note (Signed)
Per rheum 

## 2022-06-03 NOTE — Assessment & Plan Note (Signed)
Well controlled, no changes to meds. Encouraged heart healthy diet such as the DASH diet and exercise as tolerated.  °
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
## 2022-06-03 NOTE — Assessment & Plan Note (Signed)
Encourage heart healthy diet such as MIND or DASH diet, increase exercise, avoid trans fats, simple carbohydrates and processed foods, consider a krill or fish or flaxseed oil cap daily.  °

## 2022-06-03 NOTE — Patient Instructions (Signed)
Food Choices for Gastroesophageal Reflux Disease, Adult When you have gastroesophageal reflux disease (GERD), the foods you eat and your eating habits are very important. Choosing the right foods can help ease the discomfort of GERD. Consider working with a dietitian to help you make healthy food choices. What are tips for following this plan? Reading food labels Look for foods that are low in saturated fat. Foods that have less than 5% of daily value (DV) of fat and 0 g of trans fats may help with your symptoms. Cooking Cook foods using methods other than frying. This may include baking, steaming, grilling, or broiling. These are all methods that do not need a lot of fat for cooking. To add flavor, try to use herbs that are low in spice and acidity. Meal planning  Choose healthy foods that are low in fat, such as fruits, vegetables, whole grains, low-fat dairy products, lean meats, fish, and poultry. Eat frequent, small meals instead of three large meals each day. Eat your meals slowly, in a relaxed setting. Avoid bending over or lying down until 2-3 hours after eating. Limit high-fat foods such as fatty meats or fried foods. Limit your intake of fatty foods, such as oils, butter, and shortening. Avoid the following as told by your health care provider: Foods that cause symptoms. These may be different for different people. Keep a food diary to keep track of foods that cause symptoms. Alcohol. Drinking large amounts of liquid with meals. Eating meals during the 2-3 hours before bed. Lifestyle Maintain a healthy weight. Ask your health care provider what weight is healthy for you. If you need to lose weight, work with your health care provider to do so safely. Exercise for at least 30 minutes on 5 or more days each week, or as told by your health care provider. Avoid wearing clothes that fit tightly around your waist and chest. Do not use any products that contain nicotine or tobacco. These  products include cigarettes, chewing tobacco, and vaping devices, such as e-cigarettes. If you need help quitting, ask your health care provider. Sleep with the head of your bed raised. Use a wedge under the mattress or blocks under the bed frame to raise the head of the bed. Chew sugar-free gum after mealtimes. What foods should I eat?  Eat a healthy, well-balanced diet of fruits, vegetables, whole grains, low-fat dairy products, lean meats, fish, and poultry. Each person is different. Foods that may trigger symptoms in one person may not trigger any symptoms in another person. Work with your health care provider to identify foods that are safe for you. The items listed above may not be a complete list of recommended foods and beverages. Contact a dietitian for more information. What foods should I avoid? Limiting some of these foods may help manage the symptoms of GERD. Everyone is different. Consult a dietitian or your health care provider to help you identify the exact foods to avoid, if any. Fruits Any fruits prepared with added fat. Any fruits that cause symptoms. For some people this may include citrus fruits, such as oranges, grapefruit, pineapple, and lemons. Vegetables Deep-fried vegetables. French fries. Any vegetables prepared with added fat. Any vegetables that cause symptoms. For some people, this may include tomatoes and tomato products, chili peppers, onions and garlic, and horseradish. Grains Pastries or quick breads with added fat. Meats and other proteins High-fat meats, such as fatty beef or pork, hot dogs, ribs, ham, sausage, salami, and bacon. Fried meat or protein, including   fried fish and fried chicken. Nuts and nut butters, in large amounts. Dairy Whole milk and chocolate milk. Sour cream. Cream. Ice cream. Cream cheese. Milkshakes. Fats and oils Butter. Margarine. Shortening. Ghee. Beverages Coffee and tea, with or without caffeine. Carbonated beverages. Sodas. Energy  drinks. Fruit juice made with acidic fruits, such as orange or grapefruit. Tomato juice. Alcoholic drinks. Sweets and desserts Chocolate and cocoa. Donuts. Seasonings and condiments Pepper. Peppermint and spearmint. Added salt. Any condiments, herbs, or seasonings that cause symptoms. For some people, this may include curry, hot sauce, or vinegar-based salad dressings. The items listed above may not be a complete list of foods and beverages to avoid. Contact a dietitian for more information. Questions to ask your health care provider Diet and lifestyle changes are usually the first steps that are taken to manage symptoms of GERD. If diet and lifestyle changes do not improve your symptoms, talk with your health care provider about taking medicines. Where to find more information International Foundation for Gastrointestinal Disorders: aboutgerd.org Summary When you have gastroesophageal reflux disease (GERD), food and lifestyle choices may be very helpful in easing the discomfort of GERD. Eat frequent, small meals instead of three large meals each day. Eat your meals slowly, in a relaxed setting. Avoid bending over or lying down until 2-3 hours after eating. Limit high-fat foods such as fatty meats or fried foods. This information is not intended to replace advice given to you by your health care provider. Make sure you discuss any questions you have with your health care provider. Document Revised: 07/11/2019 Document Reviewed: 07/11/2019 Elsevier Patient Education  2023 Elsevier Inc.  

## 2022-06-13 ENCOUNTER — Ambulatory Visit: Payer: Medicare Other | Admitting: Rheumatology

## 2022-06-25 ENCOUNTER — Telehealth: Payer: Self-pay | Admitting: Family Medicine

## 2022-06-25 NOTE — Telephone Encounter (Signed)
Copied from CRM [id number]. Topic: Medicare AWV >> Jun 25, 2022 11:35 AM Payton Doughty wrote: Reason for CRM: LM 06/25/2022 to schedule AWV   Verlee Rossetti; Care Guide Ambulatory Clinical Support [place] [facility] Direct Dial: [phone]
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
## 2022-07-03 ENCOUNTER — Encounter: Payer: Self-pay | Admitting: Gastroenterology

## 2022-07-10 ENCOUNTER — Ambulatory Visit: Payer: HMO | Attending: Physician Assistant | Admitting: Physician Assistant

## 2022-07-10 ENCOUNTER — Encounter: Payer: Self-pay | Admitting: Physician Assistant

## 2022-07-10 VITALS — BP 113/75 | HR 77 | Resp 14 | Ht 65.0 in | Wt 179.4 lb

## 2022-07-10 DIAGNOSIS — M7062 Trochanteric bursitis, left hip: Secondary | ICD-10-CM

## 2022-07-10 DIAGNOSIS — Z9889 Other specified postprocedural states: Secondary | ICD-10-CM | POA: Diagnosis not present

## 2022-07-10 DIAGNOSIS — Z8719 Personal history of other diseases of the digestive system: Secondary | ICD-10-CM

## 2022-07-10 DIAGNOSIS — M8589 Other specified disorders of bone density and structure, multiple sites: Secondary | ICD-10-CM

## 2022-07-10 DIAGNOSIS — M170 Bilateral primary osteoarthritis of knee: Secondary | ICD-10-CM

## 2022-07-10 DIAGNOSIS — Z79899 Other long term (current) drug therapy: Secondary | ICD-10-CM

## 2022-07-10 DIAGNOSIS — G8929 Other chronic pain: Secondary | ICD-10-CM

## 2022-07-10 DIAGNOSIS — M359 Systemic involvement of connective tissue, unspecified: Secondary | ICD-10-CM

## 2022-07-10 DIAGNOSIS — M19041 Primary osteoarthritis, right hand: Secondary | ICD-10-CM | POA: Diagnosis not present

## 2022-07-10 DIAGNOSIS — M533 Sacrococcygeal disorders, not elsewhere classified: Secondary | ICD-10-CM | POA: Diagnosis not present

## 2022-07-10 DIAGNOSIS — E785 Hyperlipidemia, unspecified: Secondary | ICD-10-CM

## 2022-07-10 DIAGNOSIS — M19042 Primary osteoarthritis, left hand: Secondary | ICD-10-CM

## 2022-07-10 DIAGNOSIS — M797 Fibromyalgia: Secondary | ICD-10-CM | POA: Diagnosis not present

## 2022-07-10 DIAGNOSIS — M5030 Other cervical disc degeneration, unspecified cervical region: Secondary | ICD-10-CM | POA: Diagnosis not present

## 2022-07-10 DIAGNOSIS — G4709 Other insomnia: Secondary | ICD-10-CM

## 2022-07-10 DIAGNOSIS — Z8669 Personal history of other diseases of the nervous system and sense organs: Secondary | ICD-10-CM

## 2022-07-10 LAB — CBC WITH DIFFERENTIAL/PLATELET
Eosinophils Absolute: 111 cells/uL (ref 15–500)
Hemoglobin: 13.8 g/dL (ref 11.7–15.5)
Lymphs Abs: 1956 cells/uL (ref 850–3900)
MCH: 31.7 pg (ref 27.0–33.0)
MCHC: 32.9 g/dL (ref 32.0–36.0)
Neutro Abs: 2745 cells/uL (ref 1500–7800)
Neutrophils Relative %: 51.8 %
Total Lymphocyte: 36.9 %

## 2022-07-10 MED ORDER — HYDROXYCHLOROQUINE SULFATE 200 MG PO TABS: ORAL_TABLET | ORAL | 0 refills | Status: DC

## 2022-07-11 LAB — C3 AND C4
C3 Complement: 156 mg/dL (ref 83–193)
C4 Complement: 27 mg/dL (ref 15–57)

## 2022-07-11 LAB — CBC WITH DIFFERENTIAL/PLATELET
Absolute Monocytes: 435 cells/uL (ref 200–950)
Basophils Absolute: 53 cells/uL (ref 0–200)
Basophils Relative: 1 %
Eosinophils Relative: 2.1 %
HCT: 41.9 % (ref 35.0–45.0)
MCV: 96.3 fL (ref 80.0–100.0)
MPV: 9.5 fL (ref 7.5–12.5)
Monocytes Relative: 8.2 %
Platelets: 284 10*3/uL (ref 140–400)
RBC: 4.35 10*6/uL (ref 3.80–5.10)
RDW: 12.2 % (ref 11.0–15.0)
WBC: 5.3 10*3/uL (ref 3.8–10.8)

## 2022-07-11 LAB — COMPLETE METABOLIC PANEL WITH GFR
AG Ratio: 1.6 (calc) (ref 1.0–2.5)
ALT: 23 U/L (ref 6–29)
AST: 14 U/L (ref 10–35)
Albumin: 4.6 g/dL (ref 3.6–5.1)
Alkaline phosphatase (APISO): 93 U/L (ref 37–153)
BUN: 14 mg/dL (ref 7–25)
CO2: 26 mmol/L (ref 20–32)
Calcium: 10 mg/dL (ref 8.6–10.4)
Chloride: 106 mmol/L (ref 98–110)
Creat: 0.74 mg/dL (ref 0.50–1.05)
Globulin: 2.9 g/dL (calc) (ref 1.9–3.7)
Glucose, Bld: 78 mg/dL (ref 65–99)
Potassium: 4.2 mmol/L (ref 3.5–5.3)
Sodium: 142 mmol/L (ref 135–146)
Total Bilirubin: 0.4 mg/dL (ref 0.2–1.2)
Total Protein: 7.5 g/dL (ref 6.1–8.1)
eGFR: 88 mL/min/{1.73_m2} (ref >=60)

## 2022-07-11 LAB — SEDIMENTATION RATE: Sed Rate: 6 mm/h (ref 0–30)

## 2022-07-11 LAB — C-REACTIVE PROTEIN: CRP: 3.7 mg/L (ref <8.0)

## 2022-07-11 LAB — PROTEIN / CREATININE RATIO, URINE
Creatinine, Urine: 136 mg/dL (ref 20–275)
Protein/Creat Ratio: 88 mg/g creat (ref 24–184)
Protein/Creatinine Ratio: 0.088 mg/mg creat (ref 0.024–0.184)
Total Protein, Urine: 12 mg/dL (ref 5–24)

## 2022-07-11 LAB — ANTI-DNA ANTIBODY, DOUBLE-STRANDED: ds DNA Ab: <1 IU/mL

## 2022-07-11 NOTE — Progress Notes (Signed)
CRP WNL

## 2022-07-11 NOTE — Progress Notes (Signed)
CBC and CMP WNL.  ESR WNL.  Protein creatinine ratio WNL.  Complements WNL.  

## 2022-07-14 NOTE — Progress Notes (Signed)
dsDNA is negative

## 2022-07-31 ENCOUNTER — Encounter: Payer: Self-pay | Admitting: Family Medicine

## 2022-07-31 ENCOUNTER — Ambulatory Visit (INDEPENDENT_AMBULATORY_CARE_PROVIDER_SITE_OTHER): Payer: HMO | Admitting: Family Medicine

## 2022-07-31 VITALS — BP 130/80 | HR 75 | Ht 65.0 in | Wt 179.0 lb

## 2022-07-31 DIAGNOSIS — M19049 Primary osteoarthritis, unspecified hand: Secondary | ICD-10-CM | POA: Diagnosis not present

## 2022-07-31 DIAGNOSIS — M25561 Pain in right knee: Secondary | ICD-10-CM | POA: Diagnosis not present

## 2022-07-31 DIAGNOSIS — M25562 Pain in left knee: Secondary | ICD-10-CM

## 2022-07-31 DIAGNOSIS — G8929 Other chronic pain: Secondary | ICD-10-CM | POA: Diagnosis not present

## 2022-07-31 MED ORDER — MELOXICAM 15 MG PO TABS
15.0000 mg | ORAL_TABLET | Freq: Every day | ORAL | 2 refills | Status: DC
Start: 2022-07-31 — End: 2022-08-01
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
## 2022-07-31 NOTE — Patient Instructions (Addendum)
Your pain is due to arthritis. These are the different medications you can take for this: Tylenol 500mg  1-2 tabs three times a day for pain. Capsaicin, aspercreme, or biofreeze topically up to four times a day may also help with pain. Some supplements that may help for arthritis: Boswellia extract, curcumin, pycnogenol Meloxicam 15mg  daily with food for pain and inflammation. Follow up with me as needed if you're doing well.

## 2022-08-01 ENCOUNTER — Other Ambulatory Visit: Payer: Self-pay | Admitting: *Deleted

## 2022-08-01 ENCOUNTER — Encounter: Payer: Self-pay | Admitting: Family Medicine

## 2022-08-01 MED ORDER — DICLOFENAC SODIUM 75 MG PO TBEC: DELAYED_RELEASE_TABLET | ORAL | 0 refills | Status: DC

## 2022-08-02 ENCOUNTER — Other Ambulatory Visit: Payer: Self-pay | Admitting: Family Medicine

## 2022-08-02 DIAGNOSIS — Z8619 Personal history of other infectious and parasitic diseases: Secondary | ICD-10-CM

## 2022-08-04 DIAGNOSIS — H10023 Other mucopurulent conjunctivitis, bilateral: Secondary | ICD-10-CM | POA: Diagnosis not present

## 2022-08-06 ENCOUNTER — Ambulatory Visit (INDEPENDENT_AMBULATORY_CARE_PROVIDER_SITE_OTHER): Payer: HMO | Admitting: Gastroenterology

## 2022-08-06 ENCOUNTER — Encounter: Payer: Self-pay | Admitting: Gastroenterology

## 2022-08-06 VITALS — BP 104/62 | HR 84 | Ht 64.25 in | Wt 181.4 lb

## 2022-08-06 DIAGNOSIS — R6881 Early satiety: Secondary | ICD-10-CM | POA: Diagnosis not present

## 2022-08-06 DIAGNOSIS — K219 Gastro-esophageal reflux disease without esophagitis: Secondary | ICD-10-CM

## 2022-08-06 DIAGNOSIS — Z8619 Personal history of other infectious and parasitic diseases: Secondary | ICD-10-CM

## 2022-08-06 DIAGNOSIS — Z1211 Encounter for screening for malignant neoplasm of colon: Secondary | ICD-10-CM | POA: Diagnosis not present

## 2022-08-06 DIAGNOSIS — R1310 Dysphagia, unspecified: Secondary | ICD-10-CM | POA: Diagnosis not present

## 2022-08-06 DIAGNOSIS — R1110 Vomiting, unspecified: Secondary | ICD-10-CM

## 2022-08-06 MED ORDER — NA SULFATE-K SULFATE-MG SULF 17.5-3.13-1.6 GM/177ML PO SOLN: 1.0000 | ORAL | 0 refills | Status: DC

## 2022-08-06 NOTE — Patient Instructions (Signed)
We have sent the following medications to your pharmacy for you to pick up at your convenience: Suprep   You have been scheduled for a Barium Esophogram at [facility]iology (1st floor of the hospital) on 08/29/22 at 10:00 am . Please arrive 30 minutes prior to your appointment for registration. Make certain not to have anything to eat or drink 3 hours prior to your test. If you need to reschedule for any reason, please contact radiology at [phone] to do so. __________________________________________________________________ A barium swallow is an examination that concentrates on views of the esophagus. This tends to be a double contrast exam (barium and two liquids which, when combined, create a gas to distend the wall of the oesophagus) or single contrast (non-ionic iodine based). The study is usually tailored to your symptoms so a good history is essential. Attention is paid during the study to the form, structure and configuration of the esophagus, looking for functional disorders (such as aspiration, dysphagia, achalasia, motility and reflux) EXAMINATION You may be asked to change into a gown, depending on the type of swallow being performed. A radiologist and radiographer will perform the procedure. The radiologist will advise you of the type of contrast selected for your procedure and direct you during the exam. You will be asked to stand, sit or lie in several different positions and to hold a small amount of fluid in your mouth before being asked to swallow while the imaging is performed .In some instances you may be asked to swallow barium coated marshmallows to assess the motility of a solid food bolus. The exam can be recorded as a digital or video fluoroscopy procedure. POST PROCEDURE It will take 1-2 days for the barium to pass through your system. To facilitate this, it is important, unless otherwise directed, to increase your fluids for the next 24-48hrs and to resume your normal  diet.  This test typically takes about 30 minutes to perform. __________________________________________________________________________________  Bonita Quin have been scheduled for an endoscopy and colonoscopy. Please follow the written instructions given to you at your visit today.  Please pick up your prep supplies at the pharmacy within the next 1-3 days.  If you use inhalers (even only as needed), please bring them with you on the day of your procedure.  DO NOT TAKE 7 DAYS PRIOR TO TEST- Trulicity (dulaglutide) Ozempic, Wegovy (semaglutide) Mounjaro (tirzepatide) Bydureon Bcise (exanatide extended release)  DO NOT TAKE 1 DAY PRIOR TO YOUR TEST Rybelsus (semaglutide) Adlyxin (lixisenatide) Victoza (liraglutide) Byetta (exanatide) ___________________________________________________________________________  Due to recent changes in healthcare laws, you may see the results of your imaging and laboratory studies on MyChart before your provider has had a chance to review them.  We understand that in some cases there may be results that are confusing or concerning to you. Not all laboratory results come back in the same time frame and the provider may be waiting for multiple results in order to interpret others.  Please give [place] 48 hours in order for your provider to thoroughly review all the results before contacting the office for clarification of your results.   Thank you for choosing me and [facility].  Dr. Meridee Score
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
## 2022-08-08 DIAGNOSIS — R1110 Vomiting, unspecified: Secondary | ICD-10-CM | POA: Insufficient documentation

## 2022-08-08 DIAGNOSIS — R6881 Early satiety: Secondary | ICD-10-CM | POA: Insufficient documentation

## 2022-08-08 DIAGNOSIS — Z8619 Personal history of other infectious and parasitic diseases: Secondary | ICD-10-CM | POA: Insufficient documentation

## 2022-08-08 DIAGNOSIS — Z1211 Encounter for screening for malignant neoplasm of colon: Secondary | ICD-10-CM | POA: Insufficient documentation

## 2022-08-08 DIAGNOSIS — R1310 Dysphagia, unspecified: Secondary | ICD-10-CM | POA: Insufficient documentation

## 2022-08-19 ENCOUNTER — Encounter: Payer: Self-pay | Admitting: Family

## 2022-08-19 ENCOUNTER — Other Ambulatory Visit: Payer: Self-pay | Admitting: Family

## 2022-08-19 ENCOUNTER — Ambulatory Visit (INDEPENDENT_AMBULATORY_CARE_PROVIDER_SITE_OTHER): Payer: HMO | Admitting: Family

## 2022-08-19 VITALS — BP 138/78 | HR 92 | Temp 99.6°F | Ht 64.5 in | Wt 179.0 lb

## 2022-08-19 DIAGNOSIS — J069 Acute upper respiratory infection, unspecified: Secondary | ICD-10-CM

## 2022-08-19 MED ORDER — BENZONATATE 100 MG PO CAPS: 100.0000 mg | ORAL_CAPSULE | Freq: Three times a day (TID) | ORAL | 0 refills | Status: DC | PRN
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
## 2022-08-21 ENCOUNTER — Ambulatory Visit: Payer: HMO | Admitting: Family Medicine

## 2022-08-26 DIAGNOSIS — Z79899 Other long term (current) drug therapy: Secondary | ICD-10-CM | POA: Diagnosis not present

## 2022-08-26 DIAGNOSIS — M3210 Systemic lupus erythematosus, organ or system involvement unspecified: Secondary | ICD-10-CM | POA: Diagnosis not present

## 2022-08-29 ENCOUNTER — Ambulatory Visit (HOSPITAL_COMMUNITY)
Admission: RE | Admit: 2022-08-29 | Discharge: 2022-08-29 | Disposition: A | Discharge status: Home / Self Care | Payer: HMO | Source: Ambulatory Visit | Attending: Gastroenterology | Admitting: Gastroenterology

## 2022-08-29 DIAGNOSIS — R1310 Dysphagia, unspecified: Secondary | ICD-10-CM

## 2022-08-29 DIAGNOSIS — R1110 Vomiting, unspecified: Secondary | ICD-10-CM | POA: Diagnosis not present

## 2022-09-01 ENCOUNTER — Telehealth: Payer: Self-pay | Admitting: Gastroenterology

## 2022-09-01 MED ORDER — NA SULFATE-K SULFATE-MG SULF 17.5-3.13-1.6 GM/177ML PO SOLN: 1.0000 | ORAL | 0 refills | Status: DC

## 2022-09-01 NOTE — Addendum Note (Signed)
Addended byLucky Rathke on: 09/01/2022 09:23 AM   Modules accepted: Orders

## 2022-09-01 NOTE — Telephone Encounter (Signed)
Prescription for Suprep re-sent to patient's pharmacy- [facility].

## 2022-09-01 NOTE — Telephone Encounter (Signed)
Patient needs another prep sent into the pharmacy due to her taking prep by mistake. Please advise.   Thank you

## 2022-09-03 ENCOUNTER — Other Ambulatory Visit: Payer: Self-pay | Admitting: Family Medicine

## 2022-09-10 ENCOUNTER — Ambulatory Visit (INDEPENDENT_AMBULATORY_CARE_PROVIDER_SITE_OTHER): Payer: HMO | Admitting: Family Medicine

## 2022-09-10 VITALS — BP 118/84 | Ht 64.5 in | Wt 177.0 lb

## 2022-09-10 DIAGNOSIS — M25562 Pain in left knee: Secondary | ICD-10-CM

## 2022-09-10 DIAGNOSIS — M25561 Pain in right knee: Secondary | ICD-10-CM

## 2022-09-10 DIAGNOSIS — G8929 Other chronic pain: Secondary | ICD-10-CM | POA: Diagnosis not present

## 2022-09-10 DIAGNOSIS — M19049 Primary osteoarthritis, unspecified hand: Secondary | ICD-10-CM | POA: Diagnosis not present

## 2022-09-10 NOTE — Patient Instructions (Signed)
Continue the turmeric - I would take maximum 1000mg  twice a day. Add boswellia extract - you should be able to get this at [facility] and take as directed. Continue with rheumatology follow-ups. If one particular joint is bothersome we could give you a steroid injection in that joint. Icing 15 minutes at a time if needed. Follow up with me as needed.
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
## 2022-09-11 ENCOUNTER — Encounter: Payer: Self-pay | Admitting: Family Medicine
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
## 2022-09-22 ENCOUNTER — Telehealth: Payer: Self-pay | Admitting: Gastroenterology

## 2022-09-22 ENCOUNTER — Ambulatory Visit (INDEPENDENT_AMBULATORY_CARE_PROVIDER_SITE_OTHER): Payer: HMO | Admitting: Cardiology

## 2022-09-22 ENCOUNTER — Encounter: Payer: Self-pay | Admitting: Cardiology

## 2022-09-22 VITALS — BP 130/82 | HR 77 | Ht 64.5 in | Wt 180.4 lb

## 2022-09-22 DIAGNOSIS — E7800 Pure hypercholesterolemia, unspecified: Secondary | ICD-10-CM | POA: Diagnosis not present

## 2022-09-22 DIAGNOSIS — R600 Localized edema: Secondary | ICD-10-CM | POA: Diagnosis not present

## 2022-09-22 NOTE — Patient Instructions (Signed)

## 2022-09-22 NOTE — Telephone Encounter (Signed)
Tried to return call to patient. Had to leave voicemail.

## 2022-09-22 NOTE — Telephone Encounter (Signed)
Inbound call from patient stating she would like to Miralax and Gatorade prep rather than Suprep for 10/1 colonoscopy. Patient requesting a call back to discuss further. Please advise, thank you.

## 2022-09-29 NOTE — Telephone Encounter (Signed)
Left message for patient

## 2022-09-30 ENCOUNTER — Encounter: Payer: Self-pay | Admitting: Gastroenterology

## 2022-10-03 NOTE — Telephone Encounter (Signed)
Left message for patient

## 2022-10-14 ENCOUNTER — Encounter: Payer: Self-pay | Admitting: Gastroenterology

## 2022-10-14 ENCOUNTER — Ambulatory Visit: Payer: HMO | Admitting: Gastroenterology

## 2022-10-14 VITALS — BP 113/87 | HR 86 | Temp 97.3°F | Resp 12 | Ht 64.0 in | Wt 188.0 lb

## 2022-10-14 DIAGNOSIS — D123 Benign neoplasm of transverse colon: Secondary | ICD-10-CM

## 2022-10-14 DIAGNOSIS — I10 Essential (primary) hypertension: Secondary | ICD-10-CM | POA: Diagnosis not present

## 2022-10-14 DIAGNOSIS — D125 Benign neoplasm of sigmoid colon: Secondary | ICD-10-CM

## 2022-10-14 DIAGNOSIS — K31A0 Gastric intestinal metaplasia, unspecified: Secondary | ICD-10-CM

## 2022-10-14 DIAGNOSIS — K259 Gastric ulcer, unspecified as acute or chronic, without hemorrhage or perforation: Secondary | ICD-10-CM

## 2022-10-14 DIAGNOSIS — Z1211 Encounter for screening for malignant neoplasm of colon: Secondary | ICD-10-CM

## 2022-10-14 DIAGNOSIS — K635 Polyp of colon: Secondary | ICD-10-CM | POA: Diagnosis not present

## 2022-10-14 DIAGNOSIS — E785 Hyperlipidemia, unspecified: Secondary | ICD-10-CM | POA: Diagnosis not present

## 2022-10-14 DIAGNOSIS — K2980 Duodenitis without bleeding: Secondary | ICD-10-CM | POA: Diagnosis not present

## 2022-10-14 DIAGNOSIS — R1310 Dysphagia, unspecified: Secondary | ICD-10-CM | POA: Diagnosis not present

## 2022-10-14 DIAGNOSIS — M797 Fibromyalgia: Secondary | ICD-10-CM | POA: Diagnosis not present

## 2022-10-14 DIAGNOSIS — D124 Benign neoplasm of descending colon: Secondary | ICD-10-CM | POA: Diagnosis not present

## 2022-10-14 DIAGNOSIS — K2990 Gastroduodenitis, unspecified, without bleeding: Secondary | ICD-10-CM

## 2022-10-14 DIAGNOSIS — K2950 Unspecified chronic gastritis without bleeding: Secondary | ICD-10-CM | POA: Diagnosis not present

## 2022-10-14 HISTORY — PX: COLONOSCOPY W/ POLYPECTOMY: SHX1380

## 2022-10-14 HISTORY — PX: UPPER GI ENDOSCOPY: SHX6162

## 2022-10-14 HISTORY — DX: Gastric ulcer, unspecified as acute or chronic, without hemorrhage or perforation: K25.9

## 2022-10-14 MED ORDER — PANTOPRAZOLE SODIUM 40 MG PO TBEC: 40.0000 mg | DELAYED_RELEASE_TABLET | Freq: Two times a day (BID) | ORAL | 3 refills | Status: DC

## 2022-10-14 MED ORDER — SODIUM CHLORIDE 0.9 % IV SOLN: 500.0000 mL | Freq: Once | INTRAVENOUS | Status: DC

## 2022-10-14 MED ORDER — SUCRALFATE 1 GM/10ML PO SUSP: 1.0000 g | Freq: Two times a day (BID) | ORAL | 1 refills | Status: DC

## 2022-10-14 NOTE — Patient Instructions (Addendum)
Handouts Provided:  Polyps, High Fiber Diet and Post Dilation Diet.  Use FiberCon 1-2 Tablets by mouth daily.  INCREASE pantoprazole 40 mg twice daily.  START Carafate 1 g liquid twice daily 1 month.  We will call you schedule repeat EGD once schedule is out for February.  YOU HAD AN ENDOSCOPIC PROCEDURE TODAY AT THE [facility]:   Refer to the procedure report that was given to you for any specific questions about what was found during the examination.  If the procedure report does not answer your questions, please call your gastroenterologist to clarify.  If you requested that your care partner not be given the details of your procedure findings, then the procedure report has been included in a sealed envelope for you to review at your convenience later.  YOU SHOULD EXPECT: Some feelings of bloating in the abdomen. Passage of more gas than usual.  Walking can help get rid of the air that was put into your GI tract during the procedure and reduce the bloating. If you had a lower endoscopy (such as a colonoscopy or flexible sigmoidoscopy) you may notice spotting of blood in your stool or on the toilet paper. If you underwent a bowel prep for your procedure, you may not have a normal bowel movement for a few days.  Please Note:  You might notice some irritation and congestion in your nose or some drainage.  This is from the oxygen used during your procedure.  There is no need for concern and it should clear up in a day or so.  SYMPTOMS TO REPORT IMMEDIATELY:  Following lower endoscopy (colonoscopy or flexible sigmoidoscopy):  Excessive amounts of blood in the stool  Significant tenderness or worsening of abdominal pains  Swelling of the abdomen that is new, acute  Fever of 100F or higher  Following upper endoscopy (EGD)  Vomiting of blood or coffee ground material  New chest pain or pain under the shoulder blades  Painful or persistently difficult swallowing  New shortness of  breath  Fever of 100F or higher  Black, tarry-looking stools  For urgent or emergent issues, a gastroenterologist can be reached at any hour by calling [phone]. Do not use MyChart messaging for urgent concerns.    DIET:  Please see Post Dilation diet provided.  Drink plenty of fluids but you should avoid alcoholic beverages for 24 hours.  ACTIVITY:  You should plan to take it easy for the rest of today and you should NOT DRIVE or use heavy machinery until tomorrow (because of the sedation medicines used during the test).    FOLLOW UP: Our staff will call the number listed on your records the next business day following your procedure.  We will call around 7:15- 8:00 am to check on you and address any questions or concerns that you may have regarding the information given to you following your procedure. If we do not reach you, we will leave a message.     If any biopsies were taken you will be contacted by phone or by letter within the next 1-3 weeks.  Please call us at [phone] if you have not heard about the biopsies in 3 weeks.    SIGNATURES/CONFIDENTIALITY: You and/or your care partner have signed paperwork which will be entered into your electronic medical record.  These signatures attest to the fact that that the information above on your After Visit Summary has been reviewed and is understood.  Full responsibility of the confidentiality of  this discharge information lies with you and/or your care-partner.

## 2022-10-14 NOTE — Op Note (Signed)
[facility] Patient Name: Kimberly Escobar Procedure Date: 10/14/2022 9:41 AM MRN: [id number] Endoscopist: Corliss Parish , MD, [id number] Age: 67 Referring MD:  Date of Birth: [birth year] Gender: Female Account #: [id number] Procedure:                Upper GI endoscopy Indications:              Dysphagia, Heartburn, Gastro-esophageal reflux                            disease, Abnormal cine-esophagram, Exclusion of                            hiatal hernia Medicines:                Monitored Anesthesia Care Procedure:                Pre-Anesthesia Assessment:                           - Prior to the procedure, a History and Physical                            was performed, and patient medications and                            allergies were reviewed. The patient's tolerance of                            previous anesthesia was also reviewed. The risks                            and benefits of the procedure and the sedation                            options and risks were discussed with the patient.                            All questions were answered, and informed consent                            was obtained. Prior Anticoagulants: The patient has                            taken no anticoagulant or antiplatelet agents                            except for aspirin. ASA Grade Assessment: II - A                            patient with mild systemic disease. After reviewing                            the risks and benefits, the patient was deemed in  satisfactory condition to undergo the procedure.                           After obtaining informed consent, the endoscope was                            passed under direct vision. Throughout the                            procedure, the patient's blood pressure, pulse, and                            oxygen saturations were monitored continuously. The                            Olympus scope  [id number] was introduced through the                            mouth, and advanced to the second part of duodenum.                            The upper GI endoscopy was accomplished without                            difficulty. The patient tolerated the procedure. Scope In: Scope Out: Findings:                 No gross lesions were noted in the entire                            esophagus. Biopsies were taken with a cold forceps                            for histology to rule out EOE/LOE. After the rest                            of the procedure was completed, a guidewire was                            placed and the scope was withdrawn. Dilation was                            performed with a Savary dilator with mild                            resistance at 18 mm. The dilation site was examined                            following endoscope reinsertion and showed just                            below the UES a moderate mucosal disruption,  moderate improvement in luminal narrowing and no                            perforation.                           A non-obstructing Schatzki ring was found at the                            gastroesophageal junction. Disruption biopsies were                            performed.                           The Z-line was regular and was found 36 cm from the                            incisors.                           A 2 cm hiatal hernia was present.                           One non-bleeding cratered gastric ulcer with a                            clean ulcer base (Forrest Class III) was found in                            the prepyloric region of the stomach. The lesion                            was 10 mm in largest dimension.                           Patchy mild inflammation characterized by erosions                            and erythema was found in the entire examined                            stomach. Biopsies  were taken with a cold forceps                            for histology and Helicobacter pylori testing.                           No gross lesions were noted in the duodenal bulb,                            in the first portion of the duodenum and in the                            second  portion of the duodenum. Complications:            No immediate complications. Estimated Blood Loss:     Estimated blood loss was minimal. Impression:               - No gross lesions in the entire esophagus.                            Biopsied. Dilated to 18 mm savory with mucosal                            wrent below the UES.                           - Non-obstructing Schatzki ring. Disrupted.                           - Z-line regular, 36 cm from the incisors.                           - 2 cm hiatal hernia.                           - Non-bleeding gastric ulcer with a clean ulcer                            base found in the prepylorus (Forrest Class III).                           - Gastritis. Biopsied.                           - No gross lesions in the duodenal bulb, in the                            first portion of the duodenum and in the second                            portion of the duodenum. Recommendation:           - Proceed to scheduled colonoscopy.                           - Increased to pantoprazole 40 mg twice daily.                           - Carafate 1 g liquid or slurry twice daily for 1                            month.                           - Observe patient's clinical course.                           - Await pathology results.                           -  Repeat upper endoscopy in 4 months to check                            healing of the gastric ulcer and PUD.                           - Depending on patient's symptomatology in regards                            to dysphagia, repeat dilation could be considered.                           - It is not clear to me at this  point that her                            hiatal hernia necessarily needs repair. It would be                            ideal to see how the patient feels with being on                            increased PPI therapy in regards to her                            reflux/regurgitation symptoms, but if improving                            then certainly can consider fundoplication and                            hiatal hernia treatment in future.                           - The findings and recommendations were discussed                            with the patient.                           - The findings and recommendations were discussed                            with the patient's family. Corliss Parish, MD 10/14/2022 10:21:39 AM

## 2022-10-14 NOTE — Progress Notes (Unsigned)
Patient pre-treated with Zofran and Robinul IV.  Patient required jaw thrust during EGD. Oropharynx suctioned with Kimberly Escobar, clear secretions noted; nothing gastric. Uneventful anesthetic. Report to pacu rn. Vss. Care resumed by rn.

## 2022-10-14 NOTE — Progress Notes (Unsigned)
Vitals-SH  Pt's states no medical or surgical changes since previsit or office visit. 
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
## 2022-10-14 NOTE — Op Note (Signed)
[facility] Patient Name: Kimberly Escobar Procedure Date: 10/14/2022 9:40 AM MRN: [id number] Endoscopist: Corliss Parish , MD, [id number] Age: 67 Referring MD:  Date of Birth: Jun 18, 1955 Gender: Female Account #: [id number] Procedure:                Colonoscopy Indications:              Screening for colorectal malignant neoplasm Medicines:                Monitored Anesthesia Care Procedure:                Pre-Anesthesia Assessment:                           - Prior to the procedure, a History and Physical                            was performed, and patient medications and                            allergies were reviewed. The patient's tolerance of                            previous anesthesia was also reviewed. The risks                            and benefits of the procedure and the sedation                            options and risks were discussed with the patient.                            All questions were answered, and informed consent                            was obtained. Prior Anticoagulants: The patient has                            taken no anticoagulant or antiplatelet agents                            except for aspirin. ASA Grade Assessment: II - A                            patient with mild systemic disease. After reviewing                            the risks and benefits, the patient was deemed in                            satisfactory condition to undergo the procedure.                           After obtaining informed consent, the colonoscope  was passed under direct vision. Throughout the                            procedure, the patient's blood pressure, pulse, and                            oxygen saturations were monitored continuously. The                            Olympus Scope SN: [id number] was introduced through                            the anus and advanced to the 3 cm into the ileum.                             The colonoscopy was performed without difficulty.                            The patient tolerated the procedure. The quality of                            the bowel preparation was adequate. The terminal                            ileum, ileocecal valve, appendiceal orifice, and                            rectum were photographed. Scope In: 9:59:06 AM Scope Out: 10:12:43 AM Scope Withdrawal Time: 0 hours 10 minutes 24 seconds  Total Procedure Duration: 0 hours 13 minutes 37 seconds  Findings:                 The digital rectal exam findings include                            hemorrhoids. Pertinent negatives include no                            palpable rectal lesions.                           The terminal ileum and ileocecal valve appeared                            normal.                           Five sessile polyps were found in the sigmoid colon                            (1), descending colon (1) and hepatic flexure (3).                            The polyps were 2 to 4 mm in size. These polyps  were removed with a cold snare. Resection and                            retrieval were complete.                           Normal mucosa was found in the entire colon                            otherwise.                           Non-bleeding non-thrombosed internal hemorrhoids                            were found during retroflexion, during perianal                            exam and during digital exam. The hemorrhoids were                            Grade II (internal hemorrhoids that prolapse but                            reduce spontaneously). Complications:            No immediate complications. Estimated Blood Loss:     Estimated blood loss was minimal. Impression:               - Hemorrhoids found on digital rectal exam.                           - The examined portion of the ileum was normal.                           - Five 2 to 4 mm  polyps in the sigmoid colon, in                            the descending colon and at the hepatic flexure,                            removed with a cold snare. Resected and retrieved.                           - Normal mucosa in the entire examined colon                            otherwise.                           - Non-bleeding non-thrombosed internal hemorrhoids. Recommendation:           - The patient will be observed post-procedure,                            until all discharge criteria are met.                           -  Discharge patient to home.                           - Patient has a contact number available for                            emergencies. The signs and symptoms of potential                            delayed complications were discussed with the                            patient. Return to normal activities tomorrow.                            Written discharge instructions were provided to the                            patient.                           - High fiber diet.                           - Use FiberCon 1-2 tablets PO daily.                           - Continue present medications.                           - Await pathology results.                           - Repeat colonoscopy in 3/05/19/08 years for                            surveillance based on pathology results.                           - The findings and recommendations were discussed                            with the patient.                           - The findings and recommendations were discussed                            with the patient's family. Corliss Parish, MD 10/14/2022 10:24:22 AM

## 2022-10-15 ENCOUNTER — Telehealth: Payer: Self-pay | Admitting: *Deleted

## 2022-10-15 ENCOUNTER — Other Ambulatory Visit: Payer: Self-pay | Admitting: Family Medicine

## 2022-10-15 DIAGNOSIS — R600 Localized edema: Secondary | ICD-10-CM

## 2022-10-15 NOTE — Telephone Encounter (Signed)
Attempted f/u phone call. No answer. Left message. °

## 2022-10-16 ENCOUNTER — Ambulatory Visit: Payer: HMO | Admitting: Rheumatology

## 2022-10-16 LAB — SURGICAL PATHOLOGY

## 2022-10-17 ENCOUNTER — Encounter: Payer: Self-pay | Admitting: Gastroenterology

## 2022-10-21 ENCOUNTER — Other Ambulatory Visit (HOSPITAL_BASED_OUTPATIENT_CLINIC_OR_DEPARTMENT_OTHER): Payer: Self-pay | Admitting: Family Medicine

## 2022-10-21 DIAGNOSIS — Z1231 Encounter for screening mammogram for malignant neoplasm of breast: Secondary | ICD-10-CM

## 2022-10-23 ENCOUNTER — Ambulatory Visit: Payer: HMO | Admitting: Rheumatology

## 2022-10-27 ENCOUNTER — Encounter (HOSPITAL_BASED_OUTPATIENT_CLINIC_OR_DEPARTMENT_OTHER): Payer: Self-pay

## 2022-10-27 ENCOUNTER — Ambulatory Visit (HOSPITAL_BASED_OUTPATIENT_CLINIC_OR_DEPARTMENT_OTHER)
Admission: RE | Admit: 2022-10-27 | Discharge: 2022-10-27 | Disposition: A | Discharge status: Home / Self Care | Payer: HMO | Source: Ambulatory Visit | Attending: Family Medicine | Admitting: Family Medicine

## 2022-10-27 DIAGNOSIS — Z1231 Encounter for screening mammogram for malignant neoplasm of breast: Secondary | ICD-10-CM | POA: Insufficient documentation

## 2022-11-18 ENCOUNTER — Encounter: Payer: Self-pay | Admitting: Rheumatology

## 2022-11-18 ENCOUNTER — Ambulatory Visit: Payer: HMO | Attending: Rheumatology | Admitting: Rheumatology

## 2022-11-18 VITALS — BP 124/83 | HR 84 | Resp 14 | Ht 65.0 in | Wt 181.4 lb

## 2022-11-18 DIAGNOSIS — M8589 Other specified disorders of bone density and structure, multiple sites: Secondary | ICD-10-CM

## 2022-11-18 DIAGNOSIS — G4709 Other insomnia: Secondary | ICD-10-CM | POA: Diagnosis not present

## 2022-11-18 DIAGNOSIS — M19042 Primary osteoarthritis, left hand: Secondary | ICD-10-CM

## 2022-11-18 DIAGNOSIS — Z9889 Other specified postprocedural states: Secondary | ICD-10-CM | POA: Diagnosis not present

## 2022-11-18 DIAGNOSIS — E785 Hyperlipidemia, unspecified: Secondary | ICD-10-CM

## 2022-11-18 DIAGNOSIS — M351 Other overlap syndromes: Secondary | ICD-10-CM

## 2022-11-18 DIAGNOSIS — G8929 Other chronic pain: Secondary | ICD-10-CM

## 2022-11-18 DIAGNOSIS — M533 Sacrococcygeal disorders, not elsewhere classified: Secondary | ICD-10-CM | POA: Diagnosis not present

## 2022-11-18 DIAGNOSIS — M359 Systemic involvement of connective tissue, unspecified: Secondary | ICD-10-CM | POA: Diagnosis not present

## 2022-11-18 DIAGNOSIS — Z8719 Personal history of other diseases of the digestive system: Secondary | ICD-10-CM | POA: Diagnosis not present

## 2022-11-18 DIAGNOSIS — M170 Bilateral primary osteoarthritis of knee: Secondary | ICD-10-CM | POA: Diagnosis not present

## 2022-11-18 DIAGNOSIS — M797 Fibromyalgia: Secondary | ICD-10-CM | POA: Diagnosis not present

## 2022-11-18 DIAGNOSIS — Z8669 Personal history of other diseases of the nervous system and sense organs: Secondary | ICD-10-CM

## 2022-11-18 DIAGNOSIS — M7062 Trochanteric bursitis, left hip: Secondary | ICD-10-CM

## 2022-11-18 DIAGNOSIS — M19041 Primary osteoarthritis, right hand: Secondary | ICD-10-CM | POA: Diagnosis not present

## 2022-11-18 DIAGNOSIS — M5030 Other cervical disc degeneration, unspecified cervical region: Secondary | ICD-10-CM | POA: Diagnosis not present

## 2022-11-18 DIAGNOSIS — Z79899 Other long term (current) drug therapy: Secondary | ICD-10-CM | POA: Diagnosis not present

## 2022-11-18 MED ORDER — HYDROXYCHLOROQUINE SULFATE 200 MG PO TABS: ORAL_TABLET | ORAL | 0 refills | Status: DC

## 2022-11-18 NOTE — Patient Instructions (Signed)

## 2022-11-19 ENCOUNTER — Other Ambulatory Visit (HOSPITAL_BASED_OUTPATIENT_CLINIC_OR_DEPARTMENT_OTHER): Payer: Self-pay

## 2022-11-19 MED ORDER — COVID-19 MRNA VAC-TRIS(PFIZER) 30 MCG/0.3ML IM SUSY
0.3000 mL | PREFILLED_SYRINGE | Freq: Once | INTRAMUSCULAR | 0 refills | Status: AC
  Filled 2022-11-19: qty 0.3, 1d supply, fill #0

## 2022-11-20 NOTE — Progress Notes (Signed)
Urine protein creatinine ratio normal, CMP normal, Smith positive, RNP positive, CBC normal, dsDNA negative, complements normal, sedimentation rate normal.  Labs are stable.  No change in treatment advised.

## 2022-11-21 ENCOUNTER — Ambulatory Visit (INDEPENDENT_AMBULATORY_CARE_PROVIDER_SITE_OTHER): Payer: HMO | Admitting: Family Medicine

## 2022-11-21 ENCOUNTER — Encounter: Payer: Self-pay | Admitting: Family Medicine

## 2022-11-21 VITALS — BP 110/70 | HR 76 | Temp 98.5°F | Resp 18 | Ht 65.0 in | Wt 177.4 lb

## 2022-11-21 DIAGNOSIS — R1033 Periumbilical pain: Secondary | ICD-10-CM | POA: Diagnosis not present

## 2022-11-21 DIAGNOSIS — R82998 Other abnormal findings in urine: Secondary | ICD-10-CM | POA: Diagnosis not present

## 2022-11-21 LAB — COMPREHENSIVE METABOLIC PANEL
ALT: 42 U/L — ABNORMAL HIGH (ref 0–35)
AST: 23 U/L (ref 0–37)
Albumin: 4.3 g/dL (ref 3.5–5.2)
Alkaline Phosphatase: 85 U/L (ref 39–117)
BUN: 20 mg/dL (ref 6–23)
CO2: 27 meq/L (ref 19–32)
Calcium: 9.4 mg/dL (ref 8.4–10.5)
Chloride: 107 meq/L (ref 96–112)
Creatinine, Ser: 0.78 mg/dL (ref 0.40–1.20)
GFR: 77.63 mL/min (ref >60.00)
Glucose, Bld: 89 mg/dL (ref 70–99)
Potassium: 3.7 meq/L (ref 3.5–5.1)
Sodium: 141 meq/L (ref 135–145)
Total Bilirubin: 0.4 mg/dL (ref 0.2–1.2)
Total Protein: 7.2 g/dL (ref 6.0–8.3)

## 2022-11-21 LAB — CBC WITH DIFFERENTIAL/PLATELET
Basophils Absolute: 0 10*3/uL (ref 0.0–0.1)
Basophils Relative: 0.8 % (ref 0.0–3.0)
Eosinophils Absolute: 0.1 10*3/uL (ref 0.0–0.7)
Eosinophils Relative: 1.8 % (ref 0.0–5.0)
HCT: 38.2 % (ref 36.0–46.0)
Hemoglobin: 12.7 g/dL (ref 12.0–15.0)
Lymphocytes Relative: 37.5 % (ref 12.0–46.0)
Lymphs Abs: 1.8 10*3/uL (ref 0.7–4.0)
MCHC: 33.3 g/dL (ref 30.0–36.0)
MCV: 97 fL (ref 78.0–100.0)
Monocytes Absolute: 0.5 10*3/uL (ref 0.1–1.0)
Monocytes Relative: 11 % (ref 3.0–12.0)
Neutro Abs: 2.4 10*3/uL (ref 1.4–7.7)
Neutrophils Relative %: 48.9 % (ref 43.0–77.0)
Platelets: 261 10*3/uL (ref 150.0–400.0)
RBC: 3.94 Mil/uL (ref 3.87–5.11)
RDW: 13.3 % (ref 11.5–15.5)
WBC: 4.9 10*3/uL (ref 4.0–10.5)

## 2022-11-21 LAB — POC URINALSYSI DIPSTICK (AUTOMATED)
Blood, UA: NEGATIVE
Glucose, UA: NEGATIVE
Ketones, UA: NEGATIVE
Nitrite, UA: NEGATIVE
Protein, UA: NEGATIVE
Spec Grav, UA: 1.015 (ref 1.010–1.025)
Urobilinogen, UA: 0.2 U/dL
pH, UA: 6 (ref 5.0–8.0)

## 2022-11-21 MED ORDER — DICLOFENAC SODIUM 75 MG PO TBEC: DELAYED_RELEASE_TABLET | ORAL | 0 refills | Status: DC

## 2022-11-21 NOTE — Patient Instructions (Signed)
Abdominal Pain, Adult  Pain in the abdomen (abdominal pain) can be caused by many things. In most cases, it gets better with no treatment or by being treated at home. But in some cases, it can be serious. Your health care provider will ask questions about your medical history and do a physical exam to try to figure out what is causing your pain. Follow these instructions at home: Medicines Take over-the-counter and prescription medicines only as told by your provider. Do not take medicines that help you poop (laxatives) unless told by your provider. General instructions Watch your condition for any changes. Drink enough fluid to keep your pee (urine) pale yellow. Contact a health care provider if: Your pain changes, gets worse, or lasts longer than expected. You have severe cramping or bloating in your abdomen, or you vomit. Your pain gets worse with meals, after eating, or with certain foods. You are constipated or have diarrhea for more than 2-3 days. You are not hungry, or you lose weight without trying. You have signs of dehydration. These may include: Dark pee, very little pee, or no pee. Cracked lips or dry mouth. Sleepiness or weakness. You have pain when you pee (urinate) or poop. Your abdominal pain wakes you up at night. You have blood in your pee. You have a fever. Get help right away if: You cannot stop vomiting. Your pain is only in one part of the abdomen. Pain on the right side could be caused by appendicitis. You have bloody or black poop (stool), or poop that looks like tar. You have trouble breathing. You have chest pain. These symptoms may be an emergency. Get help right away. Call 911. Do not wait to see if the symptoms will go away. Do not drive yourself to the hospital. This information is not intended to replace advice given to you by your health care provider. Make sure you discuss any questions you have with your health care provider. Document Revised:  10/16/2021 Document Reviewed: 10/16/2021 Elsevier Patient Education  2024 Elsevier Inc.  
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
## 2022-11-22 ENCOUNTER — Ambulatory Visit (HOSPITAL_BASED_OUTPATIENT_CLINIC_OR_DEPARTMENT_OTHER)
Admission: RE | Admit: 2022-11-22 | Discharge: 2022-11-22 | Disposition: A | Discharge status: Home / Self Care | Payer: HMO | Source: Ambulatory Visit | Attending: Family Medicine | Admitting: Family Medicine

## 2022-11-22 ENCOUNTER — Encounter (HOSPITAL_BASED_OUTPATIENT_CLINIC_OR_DEPARTMENT_OTHER): Payer: Self-pay

## 2022-11-22 DIAGNOSIS — R1033 Periumbilical pain: Secondary | ICD-10-CM | POA: Diagnosis not present

## 2022-11-22 DIAGNOSIS — K7689 Other specified diseases of liver: Secondary | ICD-10-CM | POA: Diagnosis not present

## 2022-11-22 LAB — CBC WITH DIFFERENTIAL/PLATELET
Absolute Lymphocytes: 2981 {cells}/uL (ref 850–3900)
Absolute Monocytes: 554 {cells}/uL (ref 200–950)
Basophils Absolute: 58 {cells}/uL (ref 0–200)
Basophils Relative: 0.8 %
Eosinophils Absolute: 122 {cells}/uL (ref 15–500)
Eosinophils Relative: 1.7 %
HCT: 38.3 % (ref 35.0–45.0)
Hemoglobin: 12.8 g/dL (ref 11.7–15.5)
MCH: 31.9 pg (ref 27.0–33.0)
MCHC: 33.4 g/dL (ref 32.0–36.0)
MCV: 95.5 fL (ref 80.0–100.0)
MPV: 9.9 fL (ref 7.5–12.5)
Monocytes Relative: 7.7 %
Neutro Abs: 3485 {cells}/uL (ref 1500–7800)
Neutrophils Relative %: 48.4 %
Platelets: 284 10*3/uL (ref 140–400)
RBC: 4.01 10*6/uL (ref 3.80–5.10)
RDW: 12.5 % (ref 11.0–15.0)
Total Lymphocyte: 41.4 %
WBC: 7.2 10*3/uL (ref 3.8–10.8)

## 2022-11-22 LAB — COMPLETE METABOLIC PANEL WITH GFR
AG Ratio: 1.4 (calc) (ref 1.0–2.5)
ALT: 26 U/L (ref 6–29)
AST: 16 U/L (ref 10–35)
Albumin: 4.2 g/dL (ref 3.6–5.1)
Alkaline phosphatase (APISO): 89 U/L (ref 37–153)
BUN: 25 mg/dL (ref 7–25)
CO2: 26 mmol/L (ref 20–32)
Calcium: 9.8 mg/dL (ref 8.6–10.4)
Chloride: 107 mmol/L (ref 98–110)
Creat: 0.74 mg/dL (ref 0.50–1.05)
Globulin: 3.1 g/dL (ref 1.9–3.7)
Glucose, Bld: 100 mg/dL — ABNORMAL HIGH (ref 65–99)
Potassium: 3.7 mmol/L (ref 3.5–5.3)
Sodium: 140 mmol/L (ref 135–146)
Total Bilirubin: 0.3 mg/dL (ref 0.2–1.2)
Total Protein: 7.3 g/dL (ref 6.1–8.1)
eGFR: 88 mL/min/{1.73_m2} (ref >=60)

## 2022-11-22 LAB — ANTI-DNA ANTIBODY, DOUBLE-STRANDED: ds DNA Ab: <1 [IU]/mL

## 2022-11-22 LAB — URINE CULTURE
MICRO NUMBER:: 15705826
Result:: NO GROWTH
SPECIMEN QUALITY:: ADEQUATE

## 2022-11-22 LAB — C3 AND C4
C3 Complement: 144 mg/dL (ref 83–193)
C4 Complement: 20 mg/dL (ref 15–57)

## 2022-11-22 LAB — ANTI-NUCLEAR AB-TITER (ANA TITER): ANA Titer 1: 1:40 {titer} — ABNORMAL HIGH

## 2022-11-22 LAB — PROTEIN / CREATININE RATIO, URINE
Creatinine, Urine: 60 mg/dL (ref 20–275)
Total Protein, Urine: <4 mg/dL — ABNORMAL LOW (ref 5–24)

## 2022-11-22 LAB — ANA: Anti Nuclear Antibody (ANA): POSITIVE — AB

## 2022-11-22 LAB — RNP ANTIBODY: Ribonucleic Protein(ENA) Antibody, IgG: 1.1 AI — AB

## 2022-11-22 LAB — SEDIMENTATION RATE: Sed Rate: 11 mm/h (ref 0–30)

## 2022-11-22 LAB — ANTI-SMITH ANTIBODY: ENA SM Ab Ser-aCnc: 3.5 AI — AB

## 2022-11-22 MED ORDER — IOHEXOL 300 MG/ML  SOLN
80.0000 mL | Freq: Once | INTRAMUSCULAR | Status: AC | PRN
  Administered 2022-11-22: 80 mL via INTRAVENOUS

## 2022-12-08 ENCOUNTER — Other Ambulatory Visit (HOSPITAL_BASED_OUTPATIENT_CLINIC_OR_DEPARTMENT_OTHER): Payer: Self-pay

## 2022-12-08 ENCOUNTER — Ambulatory Visit (INDEPENDENT_AMBULATORY_CARE_PROVIDER_SITE_OTHER): Payer: HMO | Admitting: *Deleted

## 2022-12-08 VITALS — BP 125/76 | HR 87 | Ht 65.0 in | Wt 177.6 lb

## 2022-12-08 DIAGNOSIS — R1033 Periumbilical pain: Secondary | ICD-10-CM

## 2022-12-08 DIAGNOSIS — H6991 Unspecified Eustachian tube disorder, right ear: Secondary | ICD-10-CM | POA: Diagnosis not present

## 2022-12-08 DIAGNOSIS — Z23 Encounter for immunization: Secondary | ICD-10-CM

## 2022-12-08 DIAGNOSIS — Z0000 Encounter for general adult medical examination without abnormal findings: Secondary | ICD-10-CM

## 2022-12-08 DIAGNOSIS — Z8619 Personal history of other infectious and parasitic diseases: Secondary | ICD-10-CM | POA: Diagnosis not present

## 2022-12-08 MED ORDER — DICLOFENAC SODIUM 75 MG PO TBEC: DELAYED_RELEASE_TABLET | ORAL | 0 refills | Status: DC

## 2022-12-08 MED ORDER — FLUTICASONE PROPIONATE 50 MCG/ACT NA SUSP: NASAL | 11 refills | Status: AC

## 2022-12-08 MED ORDER — BOOSTRIX 5-2.5-18.5 LF-MCG/0.5 IM SUSY
0.5000 mL | PREFILLED_SYRINGE | Freq: Once | INTRAMUSCULAR | 0 refills | Status: AC
  Filled 2022-12-08: qty 0.5, 1d supply, fill #0

## 2022-12-08 MED ORDER — ACYCLOVIR 5 % EX OINT: TOPICAL_OINTMENT | CUTANEOUS | 5 refills | Status: AC

## 2022-12-08 MED ORDER — ACYCLOVIR 400 MG PO TABS: 400.0000 mg | ORAL_TABLET | Freq: Every day | ORAL | 5 refills | Status: AC | PRN

## 2022-12-08 MED ORDER — LORATADINE 10 MG PO TABS: 10.0000 mg | ORAL_TABLET | Freq: Every day | ORAL | 11 refills | Status: AC

## 2022-12-08 NOTE — Patient Instructions (Signed)
[name] , Thank you for taking time to come for your Medicare Wellness Visit. I appreciate your ongoing commitment to your health goals. Please review the following plan we discussed and let me know if I can assist you in the future.     This is a list of the screening recommended for you and due dates:  Health Maintenance  Topic Date Due   COVID-19 Vaccine (6 - 2023-24 season) 01/14/2023   Mammogram  10/27/2023   Medicare Annual Wellness Visit  12/08/2023   Cologuard (Stool DNA test)  06/17/2024   DTaP/Tdap/Td vaccine (3 - Td or Tdap) 12/07/2032   Pneumonia Vaccine  Completed   Flu Shot  Completed   DEXA scan (bone density measurement)  Completed   Hepatitis C Screening  Completed   Zoster (Shingles) Vaccine  Completed   HPV Vaccine  Aged Out   Colon Cancer Screening  Discontinued    Next appointment: Follow up in one year for your annual wellness visit.   Preventive Care 51 Years and Older, Female Preventive care refers to lifestyle choices and visits with your health care provider that can promote health and wellness. What does preventive care include? A yearly physical exam. This is also called an annual well check. Dental exams once or twice a year. Routine eye exams. Ask your health care provider how often you should have your eyes checked. Personal lifestyle choices, including: Daily care of your teeth and gums. Regular physical activity. Eating a healthy diet. Avoiding tobacco and drug use. Limiting alcohol use. Practicing safe sex. Taking low-dose aspirin every day. Taking vitamin and mineral supplements as recommended by your health care provider. What happens during an annual well check? The services and screenings done by your health care provider during your annual well check will depend on your age, overall health, lifestyle risk factors, and family history of disease. Counseling  Your health care provider may ask you questions about your: Alcohol  use. Tobacco use. Drug use. Emotional well-being. Home and relationship well-being. Sexual activity. Eating habits. History of falls. Memory and ability to understand (cognition). Work and work Astronomer. Reproductive health. Screening  You may have the following tests or measurements: Height, weight, and BMI. Blood pressure. Lipid and cholesterol levels. These may be checked every 5 years, or more frequently if you are over 50 years old. Skin check. Lung cancer screening. You may have this screening every year starting at age 52 if you have a 30-pack-year history of smoking and currently smoke or have quit within the past 15 years. Fecal occult blood test (FOBT) of the stool. You may have this test every year starting at age 54. Flexible sigmoidoscopy or colonoscopy. You may have a sigmoidoscopy every 5 years or a colonoscopy every 10 years starting at age 8. Hepatitis C blood test. Hepatitis B blood test. Sexually transmitted disease (STD) testing. Diabetes screening. This is done by checking your blood sugar (glucose) after you have not eaten for a while (fasting). You may have this done every 1-3 years. Bone density scan. This is done to screen for osteoporosis. You may have this done starting at age 31. Mammogram. This may be done every 1-2 years. Talk to your health care provider about how often you should have regular mammograms. Talk with your health care provider about your test results, treatment options, and if necessary, the need for more tests. Vaccines  Your health care provider may recommend certain vaccines, such as: Influenza vaccine. This is recommended every year.  Tetanus, diphtheria, and acellular pertussis (Tdap, Td) vaccine. You may need a Td booster every 10 years. Zoster vaccine. You may need this after age 46. Pneumococcal 13-valent conjugate (PCV13) vaccine. One dose is recommended after age 49. Pneumococcal polysaccharide (PPSV23) vaccine. One dose is  recommended after age 47. Talk to your health care provider about which screenings and vaccines you need and how often you need them. This information is not intended to replace advice given to you by your health care provider. Make sure you discuss any questions you have with your health care provider. Document Released: 01/26/2015 Document Revised: 09/19/2015 Document Reviewed: 10/31/2014 Elsevier Interactive Patient Education  2017 [employer].  Fall Prevention in the Home Falls can cause injuries. They can happen to people of all ages. There are many things you can do to make your home safe and to help prevent falls. What can I do on the outside of my home? Regularly fix the edges of walkways and driveways and fix any cracks. Remove anything that might make you trip as you walk through a door, such as a raised step or threshold. Trim any bushes or trees on the path to your home. Use bright outdoor lighting. Clear any walking paths of anything that might make someone trip, such as rocks or tools. Regularly check to see if handrails are loose or broken. Make sure that both sides of any steps have handrails. Any raised decks and porches should have guardrails on the edges. Have any leaves, snow, or ice cleared regularly. Use sand or salt on walking paths during winter. Clean up any spills in your garage right away. This includes oil or grease spills. What can I do in the bathroom? Use night lights. Install grab bars by the toilet and in the tub and shower. Do not use towel bars as grab bars. Use non-skid mats or decals in the tub or shower. If you need to sit down in the shower, use a plastic, non-slip stool. Keep the floor dry. Clean up any water that spills on the floor as soon as it happens. Remove soap buildup in the tub or shower regularly. Attach bath mats securely with double-sided non-slip rug tape. Do not have throw rugs and other things on the floor that can make you  trip. What can I do in the bedroom? Use night lights. Make sure that you have a light by your bed that is easy to reach. Do not use any sheets or blankets that are too big for your bed. They should not hang down onto the floor. Have a firm chair that has side arms. You can use this for support while you get dressed. Do not have throw rugs and other things on the floor that can make you trip. What can I do in the kitchen? Clean up any spills right away. Avoid walking on wet floors. Keep items that you use a lot in easy-to-reach places. If you need to reach something above you, use a strong step stool that has a grab bar. Keep electrical cords out of the way. Do not use floor polish or wax that makes floors slippery. If you must use wax, use non-skid floor wax. Do not have throw rugs and other things on the floor that can make you trip. What can I do with my stairs? Do not leave any items on the stairs. Make sure that there are handrails on both sides of the stairs and use them. Fix handrails that are broken or loose.  Make sure that handrails are as long as the stairways. Check any carpeting to make sure that it is firmly attached to the stairs. Fix any carpet that is loose or worn. Avoid having throw rugs at the top or bottom of the stairs. If you do have throw rugs, attach them to the floor with carpet tape. Make sure that you have a light switch at the top of the stairs and the bottom of the stairs. If you do not have them, ask someone to add them for you. What else can I do to help prevent falls? Wear shoes that: Do not have high heels. Have rubber bottoms. Are comfortable and fit you well. Are closed at the toe. Do not wear sandals. If you use a stepladder: Make sure that it is fully opened. Do not climb a closed stepladder. Make sure that both sides of the stepladder are locked into place. Ask someone to hold it for you, if possible. Clearly mark and make sure that you can  see: Any grab bars or handrails. First and last steps. Where the edge of each step is. Use tools that help you move around (mobility aids) if they are needed. These include: Canes. Walkers. Scooters. Crutches. Turn on the lights when you go into a dark area. Replace any light bulbs as soon as they burn out. Set up your furniture so you have a clear path. Avoid moving your furniture around. If any of your floors are uneven, fix them. If there are any pets around you, be aware of where they are. Review your medicines with your doctor. Some medicines can make you feel dizzy. This can increase your chance of falling. Ask your doctor what other things that you can do to help prevent falls. This information is not intended to replace advice given to you by your health care provider. Make sure you discuss any questions you have with your health care provider. Document Released: 10/26/2008 Document Revised: 06/07/2015 Document Reviewed: 02/03/2014 Elsevier Interactive Patient Education  2017 [employer].
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
## 2022-12-10 ENCOUNTER — Other Ambulatory Visit (HOSPITAL_BASED_OUTPATIENT_CLINIC_OR_DEPARTMENT_OTHER): Payer: Self-pay

## 2022-12-15 ENCOUNTER — Encounter: Payer: HMO | Admitting: Family Medicine

## 2022-12-19 ENCOUNTER — Ambulatory Visit (HOSPITAL_BASED_OUTPATIENT_CLINIC_OR_DEPARTMENT_OTHER)
Admission: RE | Admit: 2022-12-19 | Discharge: 2022-12-19 | Disposition: A | Discharge status: Home / Self Care | Payer: HMO | Source: Ambulatory Visit | Attending: Family Medicine | Admitting: Family Medicine

## 2022-12-19 ENCOUNTER — Ambulatory Visit (INDEPENDENT_AMBULATORY_CARE_PROVIDER_SITE_OTHER): Payer: HMO | Admitting: Family Medicine

## 2022-12-19 ENCOUNTER — Encounter: Payer: Self-pay | Admitting: Family Medicine

## 2022-12-19 VITALS — BP 128/80 | HR 70 | Temp 97.6°F | Resp 18 | Ht 65.0 in | Wt 181.4 lb

## 2022-12-19 DIAGNOSIS — Z0000 Encounter for general adult medical examination without abnormal findings: Secondary | ICD-10-CM

## 2022-12-19 DIAGNOSIS — E041 Nontoxic single thyroid nodule: Secondary | ICD-10-CM | POA: Diagnosis not present

## 2022-12-19 DIAGNOSIS — E7800 Pure hypercholesterolemia, unspecified: Secondary | ICD-10-CM | POA: Diagnosis not present

## 2022-12-19 DIAGNOSIS — E042 Nontoxic multinodular goiter: Secondary | ICD-10-CM | POA: Diagnosis not present

## 2022-12-19 DIAGNOSIS — I10 Essential (primary) hypertension: Secondary | ICD-10-CM

## 2022-12-19 LAB — COMPREHENSIVE METABOLIC PANEL
ALT: 42 U/L — ABNORMAL HIGH (ref 0–35)
AST: 22 U/L (ref 0–37)
Albumin: 4.2 g/dL (ref 3.5–5.2)
Alkaline Phosphatase: 89 U/L (ref 39–117)
BUN: 19 mg/dL (ref 6–23)
CO2: 30 meq/L (ref 19–32)
Calcium: 9.5 mg/dL (ref 8.4–10.5)
Chloride: 104 meq/L (ref 96–112)
Creatinine, Ser: 0.73 mg/dL (ref 0.40–1.20)
GFR: 84 mL/min (ref >60.00)
Glucose, Bld: 87 mg/dL (ref 70–99)
Potassium: 4.3 meq/L (ref 3.5–5.1)
Sodium: 140 meq/L (ref 135–145)
Total Bilirubin: 0.4 mg/dL (ref 0.2–1.2)
Total Protein: 7.3 g/dL (ref 6.0–8.3)

## 2022-12-19 LAB — CBC WITH DIFFERENTIAL/PLATELET
Basophils Absolute: 0 10*3/uL (ref 0.0–0.1)
Basophils Relative: 0.9 % (ref 0.0–3.0)
Eosinophils Absolute: 0.1 10*3/uL (ref 0.0–0.7)
Eosinophils Relative: 1.8 % (ref 0.0–5.0)
HCT: 40.2 % (ref 36.0–46.0)
Hemoglobin: 13.6 g/dL (ref 12.0–15.0)
Lymphocytes Relative: 40.2 % (ref 12.0–46.0)
Lymphs Abs: 2.1 10*3/uL (ref 0.7–4.0)
MCHC: 33.9 g/dL (ref 30.0–36.0)
MCV: 96.7 fL (ref 78.0–100.0)
Monocytes Absolute: 0.4 10*3/uL (ref 0.1–1.0)
Monocytes Relative: 8.2 % (ref 3.0–12.0)
Neutro Abs: 2.5 10*3/uL (ref 1.4–7.7)
Neutrophils Relative %: 48.9 % (ref 43.0–77.0)
Platelets: 274 10*3/uL (ref 150.0–400.0)
RBC: 4.16 Mil/uL (ref 3.87–5.11)
RDW: 13.3 % (ref 11.5–15.5)
WBC: 5.2 10*3/uL (ref 4.0–10.5)

## 2022-12-19 LAB — LIPID PANEL
Cholesterol: 191 mg/dL (ref 0–200)
HDL: 54.8 mg/dL (ref >39.00)
LDL Cholesterol: 116 mg/dL — ABNORMAL HIGH (ref 0–99)
NonHDL: 136.36
Total CHOL/HDL Ratio: 3
Triglycerides: 103 mg/dL (ref 0.0–149.0)
VLDL: 20.6 mg/dL (ref 0.0–40.0)

## 2022-12-19 LAB — TSH: TSH: 0.9 u[IU]/mL (ref 0.35–5.50)
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
## 2022-12-19 NOTE — Assessment & Plan Note (Signed)
Encourage heart healthy diet such as MIND or DASH diet, increase exercise, avoid trans fats, simple carbohydrates and processed foods, consider a krill or fish or flaxseed oil cap daily.  °

## 2022-12-19 NOTE — Patient Instructions (Signed)
Preventive Care 65 Years and Older, Female Preventive care refers to lifestyle choices and visits with your health care provider that can promote health and wellness. Preventive care visits are also called wellness exams. What can I expect for my preventive care visit? Counseling Your health care provider may ask you questions about your: Medical history, including: Past medical problems. Family medical history. Pregnancy and menstrual history. History of falls. Current health, including: Memory and ability to understand (cognition). Emotional well-being. Home life and relationship well-being. Sexual activity and sexual health. Lifestyle, including: Alcohol, nicotine or tobacco, and drug use. Access to firearms. Diet, exercise, and sleep habits. Work and work environment. Sunscreen use. Safety issues such as seatbelt and bike helmet use. Physical exam Your health care provider will check your: Height and weight. These may be used to calculate your BMI (body mass index). BMI is a measurement that tells if you are at a healthy weight. Waist circumference. This measures the distance around your waistline. This measurement also tells if you are at a healthy weight and may help predict your risk of certain diseases, such as type 2 diabetes and high blood pressure. Heart rate and blood pressure. Body temperature. Skin for abnormal spots. What immunizations do I need?  Vaccines are usually given at various ages, according to a schedule. Your health care provider will recommend vaccines for you based on your age, medical history, and lifestyle or other factors, such as travel or where you work. What tests do I need? Screening Your health care provider may recommend screening tests for certain conditions. This may include: Lipid and cholesterol levels. Hepatitis C test. Hepatitis B test. HIV (human immunodeficiency virus) test. STI (sexually transmitted infection) testing, if you are at  risk. Lung cancer screening. Colorectal cancer screening. Diabetes screening. This is done by checking your blood sugar (glucose) after you have not eaten for a while (fasting). Mammogram. Talk with your health care provider about how often you should have regular mammograms. BRCA-related cancer screening. This may be done if you have a family history of breast, ovarian, tubal, or peritoneal cancers. Bone density scan. This is done to screen for osteoporosis. Talk with your health care provider about your test results, treatment options, and if necessary, the need for more tests. Follow these instructions at home: Eating and drinking  Eat a diet that includes fresh fruits and vegetables, whole grains, lean protein, and low-fat dairy products. Limit your intake of foods with high amounts of sugar, saturated fats, and salt. Take vitamin and mineral supplements as recommended by your health care provider. Do not drink alcohol if your health care provider tells you not to drink. If you drink alcohol: Limit how much you have to 0-1 drink a day. Know how much alcohol is in your drink. In the [place] one drink equals one 12 oz bottle of beer (355 mL), one 5 oz glass of wine (148 mL), or one 1 oz glass of hard liquor (44 mL). Lifestyle Brush your teeth every morning and night with fluoride toothpaste. Floss one time each day. Exercise for at least 30 minutes 5 or more days each week. Do not use any products that contain nicotine or tobacco. These products include cigarettes, chewing tobacco, and vaping devices, such as e-cigarettes. If you need help quitting, ask your health care provider. Do not use drugs. If you are sexually active, practice safe sex. Use a condom or other form of protection in order to prevent STIs. Take aspirin only as told by   your health care provider. Make sure that you understand how much to take and what form to take. Work with your health care provider to find out whether it  is safe and beneficial for you to take aspirin daily. Ask your health care provider if you need to take a cholesterol-lowering medicine (statin). Find healthy ways to manage stress, such as: Meditation, yoga, or listening to music. Journaling. Talking to a trusted person. Spending time with friends and family. Minimize exposure to UV radiation to reduce your risk of skin cancer. Safety Always wear your seat belt while driving or riding in a vehicle. Do not drive: If you have been drinking alcohol. Do not ride with someone who has been drinking. When you are tired or distracted. While texting. If you have been using any mind-altering substances or drugs. Wear a helmet and other protective equipment during sports activities. If you have firearms in your house, make sure you follow all gun safety procedures. What's next? Visit your health care provider once a year for an annual wellness visit. Ask your health care provider how often you should have your eyes and teeth checked. Stay up to date on all vaccines. This information is not intended to replace advice given to you by your health care provider. Make sure you discuss any questions you have with your health care provider. Document Revised: 06/27/2020 Document Reviewed: 06/27/2020 Elsevier Patient Education  2024 Elsevier Inc.  

## 2022-12-19 NOTE — Assessment & Plan Note (Signed)
Ghm utd Check labs  See AVS Health Maintenance  Topic Date Due   COVID-19 Vaccine (6 - 2023-24 season) 01/14/2023   MAMMOGRAM  10/27/2023   Medicare Annual Wellness (AWV)  12/08/2023   Fecal DNA (Cologuard)  06/17/2024   DTaP/Tdap/Td (3 - Td or Tdap) 12/07/2032   Pneumonia Vaccine 29+ Years old  Completed   INFLUENZA VACCINE  Completed   DEXA SCAN  Completed   Hepatitis C Screening  Completed   Zoster Vaccines- Shingrix  Completed   HPV VACCINES  Aged Out   Colonoscopy  Discontinued

## 2022-12-19 NOTE — Assessment & Plan Note (Signed)
Well controlled, no changes to meds. Encouraged heart healthy diet such as the DASH diet and exercise as tolerated.  °

## 2022-12-30 ENCOUNTER — Encounter: Payer: Self-pay | Admitting: Family Medicine

## 2023-02-11 ENCOUNTER — Other Ambulatory Visit: Payer: Self-pay | Admitting: Rheumatology

## 2023-02-11 DIAGNOSIS — M351 Other overlap syndromes: Secondary | ICD-10-CM

## 2023-02-11 NOTE — Telephone Encounter (Signed)
Last Fill: 11/18/2022  Eye exam: 08/26/2022   Labs: 12/19/2022 ALT 42  Next Visit: 04/30/2023  Last Visit: 11/18/2022  DX: MCTD (mixed connective tissue disease)   Current Dose per office note 11/18/2022: Plaquenil 200 mg 1 tablet by mouth twice daily Monday through Friday   Okay to refill Plaquenil?

## 2023-02-24 ENCOUNTER — Other Ambulatory Visit: Payer: Self-pay

## 2023-02-24 DIAGNOSIS — M62838 Other muscle spasm: Secondary | ICD-10-CM

## 2023-02-24 NOTE — Telephone Encounter (Signed)
Copied from CRM [id number]. Topic: Clinical - Medication Refill >> Feb 24, 2023  2:33 PM Aletta Edouard wrote: Most Recent Primary Care Visit:  Provider: Seabron Spates R  Department: [facility]  Visit Type: PHYSICAL  Date: 12/19/2022  Medication:   5mg  cyclovenzaprine   Has the patient contacted their pharmacy? No (Agent: If no, request that the patient contact the pharmacy for the refill. If patient does not wish to contact the pharmacy document the reason why and proceed with request.) (Agent: If yes, when and what did the pharmacy advise?)  Is this the correct pharmacy for this prescription? Yes If no, delete pharmacy and type the correct one.  This is the patient's preferred pharmacy:  [facility] [address] [place], [place] - [address] [place] [address] Phone: [phone] Fax: [phone]   Has the prescription been filled recently? No  Is the patient out of the medication? Yes  Has the patient been seen for an appointment in the last year OR does the patient have an upcoming appointment? Yes  Can we respond through MyChart? No  Agent: Please be advised that Rx refills may take up to 3 business days. We ask that you follow-up with your pharmacy.

## 2023-02-25 ENCOUNTER — Telehealth: Payer: Self-pay

## 2023-02-25 MED ORDER — CYCLOBENZAPRINE HCL 5 MG PO TABS: 5.0000 mg | ORAL_TABLET | Freq: Three times a day (TID) | ORAL | 1 refills | Status: DC | PRN

## 2023-02-25 NOTE — Telephone Encounter (Signed)
PA initiated via Covermymeds; KEY: [id number]. Awaiting determination.

## 2023-02-26 ENCOUNTER — Other Ambulatory Visit: Payer: Self-pay | Admitting: Family Medicine

## 2023-02-26 DIAGNOSIS — M62838 Other muscle spasm: Secondary | ICD-10-CM

## 2023-02-26 MED ORDER — TIZANIDINE HCL 4 MG PO TABS: 4.0000 mg | ORAL_TABLET | Freq: Four times a day (QID) | ORAL | 0 refills | Status: AC | PRN

## 2023-02-26 NOTE — Telephone Encounter (Signed)
PA was denied. Can we send something else in?

## 2023-02-26 NOTE — Telephone Encounter (Signed)
Copied from CRM [id number]. Topic: Clinical - Medication Refill >> Feb 26, 2023  9:45 AM Tiffany H wrote: Patient called to advise that insurance rejected ICD code. Please reach out to insurance company to confer about what ICD code they're looking for. Medication: Cyclobenzaprine. Patient is being asked to pay full price. Medication is $34. Please assist today, if possible.

## 2023-02-26 NOTE — Telephone Encounter (Signed)
PA denied.   The reason we denied your request is because the following prior authorization criteria were not met: 1) Prescriber acknowledges anticholinergic risks (e.g., confusion, dry mouth, blurry vision, constipation, urinary retention)   Appeal sent acknowledging anticholinergic risks. Awaiting determination.

## 2023-02-27 ENCOUNTER — Other Ambulatory Visit (HOSPITAL_COMMUNITY): Payer: Self-pay

## 2023-02-27 NOTE — Telephone Encounter (Signed)
Pt made aware

## 2023-03-03 ENCOUNTER — Encounter: Payer: Self-pay | Admitting: Gastroenterology

## 2023-03-09 ENCOUNTER — Other Ambulatory Visit: Payer: Self-pay | Admitting: Family Medicine

## 2023-03-09 ENCOUNTER — Telehealth: Payer: Self-pay

## 2023-03-09 NOTE — Telephone Encounter (Signed)
 Last Fill: Unknown  Last OV: 12/19/22 Next OV: None Scheduled  Routing to provider for review/authorization.

## 2023-03-09 NOTE — Telephone Encounter (Signed)
 Requesting: tramadol 50mg  Contract: No UDS: no Last Visit: 12/19/2022 Next Visit: Visit date not found Last Refill: looks like dr Merlyn Lot filled 02/23/18  Please Advise

## 2023-03-09 NOTE — Telephone Encounter (Unsigned)
 Copied from CRM [id number]. Topic: Clinical - Medication Refill >> Mar 09, 2023  3:25 PM Deaijah H wrote: Most Recent Primary Care Visit:  Provider: Seabron Spates R  Department: [facility]  Visit Type: PHYSICAL  Date: 12/19/2022  Medication: traMADol (ULTRAM) 50 MG tablet  Has the patient contacted their pharmacy? Yes (Agent: If no, request that the patient contact the pharmacy for the refill. If patient does not wish to contact the pharmacy document the reason why and proceed with request.) (Agent: If yes, when and what did the pharmacy advise?) Stated they do not have anything  Is this the correct pharmacy for this prescription? Yes If no, delete pharmacy and type the correct one.  This is the patient's preferred pharmacy:  [facility] [place] [address] [place], [place] - [address] [place] [address] Phone: [phone] Fax: [phone]   Has the prescription been filled recently? No  Is the patient out of the medication? Yes  Has the patient been seen for an appointment in the last year OR does the patient have an upcoming appointment? Yes  Can we respond through MyChart? Yes  Agent: Please be advised that Rx refills may take up to 3 business days. We ask that you follow-up with your pharmacy.

## 2023-03-09 NOTE — Telephone Encounter (Signed)
 Patient contacted the office and states she is having left elbow pain. Patient states its feels like she hit her elbow and has radiating pain but patient states she has not done anything to her elbow. Patient states her husband is having surgery on 03/16/2023 and she wants to be able to help him. Patient states she was prescribed muscle relaxer's but those make her sleep. Patient states she needs something to help if that is an option. Patient states she does take the hydroxychloroquine as prescribed. Patient states if anything can be sent in she would like it to be sent to [facility] on [address]. Please advise.

## 2023-03-09 NOTE — Telephone Encounter (Signed)
 Patient advised Dr. Corliss Skains reviewed patient's medication list. She already has a prescription of Voltaren and tramadol from her PCP. No other medication can be added. She can use topical Voltaren gel. Patient states she does not have any Tramadol. Patient states she will contact her PCP to see if they will refill it.

## 2023-03-09 NOTE — Telephone Encounter (Signed)
 I reviewed patient's medication list.  She already has a prescription of Voltaren and tramadol from her PCP.  No other medication can be added.  She can use topical Voltaren gel.

## 2023-03-11 MED ORDER — TRAMADOL HCL 50 MG PO TABS: 50.0000 mg | ORAL_TABLET | Freq: Four times a day (QID) | ORAL | 1 refills | Status: AC | PRN

## 2023-03-18 ENCOUNTER — Encounter: Payer: Self-pay | Admitting: Gastroenterology

## 2023-03-31 ENCOUNTER — Ambulatory Visit (INDEPENDENT_AMBULATORY_CARE_PROVIDER_SITE_OTHER): Admitting: Physician Assistant

## 2023-03-31 ENCOUNTER — Encounter: Payer: Self-pay | Admitting: Physician Assistant

## 2023-03-31 VITALS — BP 124/79 | HR 83 | Ht 65.0 in | Wt 181.8 lb

## 2023-03-31 DIAGNOSIS — G43811 Other migraine, intractable, with status migrainosus: Secondary | ICD-10-CM

## 2023-03-31 DIAGNOSIS — R110 Nausea: Secondary | ICD-10-CM

## 2023-03-31 MED ORDER — PROMETHAZINE HCL 25 MG/ML IJ SOLN
12.5000 mg | Freq: Once | INTRAMUSCULAR | Status: AC
  Administered 2023-03-31: 12.5 mg via INTRAMUSCULAR

## 2023-03-31 MED ORDER — SUMATRIPTAN SUCCINATE 50 MG PO TABS: 50.0000 mg | ORAL_TABLET | Freq: Once | ORAL | 0 refills | Status: DC

## 2023-03-31 NOTE — Progress Notes (Signed)
 Established patient visit   Patient: Kimberly Escobar   DOB: 29-Mar-1955   68 y.o. Female  MRN: [id number] Visit Date: 03/31/2023  Today's healthcare provider: Alfredia Ferguson, PA-C  Cc. Migraine  Subjective    Pt reports with a headache on and off x 4 days, she reports this feels like her typical migraine but is lasting longer. She reports nausea associated with it, denies light sensitvity.   Today, she took tylenol and an Excedrin, ~ 4 hours ago. She does not have any other migraine meds, used to take sumatriptan.   Medications: Outpatient Medications Prior to Visit  Medication Sig   acyclovir (ZOVIRAX) 400 MG tablet Take 1 tablet (400 mg total) by mouth daily as needed.   acyclovir ointment (ZOVIRAX) 5 % Apply topically 3 hours as needed   aspirin 81 MG tablet Take 81 mg by mouth as needed.   cholecalciferol (VITAMIN D3) 25 MCG (1000 UNIT) tablet Take 1,000 Units by mouth daily.   cyclobenzaprine (FLEXERIL) 5 MG tablet Take 1 tablet (5 mg total) by mouth 3 (three) times daily as needed for muscle spasms.   diclofenac (VOLTAREN) 75 MG EC tablet TAKE 1 TABLET BY MOUTH TWICE DAILY WITH FOOD   fluticasone (FLONASE) 50 MCG/ACT nasal spray SHAKE LIQUID AND USE 2 SPRAYS IN EACH NOSTRIL DAILY   furosemide (LASIX) 40 MG tablet TAKE 1 TABLET(40 MG) BY MOUTH DAILY   gabapentin (NEURONTIN) 100 MG capsule Take by mouth as needed.   hydroxychloroquine (PLAQUENIL) 200 MG tablet TAKE 1 TABLET BY MOUTH TWICE DAILY MONDAY THROUGH FRIDAY   loratadine (CLARITIN) 10 MG tablet Take 1 tablet (10 mg total) by mouth daily.   multivitamin-lutein (OCUVITE-LUTEIN) CAPS capsule Take by mouth.   NONFORMULARY OR COMPOUNDED ITEM Compression stockings  20-30 mm/ hg   dx varicose veins   OMEGA-3 FATTY ACIDS PO Take by mouth as needed.   pantoprazole (PROTONIX) 40 MG tablet Take 1 tablet (40 mg total) by mouth 2 (two) times daily.   tiZANidine (ZANAFLEX) 4 MG tablet Take 1 tablet (4 mg total) by mouth every 6  (six) hours as needed for muscle spasms.   traMADol (ULTRAM) 50 MG tablet Take 1 tablet (50 mg total) by mouth every 6 (six) hours as needed.   TURMERIC CURCUMIN PO Take 1 capsule by mouth daily.   Tyrosine 500 MG CAPS Take 1 capsule (500 mg total) by mouth daily. (Patient not taking: Reported on 11/18/2022)   [DISCONTINUED] UNABLE TO FIND Med Name: Inno glow   No facility-administered medications prior to visit.    Review of Systems  Constitutional:  Negative for fatigue and fever.  Respiratory:  Negative for cough and shortness of breath.   Cardiovascular:  Negative for chest pain and leg swelling.  Gastrointestinal:  Positive for nausea. Negative for abdominal pain.  Neurological:  Positive for headaches. Negative for dizziness.       Objective    BP 124/79   Pulse 83   Ht 5\' 5"  (1.651 m)   Wt 181 lb 12.8 oz (82.5 kg)   BMI 30.25 kg/m    Physical Exam Constitutional:      General: She is awake.     Appearance: She is well-developed.  HENT:     Head: Normocephalic.  Eyes:     Extraocular Movements: Extraocular movements intact.     Conjunctiva/sclera: Conjunctivae normal.     Pupils: Pupils are equal, round, and reactive to light.  Cardiovascular:  Rate and Rhythm: Normal rate and regular rhythm.     Heart sounds: Normal heart sounds.  Pulmonary:     Effort: Pulmonary effort is normal.     Breath sounds: Normal breath sounds.  Skin:    General: Skin is warm.  Neurological:     General: No focal deficit present.     Mental Status: She is alert and oriented to person, place, and time.  Psychiatric:        Attention and Perception: Attention normal.        Mood and Affect: Mood normal.        Speech: Speech normal.        Behavior: Behavior is cooperative.      No results found for any visits on 03/31/23.  Assessment & Plan    Nausea -     Promethazine HCl  Other migraine with status migrainosus, intractable -     Promethazine HCl -     SUMAtriptan  Succinate; Take 1 tablet (50 mg total) by mouth once for 1 dose. May repeat in 2 hours if headache persists or recurs.  Dispense: 10 tablet; Refill: 0   Given pt has taken nsaids today, no toradol IM  Ordered Phenergen 12.5 mg IM, pt tolerated well, reports a slight improvement in symptoms.  She used to be on sumatriptan-- sent in rx in case migraine returns   Return if symptoms worsen or fail to improve.     Alfredia Ferguson, PA-C  [facility] at [facility] [phone] (phone) [phone] (fax)  [facility]

## 2023-04-03 ENCOUNTER — Ambulatory Visit: Payer: Self-pay | Admitting: *Deleted

## 2023-04-03 NOTE — Telephone Encounter (Signed)
 This Triage RN attempted to call patient at this time. "Call could not be completed as dialed." 2nd attempt.

## 2023-04-03 NOTE — Telephone Encounter (Signed)
 3rd attempt to contact patient to review medication request for ear drops. LOV 03/31/23. Please advise if ear drops can be prescribed.  No contact with patient made calling [phone] busy signal this call. Unable to leave message to call back.   Chief Complaint: medication requested for ear drops. See LOV 03/31/23 Symptoms: na Frequency: na Pertinent Negatives: Patient denies na Disposition: [] ED /[] Urgent Care (no appt availability in office) / [] Appointment(In office/virtual)/ []  [place] [facility]/ [] Home Care/ [] Refused Recommended Disposition /[] [name]/ []  Follow-up with PCP Additional Notes:    Please advise.   Reason for Disposition . Third attempt to contact caller AND no contact made. Phone number verified.  Answer Assessment - Initial Assessment Questions N/A No contact with patient to review request for ear drops.  Protocols used: No Contact or Duplicate Contact Call-A-AH

## 2023-04-03 NOTE — Telephone Encounter (Signed)
 Copied from CRM [id number]. Topic: Clinical - Medical Advice >> Apr 03, 2023 10:31 AM Elmarie Shiley S wrote: Reason for CRM: Patient is asking if doctor can prescribe ear drops Please follow up with patient

## 2023-04-03 NOTE — Telephone Encounter (Signed)
 Called patient [phone] to review request for ear drops. Recording call can not be completed as dial consult directory and try again later. Called [phone] and # no longer in service. Unable to leave VM to call back.

## 2023-04-06 ENCOUNTER — Ambulatory Visit: Payer: Self-pay

## 2023-04-06 ENCOUNTER — Encounter: Payer: Self-pay | Admitting: Physician Assistant

## 2023-04-06 ENCOUNTER — Ambulatory Visit (INDEPENDENT_AMBULATORY_CARE_PROVIDER_SITE_OTHER): Admitting: Physician Assistant

## 2023-04-06 VITALS — BP 121/73 | HR 79 | Ht 65.0 in | Wt 180.6 lb

## 2023-04-06 DIAGNOSIS — R42 Dizziness and giddiness: Secondary | ICD-10-CM

## 2023-04-06 DIAGNOSIS — G43811 Other migraine, intractable, with status migrainosus: Secondary | ICD-10-CM | POA: Diagnosis not present

## 2023-04-06 MED ORDER — MECLIZINE HCL 25 MG PO TABS: 25.0000 mg | ORAL_TABLET | Freq: Three times a day (TID) | ORAL | 0 refills | Status: DC | PRN

## 2023-04-06 NOTE — Telephone Encounter (Signed)
  Chief Complaint: dizziness not improved Symptoms: nausea, dizziness with moving, migraine-resolved, Frequency: over 1 week Pertinent Negatives: Patient denies numbness, weakness, vision changes, speech changes Disposition: [] ED /[] Urgent Care (no appt availability in office) / [x] Appointment(In office/virtual)/ []  [place] [facility]/ [] Home Care/ [] Refused Recommended Disposition /[] [facility]/ []  Follow-up with PCP Additional Notes: Pt states she was seen last week for dizziness and migraine. Pt states that she got a shot and rx. Pt states that it only helped for 4 hours. Pt states that the dizziness is more constant. Pt states she would just like to feel better. Pt denies any numbness, weakness, vision nor speech changes. Pt advised to watch for numbness, weakness, vision and speech problems and report to ED or call us back if s/s arise, pt agreeable. Pt sched today.  Copied from CRM [id number]. Topic: Clinical - Red Word Triage >> Apr 06, 2023  8:45 AM Efraim Kaufmann C wrote: Red Word that prompted transfer to Nurse Triage: patient is still having dizziness and not feeling well after seeing the doctor several days ago. Reason for Disposition  [1] MODERATE dizziness (e.g., vertigo; feels very unsteady, interferes with normal activities) AND [2] has been evaluated by doctor (or NP/PA) for this  Answer Assessment - Initial Assessment Questions 1. DESCRIPTION: "Describe your dizziness."     Not move to much because it makes me feel like I might lose my step or something, nervous I may fall.  2. VERTIGO: "Do you feel like either you or the room is spinning or tilting?"      denies 3. LIGHTHEADED: "Do you feel lightheaded?" (e.g., somewhat faint, woozy, weak upon standing)     Yes,  4. SEVERITY: "How bad is it?"  "Can you walk?"   - MILD: Feels slightly dizzy and unsteady, but is walking normally.   - MODERATE: Feels unsteady when walking, but not falling; interferes with normal  activities (e.g., school, work).   - SEVERE: Unable to walk without falling, or requires assistance to walk without falling.     Yes I can walk but I'm nauseated 5. ONSET:  "When did the dizziness begin?"     I have had the migraine now for about 4 days 6. AGGRAVATING FACTORS: "Does anything make it worse?" (e.g., standing, change in head position)    Changing in position makes me feel like I might fall 7. CAUSE: "What do you think is causing the dizziness?"     unsure 8. RECURRENT SYMPTOM: "Have you had dizziness before?" If Yes, ask: "When was the last time?" "What happened that time?"     Yes, but I have migraine headaches 9. OTHER SYMPTOMS: "Do you have any other symptoms?" (e.g., headache, weakness, numbness, vomiting, earache)     Migraine, nausea,  Protocols used: Dizziness - Vertigo-A-AH

## 2023-04-06 NOTE — Progress Notes (Signed)
 Established patient visit   Patient: Kimberly Escobar   DOB: 1955-09-21   68 y.o. Female  MRN: [id number] Visit Date: 04/06/2023  Today's healthcare provider: Alfredia Ferguson, PA-C   Cc. Nausea, dizziness  Subjective    Pt was seen 03/31/23 for nausea, migraine.   Pt reports her headache lasted until Thurs/Friday, she took several doses of sumatriptian with eventual relief.  She reports dizziness started Friday, a unsteadiness, when she closes her eyes she feels like she might fall. Some pressure in R eye w/ PND.  Denies headache, weakness, paresthesias, changes in vision.   Medications: Outpatient Medications Prior to Visit  Medication Sig   acyclovir (ZOVIRAX) 400 MG tablet Take 1 tablet (400 mg total) by mouth daily as needed.   acyclovir ointment (ZOVIRAX) 5 % Apply topically 3 hours as needed   aspirin 81 MG tablet Take 81 mg by mouth as needed.   cholecalciferol (VITAMIN D3) 25 MCG (1000 UNIT) tablet Take 1,000 Units by mouth daily.   cyclobenzaprine (FLEXERIL) 5 MG tablet Take 1 tablet (5 mg total) by mouth 3 (three) times daily as needed for muscle spasms.   diclofenac (VOLTAREN) 75 MG EC tablet TAKE 1 TABLET BY MOUTH TWICE DAILY WITH FOOD   fluticasone (FLONASE) 50 MCG/ACT nasal spray SHAKE LIQUID AND USE 2 SPRAYS IN EACH NOSTRIL DAILY   furosemide (LASIX) 40 MG tablet TAKE 1 TABLET(40 MG) BY MOUTH DAILY   gabapentin (NEURONTIN) 100 MG capsule Take by mouth as needed.   hydroxychloroquine (PLAQUENIL) 200 MG tablet TAKE 1 TABLET BY MOUTH TWICE DAILY MONDAY THROUGH FRIDAY   loratadine (CLARITIN) 10 MG tablet Take 1 tablet (10 mg total) by mouth daily.   multivitamin-lutein (OCUVITE-LUTEIN) CAPS capsule Take by mouth.   NONFORMULARY OR COMPOUNDED ITEM Compression stockings  20-30 mm/ hg   dx varicose veins   OMEGA-3 FATTY ACIDS PO Take by mouth as needed.   pantoprazole (PROTONIX) 40 MG tablet Take 1 tablet (40 mg total) by mouth 2 (two) times daily.   tiZANidine  (ZANAFLEX) 4 MG tablet Take 1 tablet (4 mg total) by mouth every 6 (six) hours as needed for muscle spasms.   traMADol (ULTRAM) 50 MG tablet Take 1 tablet (50 mg total) by mouth every 6 (six) hours as needed.   TURMERIC CURCUMIN PO Take 1 capsule by mouth daily.   Tyrosine 500 MG CAPS Take 1 capsule (500 mg total) by mouth daily.   SUMAtriptan (IMITREX) 50 MG tablet Take 1 tablet (50 mg total) by mouth once for 1 dose. May repeat in 2 hours if headache persists or recurs.   No facility-administered medications prior to visit.    Review of Systems  Constitutional:  Negative for fatigue and fever.  Respiratory:  Negative for cough and shortness of breath.   Cardiovascular:  Negative for chest pain and leg swelling.  Gastrointestinal:  Positive for nausea. Negative for abdominal pain.  Neurological:  Positive for dizziness. Negative for headaches.       Objective    BP 121/73   Pulse 79   Ht 5\' 5"  (1.651 m)   Wt 180 lb 9.6 oz (81.9 kg)   BMI 30.05 kg/m    Physical Exam Constitutional:      General: She is awake.     Appearance: She is well-developed.  HENT:     Head: Normocephalic.     Right Ear: Tympanic membrane normal.     Left Ear: Tympanic membrane normal.  Eyes:  Extraocular Movements: Extraocular movements intact.     Conjunctiva/sclera: Conjunctivae normal.     Pupils: Pupils are equal, round, and reactive to light.  Cardiovascular:     Rate and Rhythm: Normal rate and regular rhythm.     Heart sounds: Normal heart sounds.  Pulmonary:     Effort: Pulmonary effort is normal.  Skin:    General: Skin is warm.  Neurological:     General: No focal deficit present.     Mental Status: She is alert and oriented to person, place, and time.     Comments: BPPV testing negative-- dix hallpike negative  Psychiatric:        Attention and Perception: Attention normal.        Mood and Affect: Mood normal.        Speech: Speech normal.        Behavior: Behavior is  cooperative.     No results found for any visits on 04/06/23.  Assessment & Plan    Dizziness -     Meclizine HCl; Take 1 tablet (25 mg total) by mouth 3 (three) times daily as needed for dizziness.  Dispense: 30 tablet; Refill: 0 -     Ambulatory referral to Neurology  Other migraine with status migrainosus, intractable -     Ambulatory referral to Neurology  Referring to neuro given atypical migraine + vertigo symptoms. No nystagmus, dix hallpike negative. Will tx with meclizine. Some allergy symptoms but no signs of sinusitis.  No acute deficits. Given pt ED recommendations-- if recurrence of headache, vision changes, weakness, numbness, paresthesias.   Return if symptoms worsen or fail to improve.      Alfredia Ferguson, PA-C  [facility] at [facility] [phone] (phone) [phone] (fax)  [facility]

## 2023-04-08 ENCOUNTER — Ambulatory Visit: Payer: HMO | Admitting: Obstetrics & Gynecology

## 2023-04-08 VITALS — BP 117/78 | HR 82 | Ht 65.0 in | Wt 180.0 lb

## 2023-04-08 DIAGNOSIS — M8580 Other specified disorders of bone density and structure, unspecified site: Secondary | ICD-10-CM | POA: Diagnosis not present

## 2023-04-08 DIAGNOSIS — Z780 Asymptomatic menopausal state: Secondary | ICD-10-CM

## 2023-04-08 DIAGNOSIS — Z01419 Encounter for gynecological examination (general) (routine) without abnormal findings: Secondary | ICD-10-CM | POA: Diagnosis not present

## 2023-04-08 NOTE — Progress Notes (Signed)
 Subjective:     Kimberly Escobar is a 68 y.o. female here for a routine exam. [id number] ([place], [place], [place] and [place]).  Current complaints: none. Pt denies bleeding or other sx. She walks 3x/week. She takes Ca++ occ but, here sister had problems with it so she is hesitant to take it. She does take her Vit D.   Had colonoscopy in 2024.    Gynecologic History No LMP recorded. Patient is postmenopausal. Contraception: post menopausal status Last Pap: 07/11/2019. Results were: normal Last mammogram: 10/27/2022. Results were: normal  Obstetric History OB History  Gravida Para Term Preterm AB Living  4 4 3   4   SAB IAB Ectopic Multiple Live Births      4    # Outcome Date GA Lbr Len/2nd Weight Sex Type Anes PTL Lv  4 Term 1994 [redacted]w[redacted]d   M CS-Classical EPI  LIV  3 Para 1977 [redacted]w[redacted]d   M CS-Classical EPI  LIV  2 Term 105    M CS-Classical EPI  LIV  1 Term 1975 [redacted]w[redacted]d   M CS-Classical EPI  LIV     The following portions of the patient's history were reviewed and updated as appropriate: allergies, current medications, past family history, past medical history, past social history, past surgical history, and problem list.  Review of Systems Pertinent items are noted in HPI.    Objective:  BP 117/78   Pulse 82   Ht 5\' 5"  (1.651 m)   Wt 180 lb (81.6 kg)   BMI 29.95 kg/m  General Appearance:    Alert, cooperative, no distress, appears stated age  Head:    Normocephalic, without obvious abnormality, atraumatic  Eyes:    conjunctiva/corneas clear, EOM's intact, both eyes  Ears:    Normal external ear canals, both ears  Nose:   Nares normal, septum midline, mucosa normal, no drainage    or sinus tenderness  Throat:   Lips, mucosa, and tongue normal; teeth and gums normal  Neck:   Supple, symmetrical, trachea midline, no adenopathy;    thyroid:  no enlargement/tenderness/nodules  Back:     Symmetric, no curvature, ROM normal, no CVA tenderness  Lungs:     respirations unlabored  Chest Wall:    No  tenderness or deformity   Heart:    Regular rate and rhythm  Breast Exam:    No tenderness, masses, or nipple abnormality  Abdomen:     Soft, non-tender, bowel sounds active all four quadrants,    no masses, no organomegaly  Genitalia:    Normal female without lesion, discharge or tenderness     Extremities:   Extremities normal, atraumatic, no cyanosis or edema  Pulses:   2+ and symmetric all extremities  Skin:   Skin color, texture, turgor normal, no rashes or lesions     Assessment:    Healthy female exam.  H/o osteopenia- post menopausal    Plan:  Kimberly Escobar was seen today for gynecologic exam.  Diagnoses and all orders for this visit:  Well female exam with routine gynecological exam  Osteopenia after menopause -     DG Bone Density; Future   F/u in 1 year or sooner prn   Ventura Leggitt L. Harraway-Smith, M.D., [place]
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
## 2023-04-14 HISTORY — PX: SQUAMOUS CELL CARCINOMA EXCISION: SHX2433

## 2023-04-16 DIAGNOSIS — L80 Vitiligo: Secondary | ICD-10-CM | POA: Diagnosis not present

## 2023-04-16 DIAGNOSIS — L819 Disorder of pigmentation, unspecified: Secondary | ICD-10-CM | POA: Diagnosis not present

## 2023-04-16 DIAGNOSIS — L821 Other seborrheic keratosis: Secondary | ICD-10-CM | POA: Diagnosis not present

## 2023-04-16 DIAGNOSIS — D1801 Hemangioma of skin and subcutaneous tissue: Secondary | ICD-10-CM | POA: Diagnosis not present

## 2023-04-16 DIAGNOSIS — L739 Follicular disorder, unspecified: Secondary | ICD-10-CM | POA: Diagnosis not present

## 2023-04-16 DIAGNOSIS — D225 Melanocytic nevi of trunk: Secondary | ICD-10-CM | POA: Diagnosis not present

## 2023-04-16 DIAGNOSIS — D235 Other benign neoplasm of skin of trunk: Secondary | ICD-10-CM | POA: Diagnosis not present

## 2023-04-16 DIAGNOSIS — L930 Discoid lupus erythematosus: Secondary | ICD-10-CM | POA: Diagnosis not present

## 2023-04-16 DIAGNOSIS — Z86018 Personal history of other benign neoplasm: Secondary | ICD-10-CM | POA: Diagnosis not present

## 2023-04-16 DIAGNOSIS — L814 Other melanin hyperpigmentation: Secondary | ICD-10-CM | POA: Diagnosis not present

## 2023-04-16 DIAGNOSIS — D485 Neoplasm of uncertain behavior of skin: Secondary | ICD-10-CM | POA: Diagnosis not present

## 2023-04-16 DIAGNOSIS — L578 Other skin changes due to chronic exposure to nonionizing radiation: Secondary | ICD-10-CM | POA: Diagnosis not present

## 2023-04-17 DIAGNOSIS — H25813 Combined forms of age-related cataract, bilateral: Secondary | ICD-10-CM | POA: Diagnosis not present

## 2023-04-17 DIAGNOSIS — H5203 Hypermetropia, bilateral: Secondary | ICD-10-CM | POA: Diagnosis not present

## 2023-04-17 DIAGNOSIS — H524 Presbyopia: Secondary | ICD-10-CM | POA: Diagnosis not present

## 2023-04-21 ENCOUNTER — Ambulatory Visit (AMBULATORY_SURGERY_CENTER)

## 2023-04-21 VITALS — Ht 65.0 in | Wt 180.0 lb

## 2023-04-21 DIAGNOSIS — K259 Gastric ulcer, unspecified as acute or chronic, without hemorrhage or perforation: Secondary | ICD-10-CM

## 2023-04-21 NOTE — Progress Notes (Signed)

## 2023-04-22 ENCOUNTER — Ambulatory Visit: Attending: Rheumatology | Admitting: Rheumatology

## 2023-04-22 ENCOUNTER — Encounter: Payer: Self-pay | Admitting: Rheumatology

## 2023-04-22 VITALS — BP 124/85 | HR 76 | Resp 13 | Ht 65.0 in | Wt 180.6 lb

## 2023-04-22 DIAGNOSIS — G8929 Other chronic pain: Secondary | ICD-10-CM

## 2023-04-22 DIAGNOSIS — E785 Hyperlipidemia, unspecified: Secondary | ICD-10-CM

## 2023-04-22 DIAGNOSIS — M7062 Trochanteric bursitis, left hip: Secondary | ICD-10-CM

## 2023-04-22 DIAGNOSIS — M170 Bilateral primary osteoarthritis of knee: Secondary | ICD-10-CM

## 2023-04-22 DIAGNOSIS — M797 Fibromyalgia: Secondary | ICD-10-CM

## 2023-04-22 DIAGNOSIS — M351 Other overlap syndromes: Secondary | ICD-10-CM

## 2023-04-22 DIAGNOSIS — M8589 Other specified disorders of bone density and structure, multiple sites: Secondary | ICD-10-CM | POA: Diagnosis not present

## 2023-04-22 DIAGNOSIS — I872 Venous insufficiency (chronic) (peripheral): Secondary | ICD-10-CM

## 2023-04-22 DIAGNOSIS — G4709 Other insomnia: Secondary | ICD-10-CM | POA: Diagnosis not present

## 2023-04-22 DIAGNOSIS — M533 Sacrococcygeal disorders, not elsewhere classified: Secondary | ICD-10-CM

## 2023-04-22 DIAGNOSIS — M19041 Primary osteoarthritis, right hand: Secondary | ICD-10-CM

## 2023-04-22 DIAGNOSIS — Z79899 Other long term (current) drug therapy: Secondary | ICD-10-CM

## 2023-04-22 DIAGNOSIS — Z9889 Other specified postprocedural states: Secondary | ICD-10-CM | POA: Diagnosis not present

## 2023-04-22 DIAGNOSIS — M5030 Other cervical disc degeneration, unspecified cervical region: Secondary | ICD-10-CM

## 2023-04-22 DIAGNOSIS — M19042 Primary osteoarthritis, left hand: Secondary | ICD-10-CM

## 2023-04-22 DIAGNOSIS — Z8719 Personal history of other diseases of the digestive system: Secondary | ICD-10-CM

## 2023-04-22 DIAGNOSIS — Z8669 Personal history of other diseases of the nervous system and sense organs: Secondary | ICD-10-CM

## 2023-04-22 NOTE — Patient Instructions (Signed)
 Chronic Venous Insufficiency Chronic venous insufficiency is a condition that causes the veins in the legs to struggle to pump blood from the legs to the heart. It is also called venous stasis. This condition can happen when the vein walls are stretched, weakened, or damaged. It can also happen when the valves inside the vein are damaged. With the right treatment, you should be able to still lead an active life. What are the causes? Common causes of this condition include: Venous hypertension. This is high blood pressure inside the veins. Sitting or standing too long. This can cause increased blood pressure in the veins of the leg. Deep vein thrombosis (DVT). This is a blood clot that blocks blood flow in a vein. Phlebitis. This is inflammation of a vein. It can cause a blood clot to form. An abnormal growth of cells (tumor) in the area between your hip bones (pelvis). This can cause blood to back up. What increases the risk? Factors that may make you more likely to get this condition include: Having a family history of the condition. Being overweight. Being pregnant. Not getting enough exercise. Smoking. Having a job that requires you to sit or stand in one place for a long time. Being a certain age. Females in their 12s and 48s and males in their 70s are more likely to get this condition. What are the signs or symptoms? Symptoms of this condition include: Varicose veins. These are veins that are enlarged, bulging, or twisted. Skin breakdown or ulcers. Reddened skin or dark discoloration of the skin on the leg between the knee and ankle. Lipodermatosclerosis. This is Likins, smooth, tight, and painful skin just above the ankle. It is often on the inside of the leg. Swelling of the legs. How is this diagnosed? This condition may be diagnosed based on your medical history and a physical exam. You may also need tests, such as: A duplex ultrasound. This shows how blood flows through a blood  vessel. Plethysmography. This tests blood flow. Venogram. This looks at the veins using an X-ray and dye. How is this treated? The goals of treatment are to help you return to an active life and to relieve pain. Treatment may include: Wearing compression stockings. These do not cure the condition but can help relieve symptoms. They can also help stop your condition from getting worse. Sclerotherapy. This involves injecting a solution to shrink damaged veins. Surgery. This may include: Vein stripping. This is when a diseased vein is taken out. Laser ablation surgery. This is when blood flow is cut off through the vein. Repairing or remaking a valve inside the affected vein. Follow these instructions at home: Lifestyle Do not use any products that contain nicotine or tobacco. These products include cigarettes, chewing tobacco, and vaping devices, such as e-cigarettes. If you need help quitting, ask your health care provider. Stay active. Exercise, walk, or do other activities. Ask your provider what activities are safe for you. General instructions Take over-the-counter and prescription medicines only as told by your provider. Drink enough fluid to keep your pee (urine) pale yellow. Wear compression stockings as told by your provider. These stockings help to prevent blood clots and reduce swelling in your legs. Keep all follow-up visits. Your provider will check your legs for any changes and adjust your treatment plan as needed. Contact a health care provider if: You have redness, swelling, or more pain in the affected area. You see a red streak or line that goes up or down from the  area. You have skin breakdown or skin loss. You get an injury in the affected area. You get a fever. Get help right away if: You have severe pain that does not get better with medicine. You get an injury and an open wound in the affected area. Your foot or ankle becomes numb or weak all of a sudden. You have  trouble moving your foot or ankle. Your symptoms do not go away or get worse. You have chest pain. You have shortness of breath. These symptoms may be an emergency. Get help right away. Call 911. Do not wait to see if the symptoms will go away. Do not drive yourself to the hospital. This information is not intended to replace advice given to you by your health care provider. Make sure you discuss any questions you have with your health care provider. Document Revised: 01/14/2022 Document Reviewed: 01/14/2022 Elsevier Patient Education  2024 [employer]  Standing Labs We placed an order today for your standing lab work.   Please have your standing labs drawn in 1 month  Please have your labs drawn 2 weeks prior to your appointment so that the provider can discuss your lab results at your appointment, if possible.  Please note that you may see your imaging and lab results in MyChart before we have reviewed them. We will contact you once all results are reviewed. Please allow our office up to 72 hours to thoroughly review all of the results before contacting the office for clarification of your results.  WALK-IN LAB HOURS  Monday through Thursday from 8:00 am -12:30 pm and 1:00 pm-5:00 pm and Friday from 8:00 am-12:00 pm.  Patients with office visits requiring labs will be seen before walk-in labs.  You may encounter longer than normal wait times. Please allow additional time. Wait times may be shorter on  Monday and Thursday afternoons.  We do not book appointments for walk-in labs. We appreciate your patience and understanding with our staff.   Labs are drawn by [facility]. Please bring your co-pay at the time of your lab draw.  You may receive a bill from [facility] for your lab work.  Please note if you are on Hydroxychloroquine and and an order has been placed for a Hydroxychloroquine level,  you will need to have it drawn 4 hours or more after your last dose.  If you wish to have your  labs drawn at another location, please call the office 24 hours in advance so we can fax the orders.  The office is located at [address], [address], [place], [place] [address]   If you have any questions regarding directions or hours of operation,  please call [phone].   As a reminder, please drink plenty of water prior to coming for your lab work. Thanks!   Vaccines You are taking a medication(s) that can suppress your immune system.  The following immunizations are recommended: Flu annually Covid-19  Td/Tdap (tetanus, diphtheria, pertussis) every 10 years Pneumonia (Prevnar 15 then Pneumovax 23 at least 1 year apart.  Alternatively, can take Prevnar 20 without needing additional dose) Shingrix: 2 doses from 4 weeks to 6 months apart  Please check with your PCP to make sure you are up to date.

## 2023-04-29 ENCOUNTER — Other Ambulatory Visit: Payer: Self-pay | Admitting: Physician Assistant

## 2023-04-29 DIAGNOSIS — R42 Dizziness and giddiness: Secondary | ICD-10-CM

## 2023-04-30 ENCOUNTER — Ambulatory Visit: Payer: HMO | Admitting: Rheumatology

## 2023-05-06 ENCOUNTER — Other Ambulatory Visit: Payer: Self-pay | Admitting: Family Medicine

## 2023-05-06 ENCOUNTER — Encounter: Payer: Self-pay | Admitting: Gastroenterology

## 2023-05-06 DIAGNOSIS — R1033 Periumbilical pain: Secondary | ICD-10-CM

## 2023-05-12 ENCOUNTER — Ambulatory Visit: Admitting: Gastroenterology

## 2023-05-12 ENCOUNTER — Encounter: Payer: Self-pay | Admitting: Gastroenterology

## 2023-05-12 VITALS — BP 125/95 | HR 73 | Temp 98.3°F | Resp 14 | Ht 65.0 in | Wt 180.0 lb

## 2023-05-12 DIAGNOSIS — E785 Hyperlipidemia, unspecified: Secondary | ICD-10-CM | POA: Diagnosis not present

## 2023-05-12 DIAGNOSIS — K2950 Unspecified chronic gastritis without bleeding: Secondary | ICD-10-CM | POA: Diagnosis not present

## 2023-05-12 DIAGNOSIS — K259 Gastric ulcer, unspecified as acute or chronic, without hemorrhage or perforation: Secondary | ICD-10-CM | POA: Diagnosis not present

## 2023-05-12 DIAGNOSIS — K219 Gastro-esophageal reflux disease without esophagitis: Secondary | ICD-10-CM | POA: Diagnosis not present

## 2023-05-12 DIAGNOSIS — I10 Essential (primary) hypertension: Secondary | ICD-10-CM | POA: Diagnosis not present

## 2023-05-12 DIAGNOSIS — M797 Fibromyalgia: Secondary | ICD-10-CM | POA: Diagnosis not present

## 2023-05-12 DIAGNOSIS — R1310 Dysphagia, unspecified: Secondary | ICD-10-CM | POA: Diagnosis not present

## 2023-05-12 DIAGNOSIS — K449 Diaphragmatic hernia without obstruction or gangrene: Secondary | ICD-10-CM

## 2023-05-12 MED ORDER — SODIUM CHLORIDE 0.9 % IV SOLN: 500.0000 mL | Freq: Once | INTRAVENOUS | Status: DC

## 2023-05-12 NOTE — Op Note (Signed)
 [facility] Patient Name: Kimberly Escobar Procedure Date: 05/12/2023 2:18 PM MRN: [id number] Endoscopist: Yong Henle , MD, [id number] Age: 68 Referring MD:  Date of Birth: 23-Aug-1955 Gender: Female Account #: [id number] Procedure:                Upper GI endoscopy Indications:              Dysphagia (improved per patient report), Follow-up                            of intestinal metaplasia Medicines:                Monitored Anesthesia Care Procedure:                Pre-Anesthesia Assessment:                           - Prior to the procedure, a History and Physical                            was performed, and patient medications and                            allergies were reviewed. The patient's tolerance of                            previous anesthesia was also reviewed. The risks                            and benefits of the procedure and the sedation                            options and risks were discussed with the patient.                            All questions were answered, and informed consent                            was obtained. Prior Anticoagulants: The patient has                            taken no anticoagulant or antiplatelet agents. ASA                            Grade Assessment: II - A patient with mild systemic                            disease. After reviewing the risks and benefits,                            the patient was deemed in satisfactory condition to                            undergo the procedure.  After obtaining informed consent, the endoscope was                            passed under direct vision. Throughout the                            procedure, the patient's blood pressure, pulse, and                            oxygen saturations were monitored continuously. The                            Olympus Scope [id number] was introduced through the                            mouth, and advanced  to the second part of duodenum.                            The upper GI endoscopy was accomplished without                            difficulty. The patient tolerated the procedure. Scope In: Scope Out: Findings:                 No gross lesions were noted in the entire esophagus.                           The Z-line was irregular and was found 35 cm from                            the incisors.                           A 1 cm hiatal hernia was present.                           Patchy mildly erythematous mucosa without bleeding                            was found in the entire examined stomach. Biopsies                            were taken with a cold forceps for histology.                            Biopsies were taken with a cold forceps for                            histology. Biopsies were taken with a cold forceps                            for histology.  No gross lesions were noted in the duodenal bulb,                            in the first portion of the duodenum and in the                            second portion of the duodenum. Complications:            No immediate complications. Estimated Blood Loss:     Estimated blood loss was minimal. Impression:               - No gross lesions in the entire esophagus. Z-line                            irregular, 35 cm from the incisors.                           - 1 cm hiatal hernia.                           - Erythematous mucosa in the stomach. Biopsied.                           - No gross lesions in the duodenal bulb, in the                            first portion of the duodenum and in the second                            portion of the duodenum. Recommendation:           - The patient will be observed post-procedure,                            until all discharge criteria are met.                           - Discharge patient to home.                           - Patient has a contact number  available for                            emergencies. The signs and symptoms of potential                            delayed complications were discussed with the                            patient. Return to normal activities tomorrow.                            Written discharge instructions were provided to the  patient.                           - Resume previous diet.                           - Continue present medications.                           - Await pathology results.                           - Repeat upper endoscopy in likely 2-3 years for                            surveillance of GIM.                           - If dysphagia symptoms recur could consider repeat                            dilation within the next few years. Otherwise, will                            need manometry.                           - If issues still occuring at back of throat,                            consider ENT referral.                           - The findings and recommendations were discussed                            with the patient.                           - The findings and recommendations were discussed                            with the patient's family. Yong Henle, MD 05/12/2023 2:45:24 PM
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
## 2023-05-12 NOTE — Patient Instructions (Signed)
 Resume previous diet and medications.  Repeat upper endoscopy in 2-3 years.  Biopsy results will be sent via MyChart of by mail.    YOU HAD AN ENDOSCOPIC PROCEDURE TODAY AT THE [facility]:   Refer to the procedure report that was given to you for any specific questions about what was found during the examination.  If the procedure report does not answer your questions, please call your gastroenterologist to clarify.  If you requested that your care partner not be given the details of your procedure findings, then the procedure report has been included in a sealed envelope for you to review at your convenience later.  YOU SHOULD EXPECT: Some feelings of bloating in the abdomen. Passage of more gas than usual.  Walking can help get rid of the air that was put into your GI tract during the procedure and reduce the bloating. If you had a lower endoscopy (such as a colonoscopy or flexible sigmoidoscopy) you may notice spotting of blood in your stool or on the toilet paper. If you underwent a bowel prep for your procedure, you may not have a normal bowel movement for a few days.  Please Note:  You might notice some irritation and congestion in your nose or some drainage.  This is from the oxygen used during your procedure.  There is no need for concern and it should clear up in a day or so.  SYMPTOMS TO REPORT IMMEDIATELY:  Following upper endoscopy (EGD)  Vomiting of blood or coffee ground material  New chest pain or pain under the shoulder blades  Painful or persistently difficult swallowing  New shortness of breath  Fever of 100F or higher  Black, tarry-looking stools  For urgent or emergent issues, a gastroenterologist can be reached at any hour by calling [phone]. Do not use MyChart messaging for urgent concerns.   DIET:  We do recommend a small meal at first, but then you may proceed to your regular diet.  Drink plenty of fluids but you should avoid alcoholic beverages for  24 hours.  ACTIVITY:  You should plan to take it easy for the rest of today and you should NOT DRIVE or use heavy machinery until tomorrow (because of the sedation medicines used during the test).    FOLLOW UP: Our staff will call the number listed on your records the next business day following your procedure.  We will call around 7:15- 8:00 am to check on you and address any questions or concerns that you may have regarding the information given to you following your procedure. If we do not reach you, we will leave a message.     If any biopsies were taken you will be contacted by phone or by letter within the next 1-3 weeks.  Please call us  at [phone] if you have not heard about the biopsies in 3 weeks.    SIGNATURES/CONFIDENTIALITY: You and/or your care partner have signed paperwork which will be entered into your electronic medical record.  These signatures attest to the fact that that the information above on your After Visit Summary has been reviewed and is understood.  Full responsibility of the confidentiality of this discharge information lies with you and/or your care-partner.

## 2023-05-12 NOTE — Progress Notes (Unsigned)
 Sedate, gd SR, tolerated procedure well, VSS, report to RN

## 2023-05-12 NOTE — Progress Notes (Unsigned)
 Pt's states no medical or surgical changes since previsit or office visit.

## 2023-05-12 NOTE — Progress Notes (Unsigned)
 Called to room to assist during endoscopic procedure.  Patient ID and intended procedure confirmed with present staff. Received instructions for my participation in the procedure from the performing physician.

## 2023-05-13 ENCOUNTER — Telehealth: Payer: Self-pay

## 2023-05-13 NOTE — Telephone Encounter (Signed)
 No answer, left message to call if having any issues or concerns, B.Vale Mousseau RN

## 2023-05-18 ENCOUNTER — Other Ambulatory Visit: Payer: Self-pay | Admitting: Physician Assistant

## 2023-05-18 ENCOUNTER — Encounter: Payer: Self-pay | Admitting: Gastroenterology

## 2023-05-18 DIAGNOSIS — M351 Other overlap syndromes: Secondary | ICD-10-CM

## 2023-05-18 LAB — SURGICAL PATHOLOGY

## 2023-05-18 NOTE — Telephone Encounter (Signed)
 Last Fill: 02/11/2023  Eye exam: 08/26/2022 WNL   Labs: 12/19/2022 ALT 42  Next Visit: 09/22/2023  Last Visit: 04/22/2023  DX: MCTD (mixed connective tissue disease)   Current Dose per office note 04/22/2023: Plaquenil  200 mg 1 tablet by mouth twice daily Monday through Friday.   Okay to refill Plaquenil ?

## 2023-05-20 ENCOUNTER — Telehealth: Payer: Self-pay | Admitting: Rheumatology

## 2023-05-20 NOTE — Telephone Encounter (Signed)
 Patient called stating she is experiencing pain in both shoulders and left hip (sciatica).  Patient states the pain in her shoulders began last week and the left hip pain started  2 days ago.  Patient states the pain "came out of no where" and is not sure if she needs to schedule with Dr. Alvira Josephs or with her orthopedic doctor.  Patient requested a return call.

## 2023-05-20 NOTE — Telephone Encounter (Signed)
 Spoke with patient and she has been scheduled for 05/25/2023 at 8:30 am.

## 2023-05-20 NOTE — Telephone Encounter (Signed)
 If you have any open appointments please offer those options.  She may be able to get into see an orthopedic surgeon earlier.  Please check with the patient.

## 2023-05-25 ENCOUNTER — Ambulatory Visit: Admitting: Physician Assistant

## 2023-05-26 ENCOUNTER — Ambulatory Visit: Payer: Self-pay | Admitting: Family Medicine

## 2023-05-26 ENCOUNTER — Ambulatory Visit (INDEPENDENT_AMBULATORY_CARE_PROVIDER_SITE_OTHER): Admitting: Family Medicine

## 2023-05-26 ENCOUNTER — Encounter: Payer: Self-pay | Admitting: Family Medicine

## 2023-05-26 ENCOUNTER — Ambulatory Visit (HOSPITAL_BASED_OUTPATIENT_CLINIC_OR_DEPARTMENT_OTHER)
Admission: RE | Admit: 2023-05-26 | Discharge: 2023-05-26 | Disposition: A | Discharge status: Home / Self Care | Source: Ambulatory Visit | Attending: Family Medicine | Admitting: Family Medicine

## 2023-05-26 VITALS — BP 110/70 | HR 85 | Temp 98.3°F | Resp 18 | Ht 65.0 in | Wt 185.4 lb

## 2023-05-26 DIAGNOSIS — M778 Other enthesopathies, not elsewhere classified: Secondary | ICD-10-CM | POA: Diagnosis not present

## 2023-05-26 DIAGNOSIS — M19011 Primary osteoarthritis, right shoulder: Secondary | ICD-10-CM | POA: Diagnosis not present

## 2023-05-26 DIAGNOSIS — K2950 Unspecified chronic gastritis without bleeding: Secondary | ICD-10-CM | POA: Diagnosis not present

## 2023-05-26 DIAGNOSIS — M19012 Primary osteoarthritis, left shoulder: Secondary | ICD-10-CM

## 2023-05-26 DIAGNOSIS — M25512 Pain in left shoulder: Secondary | ICD-10-CM | POA: Diagnosis not present

## 2023-05-26 NOTE — Assessment & Plan Note (Signed)
 Con't pantoprazole  bid F/u GI  Went over egd results
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
## 2023-05-27 ENCOUNTER — Ambulatory Visit: Admitting: Sports Medicine

## 2023-05-27 ENCOUNTER — Encounter: Payer: Self-pay | Admitting: Sports Medicine

## 2023-05-27 VITALS — BP 118/70 | Ht 65.0 in | Wt 185.0 lb

## 2023-05-27 DIAGNOSIS — M25512 Pain in left shoulder: Secondary | ICD-10-CM

## 2023-05-27 MED ORDER — METHYLPREDNISOLONE ACETATE 40 MG/ML IJ SUSP
40.0000 mg | Freq: Once | INTRAMUSCULAR | Status: AC
  Administered 2023-05-27: 40 mg via INTRA_ARTICULAR

## 2023-05-27 NOTE — Progress Notes (Signed)
   Subjective:    Patient ID: Kimberly Escobar, female    DOB: [birth year], 68 y.o.   MRN: [id number]  HPI chief complaint: Left shoulder pain  Patient is a very pleasant right-hand-dominant 68 year old female that presents today with approximately 1 month of worsening left shoulder pain.  Pain is along the lateral shoulder and worse with abduction and external rotation.  She also experiences pain at night.  She takes diclofenac  for arthritis in other parts of her body but it has not helped her shoulder pain.  She saw her primary care physician recently and was referred to physical therapy.  X-rays of her shoulder done yesterday including AP and axillary views show a paucity of degenerative changes.  She denies any significant problems with her shoulder in the past.  She does have a history of cervical radiculopathy but this pain is different.  She has similar pain in the right shoulder but not as severe.  Past medical history reviewed Medications reviewed Allergies reviewed  Review of Systems As above    Objective:   Physical Exam  Well-developed, well-nourished.  No acute distress  Left shoulder: Good range of motion with a positive painful arc.  No tenderness to palpation.  Positive empty can, positive Hawkins.  Rotator cuff strength is 5/5 but she does have some reproducible pain with resisted supraspinatus.  Neurovascularly intact distally.  Left shoulder x-rays as above       Assessment & Plan:   Left shoulder pain secondary to rotator cuff tendinitis versus subacromial bursitis  Patient's left subacromial space is injected with cortisone today.  This was accomplished atraumatically under sterile technique.  She tolerated this without difficulty.  I have given her some home exercises to start with while she waits on her first physical therapy appointment.  If symptoms persist then we will need to consider merits of further diagnostic imaging.  She will follow-up with me for ongoing  or recalcitrant issues.  Consent obtained and verified. Time-out conducted. Noted no overlying erythema, induration, or other signs of local infection. Skin prepped in a sterile fashion. Topical analgesic spray: Ethyl chloride. Joint: Left shoulder, subacromial Needle: 25-gauge 1.5 inch Completed without difficulty. Meds: 3 cc 1% Xylocaine , 1 cc (40 mg) Depo-Medrol   This note was dictated using Dragon naturally speaking software and may contain errors in syntax, spelling, or content which have not been identified prior to signing this note.

## 2023-06-15 ENCOUNTER — Other Ambulatory Visit: Payer: Self-pay | Admitting: Family Medicine

## 2023-06-15 DIAGNOSIS — R42 Dizziness and giddiness: Secondary | ICD-10-CM

## 2023-06-16 ENCOUNTER — Ambulatory Visit: Attending: Family Medicine

## 2023-06-16 DIAGNOSIS — M25511 Pain in right shoulder: Secondary | ICD-10-CM | POA: Insufficient documentation

## 2023-06-16 DIAGNOSIS — M19012 Primary osteoarthritis, left shoulder: Secondary | ICD-10-CM | POA: Diagnosis not present

## 2023-06-16 DIAGNOSIS — M25512 Pain in left shoulder: Secondary | ICD-10-CM | POA: Diagnosis not present

## 2023-06-16 DIAGNOSIS — M19011 Primary osteoarthritis, right shoulder: Secondary | ICD-10-CM | POA: Diagnosis not present
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
## 2023-06-17 ENCOUNTER — Other Ambulatory Visit: Payer: Self-pay

## 2023-06-24 ENCOUNTER — Ambulatory Visit

## 2023-06-24 DIAGNOSIS — M25511 Pain in right shoulder: Secondary | ICD-10-CM
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
## 2023-06-30 ENCOUNTER — Other Ambulatory Visit: Payer: Self-pay

## 2023-06-30 ENCOUNTER — Ambulatory Visit

## 2023-06-30 ENCOUNTER — Other Ambulatory Visit: Payer: Self-pay | Admitting: Gastroenterology

## 2023-06-30 DIAGNOSIS — M25511 Pain in right shoulder: Secondary | ICD-10-CM | POA: Diagnosis not present
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
## 2023-07-07 ENCOUNTER — Other Ambulatory Visit (HOSPITAL_BASED_OUTPATIENT_CLINIC_OR_DEPARTMENT_OTHER): Payer: Self-pay | Admitting: Family Medicine

## 2023-07-07 ENCOUNTER — Ambulatory Visit

## 2023-07-07 DIAGNOSIS — Z1231 Encounter for screening mammogram for malignant neoplasm of breast: Secondary | ICD-10-CM

## 2023-07-07 DIAGNOSIS — M25511 Pain in right shoulder: Secondary | ICD-10-CM | POA: Diagnosis not present

## 2023-07-07 DIAGNOSIS — M25512 Pain in left shoulder: Secondary | ICD-10-CM
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
## 2023-07-08 ENCOUNTER — Other Ambulatory Visit: Payer: Self-pay | Admitting: Family Medicine

## 2023-07-08 DIAGNOSIS — R42 Dizziness and giddiness: Secondary | ICD-10-CM

## 2023-07-16 ENCOUNTER — Encounter

## 2023-07-20 ENCOUNTER — Ambulatory Visit: Payer: Self-pay | Admitting: Neurology

## 2023-07-20 ENCOUNTER — Telehealth: Payer: Self-pay | Admitting: Neurology

## 2023-07-20 ENCOUNTER — Encounter: Payer: Self-pay | Admitting: Neurology

## 2023-07-20 VITALS — Ht 65.0 in | Wt 182.0 lb

## 2023-07-20 DIAGNOSIS — R519 Headache, unspecified: Secondary | ICD-10-CM

## 2023-07-20 DIAGNOSIS — Z9189 Other specified personal risk factors, not elsewhere classified: Secondary | ICD-10-CM | POA: Diagnosis not present

## 2023-07-20 DIAGNOSIS — G43909 Migraine, unspecified, not intractable, without status migrainosus: Secondary | ICD-10-CM | POA: Diagnosis not present

## 2023-07-20 DIAGNOSIS — R42 Dizziness and giddiness: Secondary | ICD-10-CM | POA: Diagnosis not present

## 2023-07-20 DIAGNOSIS — R351 Nocturia: Secondary | ICD-10-CM
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
## 2023-07-20 NOTE — Telephone Encounter (Signed)
 no auth required sent to GI [phone]

## 2023-07-20 NOTE — Patient Instructions (Signed)
 It was nice to meet you today.   As discussed, your headaches are likely due to a combination of factors.   Here is what we discussed today and my recommendations for you:   Please remember, common headache triggers are: sleep deprivation, dehydration, overheating, stress, hypoglycemia or skipping meals and blood sugar fluctuations, excessive pain medications or excessive alcohol use or caffeine withdrawal. Some people have food triggers such as aged cheese, orange juice or chocolate, especially dark chocolate, or MSG (monosodium glutamate). Try to avoid these headache triggers as much possible. It may be helpful to keep a headache diary to figure out what makes your headaches worse or brings them on and what alleviates them. Some people report headache onset after exercise but studies have shown that regular exercise may actually prevent headaches from coming. If you have exercise-induced headaches, please make sure that you drink plenty of fluid before and after exercising and that you do not over do it and do not overheat. Please limit your caffeine to 1-2 servings/day, as caffeine can drive headaches.  Try to hydrate well, drink about 8 cups of water spread throughout the day, 8 ounce size each. We will do a brain scan, called MRI and call you with the test results. We will have to schedule you for this on a separate date. This test requires authorization from your insurance, and we will take care of the insurance process. I would recommend a sleep study to look for signs of obstructive sleep apnea as you may be at risk.  Let us  know if you change your mind.   With your physical in September I recommend to make sure that you have blood work to include vitamin D  level, vitamin B12, prediabetes check, and thyroid  function test.   For your headache you can continue Imitrex  as needed to your primary care.  I do not see any obvious neurological cause for dizziness but certain medications can increase your  risk for dizziness and these medications include tramadol , gabapentin , Flexeril , Zanaflex , and meclizine .   It may be worth seeing an ENT specialist again, talk to your primary care about a referral if needed.   So long as your brain MRI shows reassuring and age-appropriate findings, we can follow-up in this clinic as needed.

## 2023-07-21 ENCOUNTER — Ambulatory Visit: Attending: Family Medicine

## 2023-07-21 ENCOUNTER — Other Ambulatory Visit: Payer: Self-pay

## 2023-07-21 DIAGNOSIS — M25511 Pain in right shoulder: Secondary | ICD-10-CM | POA: Diagnosis not present

## 2023-07-21 DIAGNOSIS — M25512 Pain in left shoulder: Secondary | ICD-10-CM | POA: Diagnosis not present
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
## 2023-07-22 ENCOUNTER — Encounter: Payer: Self-pay | Admitting: Sports Medicine

## 2023-07-22 ENCOUNTER — Ambulatory Visit (INDEPENDENT_AMBULATORY_CARE_PROVIDER_SITE_OTHER): Admitting: Sports Medicine

## 2023-07-22 VITALS — BP 110/68 | Ht 62.0 in | Wt 182.0 lb

## 2023-07-22 DIAGNOSIS — M170 Bilateral primary osteoarthritis of knee: Secondary | ICD-10-CM | POA: Diagnosis not present

## 2023-07-22 DIAGNOSIS — M25512 Pain in left shoulder: Secondary | ICD-10-CM | POA: Diagnosis not present

## 2023-07-22 NOTE — Progress Notes (Signed)
   Subjective:    Patient ID: Kimberly Escobar, female    DOB: 05-Feb-1955, 68 y.o.   MRN: [id number]  HPI Patient presents today for follow-up on left shoulder pain.  It is doing much better.  Physical therapy has been very helpful.  The therapist has recommended that she continue twice a week for another month or so.  Her main complaint today is bilateral knee pain.  This is a chronic problem for her.  Pain is primarily in the anterior knee and is worse after prolonged sitting or going up stairs.  X-rays in 2023 showed moderate patellofemoral DJD.  She is interested in trying some physical therapy for her knee pain.   Review of Systems As above    Objective:   Physical Exam  Well-developed, well-nourished.  No acute distress  Left shoulder: Good range of motion.  No tenderness to palpation.  Good rotator cuff strength.  Examination of both knees shows good range of motion.  1+ boggy synovitis and 1+ patellofemoral crepitus.  No tenderness to palpation.  Grossly stable to ligamentous exam.  Neurovascularly intact distally.      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  She will wean to home exercise program per the therapist discretion and will follow-up with us  for ongoing or recalcitrant issues.  This note was dictated using Dragon naturally speaking software and may contain errors in syntax, spelling, or content which have not been identified prior to signing this note.

## 2023-08-01 ENCOUNTER — Other Ambulatory Visit: Payer: Self-pay | Admitting: Family Medicine

## 2023-08-01 DIAGNOSIS — R600 Localized edema: Secondary | ICD-10-CM

## 2023-08-15 ENCOUNTER — Ambulatory Visit
Admission: RE | Admit: 2023-08-15 | Discharge: 2023-08-15 | Disposition: A | Discharge status: Home / Self Care | Source: Ambulatory Visit | Attending: Neurology | Admitting: Neurology

## 2023-08-15 DIAGNOSIS — Z9189 Other specified personal risk factors, not elsewhere classified: Secondary | ICD-10-CM

## 2023-08-15 DIAGNOSIS — G43909 Migraine, unspecified, not intractable, without status migrainosus: Secondary | ICD-10-CM

## 2023-08-15 DIAGNOSIS — R42 Dizziness and giddiness: Secondary | ICD-10-CM

## 2023-08-15 DIAGNOSIS — R519 Headache, unspecified: Secondary | ICD-10-CM

## 2023-08-15 MED ORDER — GADOPICLENOL 0.5 MMOL/ML IV SOLN
8.0000 mL | Freq: Once | INTRAVENOUS | Status: AC | PRN
  Administered 2023-08-15: 8 mL via INTRAVENOUS

## 2023-08-16 ENCOUNTER — Encounter (HOSPITAL_BASED_OUTPATIENT_CLINIC_OR_DEPARTMENT_OTHER): Payer: Self-pay | Admitting: Emergency Medicine

## 2023-08-16 ENCOUNTER — Emergency Department (HOSPITAL_BASED_OUTPATIENT_CLINIC_OR_DEPARTMENT_OTHER)

## 2023-08-16 ENCOUNTER — Other Ambulatory Visit: Payer: Self-pay

## 2023-08-16 ENCOUNTER — Emergency Department (HOSPITAL_BASED_OUTPATIENT_CLINIC_OR_DEPARTMENT_OTHER)
Admission: EM | Admit: 2023-08-16 | Discharge: 2023-08-16 | Disposition: A | Discharge status: Home / Self Care | Attending: Emergency Medicine | Admitting: Emergency Medicine

## 2023-08-16 DIAGNOSIS — I10 Essential (primary) hypertension: Secondary | ICD-10-CM | POA: Insufficient documentation

## 2023-08-16 DIAGNOSIS — Z79899 Other long term (current) drug therapy: Secondary | ICD-10-CM | POA: Insufficient documentation

## 2023-08-16 DIAGNOSIS — R1032 Left lower quadrant pain: Secondary | ICD-10-CM | POA: Insufficient documentation

## 2023-08-16 DIAGNOSIS — K7689 Other specified diseases of liver: Secondary | ICD-10-CM | POA: Diagnosis not present

## 2023-08-16 LAB — CBC WITH DIFFERENTIAL/PLATELET
Abs Immature Granulocytes: 0.01 K/uL (ref 0.00–0.07)
Basophils Absolute: 0.1 K/uL (ref 0.0–0.1)
Basophils Relative: 1 %
Eosinophils Absolute: 0.1 K/uL (ref 0.0–0.5)
Eosinophils Relative: 2 %
HCT: 36.8 % (ref 36.0–46.0)
Hemoglobin: 12.6 g/dL (ref 12.0–15.0)
Immature Granulocytes: 0 %
Lymphocytes Relative: 40 %
Lymphs Abs: 2.1 K/uL (ref 0.7–4.0)
MCH: 31.8 pg (ref 26.0–34.0)
MCHC: 34.2 g/dL (ref 30.0–36.0)
MCV: 92.9 fL (ref 80.0–100.0)
Monocytes Absolute: 0.4 K/uL (ref 0.1–1.0)
Monocytes Relative: 8 %
Neutro Abs: 2.7 K/uL (ref 1.7–7.7)
Neutrophils Relative %: 49 %
Platelets: 264 K/uL (ref 150–400)
RBC: 3.96 MIL/uL (ref 3.87–5.11)
RDW: 12.7 % (ref 11.5–15.5)
WBC: 5.4 K/uL (ref 4.0–10.5)
nRBC: 0 % (ref 0.0–0.2)

## 2023-08-16 LAB — URINALYSIS, W/ REFLEX TO CULTURE (INFECTION SUSPECTED)
Bilirubin Urine: NEGATIVE
Glucose, UA: NEGATIVE mg/dL
Hgb urine dipstick: NEGATIVE
Ketones, ur: NEGATIVE mg/dL
Nitrite: NEGATIVE
Protein, ur: NEGATIVE mg/dL
RBC / HPF: NONE SEEN RBC/hpf (ref 0–5)
Specific Gravity, Urine: 1.01 (ref 1.005–1.030)
pH: 5.5 (ref 5.0–8.0)

## 2023-08-16 LAB — BASIC METABOLIC PANEL WITH GFR
Anion gap: 12 (ref 5–15)
BUN: 17 mg/dL (ref 8–23)
CO2: 24 mmol/L (ref 22–32)
Calcium: 9.5 mg/dL (ref 8.9–10.3)
Chloride: 107 mmol/L (ref 98–111)
Creatinine, Ser: 0.93 mg/dL (ref 0.44–1.00)
GFR, Estimated: >60 mL/min (ref >60)
Glucose, Bld: 85 mg/dL (ref 70–99)
Potassium: 3.7 mmol/L (ref 3.5–5.1)
Sodium: 143 mmol/L (ref 135–145)

## 2023-08-16 MED ORDER — FENTANYL CITRATE PF 50 MCG/ML IJ SOSY
50.0000 ug | PREFILLED_SYRINGE | Freq: Once | INTRAMUSCULAR | Status: AC
  Administered 2023-08-16: 50 ug via INTRAVENOUS
  Filled 2023-08-16: qty 1

## 2023-08-16 MED ORDER — ONDANSETRON HCL 4 MG/2ML IJ SOLN
4.0000 mg | Freq: Once | INTRAMUSCULAR | Status: AC
  Administered 2023-08-16: 4 mg via INTRAVENOUS
  Filled 2023-08-16: qty 2

## 2023-08-16 NOTE — ED Triage Notes (Signed)
 Pt c/o sharp, intermittent LLQ pain that started this morning; sts she spit up a couple of times
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
## 2023-08-16 NOTE — Discharge Instructions (Addendum)
 Your workup was reassuring today.  No clear cause of the pain.  There was rare bacteria in the urine.  If you develop urinary symptoms we would be more worried about an infection.  Follow-up with your Doctor.

## 2023-08-16 NOTE — ED Notes (Signed)
 Patient transported to CT

## 2023-08-16 NOTE — ED Notes (Signed)
 Pt alert and oriented X 4 at the time of discharge. RR even and unlabored. No acute distress noted. Pt verbalized understanding of discharge instructions as discussed. Pt ambulatory to lobby at time of discharge.

## 2023-08-18 ENCOUNTER — Ambulatory Visit: Attending: Family Medicine | Admitting: Rehabilitation

## 2023-08-18 ENCOUNTER — Ambulatory Visit: Payer: Self-pay | Admitting: Neurology

## 2023-08-18 ENCOUNTER — Encounter: Payer: Self-pay | Admitting: Rehabilitation

## 2023-08-18 DIAGNOSIS — M25512 Pain in left shoulder: Secondary | ICD-10-CM | POA: Insufficient documentation

## 2023-08-18 DIAGNOSIS — M25511 Pain in right shoulder: Secondary | ICD-10-CM | POA: Insufficient documentation
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
## 2023-08-18 NOTE — Telephone Encounter (Signed)
 Noted  Last read by Layton VEAR Daring at 8:20AM on 08/18/2023.
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
## 2023-08-21 ENCOUNTER — Other Ambulatory Visit: Payer: Self-pay | Admitting: Rheumatology

## 2023-08-21 DIAGNOSIS — M351 Other overlap syndromes: Secondary | ICD-10-CM

## 2023-08-21 NOTE — Telephone Encounter (Addendum)
 Last Fill: 05/18/2023  Eye exam: 08/26/2022 WNL,I called Patient she will schedule Eye exam.LS 08/21/2023  Labs: 08/16/2023 CBC w/ Diff WNL BMP WNL  Next Visit: 09/22/2023  Last Visit: 04/22/2023  IK:FRUI (mixed connective tissue disease)  Current Dose per office note 04/22/2023: Plaquenil  200 mg 1 tablet by mouth twice daily Monday through Friday.   Okay to refill Plaquenil ?

## 2023-08-31 ENCOUNTER — Other Ambulatory Visit: Payer: Self-pay

## 2023-08-31 ENCOUNTER — Ambulatory Visit: Admitting: Family Medicine

## 2023-08-31 ENCOUNTER — Encounter: Payer: Self-pay | Admitting: Family Medicine

## 2023-08-31 VITALS — BP 135/72 | Ht 65.0 in | Wt 178.0 lb

## 2023-08-31 DIAGNOSIS — M75112 Incomplete rotator cuff tear or rupture of left shoulder, not specified as traumatic: Secondary | ICD-10-CM | POA: Diagnosis not present

## 2023-08-31 DIAGNOSIS — M25512 Pain in left shoulder: Secondary | ICD-10-CM

## 2023-08-31 DIAGNOSIS — M5412 Radiculopathy, cervical region: Secondary | ICD-10-CM

## 2023-08-31 DIAGNOSIS — M67912 Unspecified disorder of synovium and tendon, left shoulder: Secondary | ICD-10-CM | POA: Diagnosis not present

## 2023-08-31 MED ORDER — PREDNISONE 10 MG PO TABS
ORAL_TABLET | ORAL | 0 refills | Status: DC
Start: 2023-08-31 — End: 2023-09-22

## 2023-08-31 NOTE — Patient Instructions (Signed)
 Samatha from [employer] will be call you to get you an appt for tomorrow.  The are located inside the [employer] in [place]

## 2023-08-31 NOTE — Progress Notes (Addendum)
    SUBJECTIVE:   CHIEF COMPLAINT / HPI:   This is a pleasant 68 year old female patient presenting with onset of left shoulder pain following her final round of physical therapy.  She reports that she had a new physical therapist on that day and she had gone through all of her prior exercises and was feeling fine and at the end he had her lay down on the table and had been manipulating her arm.  She cannot recall whether he was pushing or pulling but she does recall an intense pressure in the arm that was not immediately painful but was different.  The following day after she woke up she reports intense pain in the left shoulder and she has had difficulty lifting the arm above her head.  She also reports constant dull aching pain along the posterior aspect of her entire arm and into digits 3 4 and 5.  PERTINENT  PMH / PSH: Acute pain of the left shoulder  OBJECTIVE:   BP 135/72   Ht 5' 5 (1.651 m)   Wt 178 lb (80.7 kg)   BMI 29.62 kg/m   General: Well-appearing female, no distress MSK:  SHOULDER:  Lt shoulder with restricted range of motion in abduction past 90 degrees on the left.  Painful passive range of motion past 90 degrees.  Positive empty can sign as well as Neer sign.  Tender to palpation over the subacromial space anteriorly.  5-/5 strength in bicep flexion, internal rotation and external rotation.  Tender to palpation over the entire posterior aspect of the upper arm and forearm. Skin: No bruising, erythema or swelling noted.  No rash N/V: Neurovascularly intact  IMAGING: Complete MSK ultrasound left shoulder Date: 08/31/2023 Indication: Left shoulder pain Findings: - Biceps tendon with hypoechoic changes with slight partial tearing, no evidence of full-thickness tear.  Seen in long and short axis views - Subscapularis tendon with hypoechoic changes seen at the insertional footplate.  Associated calcifications and surrounding partial-thickness tearing.  Does have a rim of  hyperechoic changes suggestive of scarring and remodeling/healing.  No significant increased Doppler flow - Supraspinatus tendon with hypoechoic change.  Does have articular sided partial tear, some surrounding hyperechoic changes consistent with scarring.  No increased Doppler flow.  Well-visualized in long and short axis views - Infraspinatus and teres minor tendons smooth and without obvious defect. - Mild arthritic changes of the [facility] joint with cortical irregularity in the joint space - Glenohumeral joint is smooth and without evidence of labral tear or other arthritic changes.  ASSESSMENT/PLAN:   Assessment & Plan Tendinopathy of rotator cuff, left - Likely related to exacerbation of previous injury, possible stretching of scar tissue -Recommend follow-up with physical therapy for ongoing treatment -Steroid taper x 6 days, patient will follow-up after completing therapy. -Consider further intervention if pain does not improve -Follow-up in 6 weeks or sooner if needed Incomplete tear of left rotator cuff, unspecified whether traumatic  Left cervical radiculopathy    Lucie Pinal, DO [facility]

## 2023-08-31 NOTE — Assessment & Plan Note (Signed)
-   Likely related to exacerbation of previous injury, possible stretching of scar tissue -Recommend follow-up with physical therapy for ongoing treatment -Steroid taper x 6 days, patient will follow-up after completing therapy. -Consider further intervention if pain does not improve -Follow-up in 6 weeks or sooner if needed

## 2023-08-31 NOTE — Progress Notes (Signed)
 Will send PT referral to [employer]

## 2023-09-01 ENCOUNTER — Ambulatory Visit: Admitting: Rehabilitation

## 2023-09-01 ENCOUNTER — Ambulatory Visit: Admitting: Family Medicine

## 2023-09-02 DIAGNOSIS — M542 Cervicalgia: Secondary | ICD-10-CM | POA: Diagnosis not present

## 2023-09-02 DIAGNOSIS — M25519 Pain in unspecified shoulder: Secondary | ICD-10-CM | POA: Diagnosis not present

## 2023-09-03 ENCOUNTER — Ambulatory Visit

## 2023-09-05 ENCOUNTER — Other Ambulatory Visit: Payer: Self-pay | Admitting: Gastroenterology

## 2023-09-07 DIAGNOSIS — M542 Cervicalgia: Secondary | ICD-10-CM | POA: Diagnosis not present

## 2023-09-07 DIAGNOSIS — M25519 Pain in unspecified shoulder: Secondary | ICD-10-CM | POA: Diagnosis not present

## 2023-09-08 ENCOUNTER — Ambulatory Visit (INDEPENDENT_AMBULATORY_CARE_PROVIDER_SITE_OTHER)

## 2023-09-08 VITALS — Ht 65.0 in | Wt 178.0 lb

## 2023-09-08 DIAGNOSIS — Z0000 Encounter for general adult medical examination without abnormal findings: Secondary | ICD-10-CM | POA: Diagnosis not present
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
## 2023-09-08 NOTE — Patient Instructions (Signed)
 Kimberly Escobar , Thank you for taking time out of your busy schedule to complete your Annual Wellness Visit with me. I enjoyed our conversation and look forward to speaking with you again next year. I, as well as your care team,  appreciate your ongoing commitment to your health goals. Please review the following plan we discussed and let me know if I can assist you in the future. Your Game plan/ To Do List     Follow up Visits: We will see or speak with you next year for your Next Medicare AWV with our clinical staff Have you seen your provider in the last 6 months (3 months if uncontrolled diabetes)? No  Clinician Recommendations:  Aim for 30 minutes of exercise or brisk walking, 6-8 glasses of water, and 5 servings of fruits and vegetables each day.       This is a list of the screenings recommended for you:  Health Maintenance  Topic Date Due   Flu Shot  08/14/2023   COVID-19 Vaccine (6 - Mixed Product risk 2024-25 season) 07/07/2024*   Mammogram  10/27/2023   Cologuard (Stool DNA test)  06/17/2024   Medicare Annual Wellness Visit  09/07/2024   DTaP/Tdap/Td vaccine (3 - Td or Tdap) 12/07/2032   Pneumococcal Vaccine for age over 39  Completed   DEXA scan (bone density measurement)  Completed   Hepatitis C Screening  Completed   Zoster (Shingles) Vaccine  Completed   HPV Vaccine  Aged Out   Meningitis B Vaccine  Aged Out   Colon Cancer Screening  Discontinued  *Topic was postponed. The date shown is not the original due date.    Advanced directives: (ACP Link)Information on Advanced Care Planning can be found at [place]  Secretary of [facility] Advance Health Care Directives Advance Health Care Directives. [email or web address]   Advance Care Planning is important because it:  [x]  Makes sure you receive the medical care that is consistent with your values, goals, and preferences  [x]  It provides guidance to your family and loved ones and reduces their decisional burden about whether or not  they are making the right decisions based on your wishes.  Follow the link provided in your after visit summary or read over the paperwork we have mailed to you to help you started getting your Advance Directives in place. If you need assistance in completing these, please reach out to us  so that we can help you!  See attachments for Preventive Care and Fall Prevention Tips.
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
## 2023-09-22 ENCOUNTER — Telehealth: Payer: Self-pay | Admitting: Family Medicine

## 2023-09-22 ENCOUNTER — Other Ambulatory Visit: Payer: Self-pay

## 2023-09-22 ENCOUNTER — Encounter: Payer: Self-pay | Admitting: Physician Assistant

## 2023-09-22 ENCOUNTER — Ambulatory Visit: Attending: Physician Assistant | Admitting: Physician Assistant

## 2023-09-22 VITALS — BP 120/77 | HR 69 | Temp 97.8°F | Resp 13 | Ht 65.0 in | Wt 178.0 lb

## 2023-09-22 DIAGNOSIS — M797 Fibromyalgia: Secondary | ICD-10-CM | POA: Diagnosis not present

## 2023-09-22 DIAGNOSIS — M170 Bilateral primary osteoarthritis of knee: Secondary | ICD-10-CM

## 2023-09-22 DIAGNOSIS — Z8719 Personal history of other diseases of the digestive system: Secondary | ICD-10-CM

## 2023-09-22 DIAGNOSIS — G8929 Other chronic pain: Secondary | ICD-10-CM

## 2023-09-22 DIAGNOSIS — M351 Other overlap syndromes: Secondary | ICD-10-CM | POA: Diagnosis not present

## 2023-09-22 DIAGNOSIS — M19041 Primary osteoarthritis, right hand: Secondary | ICD-10-CM

## 2023-09-22 DIAGNOSIS — M19042 Primary osteoarthritis, left hand: Secondary | ICD-10-CM

## 2023-09-22 DIAGNOSIS — E785 Hyperlipidemia, unspecified: Secondary | ICD-10-CM

## 2023-09-22 DIAGNOSIS — M7062 Trochanteric bursitis, left hip: Secondary | ICD-10-CM

## 2023-09-22 DIAGNOSIS — M533 Sacrococcygeal disorders, not elsewhere classified: Secondary | ICD-10-CM | POA: Diagnosis not present

## 2023-09-22 DIAGNOSIS — M8589 Other specified disorders of bone density and structure, multiple sites: Secondary | ICD-10-CM | POA: Diagnosis not present

## 2023-09-22 DIAGNOSIS — I872 Venous insufficiency (chronic) (peripheral): Secondary | ICD-10-CM

## 2023-09-22 DIAGNOSIS — Z79899 Other long term (current) drug therapy: Secondary | ICD-10-CM

## 2023-09-22 DIAGNOSIS — M5030 Other cervical disc degeneration, unspecified cervical region: Secondary | ICD-10-CM | POA: Diagnosis not present

## 2023-09-22 DIAGNOSIS — Z8669 Personal history of other diseases of the nervous system and sense organs: Secondary | ICD-10-CM

## 2023-09-22 DIAGNOSIS — M5412 Radiculopathy, cervical region: Secondary | ICD-10-CM

## 2023-09-22 DIAGNOSIS — G4709 Other insomnia: Secondary | ICD-10-CM | POA: Diagnosis not present

## 2023-09-22 DIAGNOSIS — Z9889 Other specified postprocedural states: Secondary | ICD-10-CM | POA: Diagnosis not present

## 2023-09-22 DIAGNOSIS — M25512 Pain in left shoulder: Secondary | ICD-10-CM

## 2023-09-22 MED ORDER — PREDNISONE 10 MG PO TABS: ORAL_TABLET | ORAL | 0 refills | Status: DC

## 2023-09-22 NOTE — Telephone Encounter (Signed)
-----   Message from [facility] sent at 09/22/2023  1:41 PM EDT ----- Regarding: FW: phone message  ----- Message ----- From: Lera Andrea CROME Sent: 09/22/2023   1:38 PM EDT To: Duwaine Bolognese, LAT Subject: phone message                                  Pt is still having shoulder/elbow pain and is asking for a refill on prednisone  or something to help with pain.

## 2023-09-22 NOTE — Telephone Encounter (Signed)
 Spoke to patient on the phone 09/22/2023 at 1405 EST.  Called patient to discuss message about ongoing shoulder and elbow pain.  Last seen by me 08/31/2023, diagnosed with cervical radiculopathy and left rotator cuff tendinopathy.  Was given Medrol  Dosepak to take for 6 days.  She states this was quite helpful, but symptoms increased after completing the course.  She has tried using Salonpas lidocaine  patches, ibuprofen, Tylenol with limited improvement.  Describes pain rating from her left side of the neck down into the left upper arm, elbow and sometimes even radiating to the left hand/fifth finger.  Denies any new injury or trauma.  Does have history of prior epidural injection several years ago which was helpful.  Last MRI of the neck was in 2021.  Assessment/plan: 1. Left cervical radiculopathy Based on discussion with patient suspect worsening left-sided cervical radiculopathy.  She has not had any recent imaging, although has done well with prior epidural injections  Plan: - Will prescribe prednisone  Dosepak to take as directed over the next 12 days - Should avoid any NSAIDs while taking the prednisone  - Will order updated MRI cervical spine without contrast to rule out pinched nerve or worsening disease from prior MRI in 2021.  Could consider referral to surgeon or for epidural pending MRI results - Patient currently scheduled for appointment with me on 09/28/2023, instructed to reschedule to the end of the month after I return from my vacation on 10/12/2023 for the first week of October.  Patient expressed understanding agree with above.  All questions were answered

## 2023-09-23 ENCOUNTER — Ambulatory Visit: Payer: Self-pay | Admitting: Physician Assistant

## 2023-09-23 NOTE — Progress Notes (Signed)
 CBC and CMP WNL ESR WNL Urine protein creatinine ratio WNL

## 2023-09-23 NOTE — Progress Notes (Signed)
Complements WNL

## 2023-09-24 LAB — COMPREHENSIVE METABOLIC PANEL WITH GFR
AG Ratio: 1.6 (calc) (ref 1.0–2.5)
ALT: 24 U/L (ref 6–29)
AST: 14 U/L (ref 10–35)
Albumin: 4.5 g/dL (ref 3.6–5.1)
Alkaline phosphatase (APISO): 81 U/L (ref 37–153)
BUN: 21 mg/dL (ref 7–25)
CO2: 29 mmol/L (ref 20–32)
Calcium: 10.2 mg/dL (ref 8.6–10.4)
Chloride: 107 mmol/L (ref 98–110)
Creat: 0.78 mg/dL (ref 0.60–1.00)
Globulin: 2.9 g/dL (ref 1.9–3.7)
Glucose, Bld: 87 mg/dL (ref 65–99)
Potassium: 4.2 mmol/L (ref 3.5–5.3)
Sodium: 143 mmol/L (ref 135–146)
Total Bilirubin: 0.5 mg/dL (ref 0.2–1.2)
Total Protein: 7.4 g/dL (ref 6.1–8.1)
eGFR: 82 mL/min/1.73m2 (ref >=60)

## 2023-09-24 LAB — CBC WITH DIFFERENTIAL/PLATELET
Absolute Lymphocytes: 1546 {cells}/uL (ref 850–3900)
Absolute Monocytes: 349 {cells}/uL (ref 200–950)
Basophils Absolute: 42 {cells}/uL (ref 0–200)
Basophils Relative: 1 %
Eosinophils Absolute: 88 {cells}/uL (ref 15–500)
Eosinophils Relative: 2.1 %
HCT: 39.7 % (ref 35.0–45.0)
Hemoglobin: 12.7 g/dL (ref 11.7–15.5)
MCH: 31.5 pg (ref 27.0–33.0)
MCHC: 32 g/dL (ref 32.0–36.0)
MCV: 98.5 fL (ref 80.0–100.0)
MPV: 9.5 fL (ref 7.5–12.5)
Monocytes Relative: 8.3 %
Neutro Abs: 2176 {cells}/uL (ref 1500–7800)
Neutrophils Relative %: 51.8 %
Platelets: 258 Thousand/uL (ref 140–400)
RBC: 4.03 Million/uL (ref 3.80–5.10)
RDW: 12.5 % (ref 11.0–15.0)
Total Lymphocyte: 36.8 %
WBC: 4.2 Thousand/uL (ref 3.8–10.8)

## 2023-09-24 LAB — ANTI-NUCLEAR AB-TITER (ANA TITER)
ANA TITER: 1:40 {titer} — ABNORMAL HIGH
ANA Titer 1: 1:40 {titer} — ABNORMAL HIGH

## 2023-09-24 LAB — RNP ANTIBODY: Ribonucleic Protein(ENA) Antibody, IgG: 1.1 AI — AB

## 2023-09-24 LAB — ANTI-SMITH ANTIBODY: ENA SM Ab Ser-aCnc: 2.4 AI — AB

## 2023-09-24 LAB — PROTEIN / CREATININE RATIO, URINE
Creatinine, Urine: 136 mg/dL (ref 20–275)
Protein/Creat Ratio: 88 mg/g{creat} (ref 24–184)
Protein/Creatinine Ratio: 0.088 mg/mg{creat} (ref 0.024–0.184)
Total Protein, Urine: 12 mg/dL (ref 5–24)

## 2023-09-24 LAB — ANA: Anti Nuclear Antibody (ANA): POSITIVE — AB

## 2023-09-24 LAB — C3 AND C4
C3 Complement: 157 mg/dL (ref 83–193)
C4 Complement: 24 mg/dL (ref 15–57)

## 2023-09-24 LAB — ANTI-DNA ANTIBODY, DOUBLE-STRANDED: ds DNA Ab: <1 [IU]/mL

## 2023-09-24 LAB — SEDIMENTATION RATE: Sed Rate: 9 mm/h (ref 0–30)

## 2023-09-24 NOTE — Progress Notes (Signed)
 ANA is low titer positive and stable, Smith and RNP antibodies remain positive, double-stranded DNA negative, urine protein creatinine ratio normal, sed rate normal, CBC and CMP normal.  Complements normal.  Labs do not indicate an autoimmune disease flare.

## 2023-09-24 NOTE — Progress Notes (Signed)
 ANA pending, double-stranded DNA negative, sed rate normal, complements normal, RNP remains positive, Smith antibody remains positive, urine protein creatinine ratio normal, CBC and CMP normal.  ANA pending.  Labs do not indicate an autoimmune disease flare.  No change in treatment advised.

## 2023-09-27 ENCOUNTER — Other Ambulatory Visit (HOSPITAL_BASED_OUTPATIENT_CLINIC_OR_DEPARTMENT_OTHER)

## 2023-09-28 ENCOUNTER — Ambulatory Visit: Admitting: Family Medicine

## 2023-09-29 ENCOUNTER — Ambulatory Visit (HOSPITAL_BASED_OUTPATIENT_CLINIC_OR_DEPARTMENT_OTHER)
Admission: RE | Admit: 2023-09-29 | Discharge: 2023-09-29 | Disposition: A | Discharge status: Home / Self Care | Source: Ambulatory Visit | Attending: Family Medicine | Admitting: Family Medicine

## 2023-09-29 DIAGNOSIS — M4802 Spinal stenosis, cervical region: Secondary | ICD-10-CM | POA: Diagnosis not present

## 2023-09-29 DIAGNOSIS — M50123 Cervical disc disorder at C6-C7 level with radiculopathy: Secondary | ICD-10-CM | POA: Diagnosis not present

## 2023-09-29 DIAGNOSIS — M5412 Radiculopathy, cervical region: Secondary | ICD-10-CM | POA: Insufficient documentation

## 2023-09-29 DIAGNOSIS — M4803 Spinal stenosis, cervicothoracic region: Secondary | ICD-10-CM | POA: Diagnosis not present

## 2023-10-04 DIAGNOSIS — M25519 Pain in unspecified shoulder: Secondary | ICD-10-CM | POA: Diagnosis not present

## 2023-10-08 ENCOUNTER — Ambulatory Visit: Payer: Self-pay | Admitting: Family Medicine

## 2023-10-08 ENCOUNTER — Other Ambulatory Visit (HOSPITAL_BASED_OUTPATIENT_CLINIC_OR_DEPARTMENT_OTHER)

## 2023-10-13 ENCOUNTER — Encounter: Payer: Self-pay | Admitting: Family Medicine

## 2023-10-13 ENCOUNTER — Ambulatory Visit (INDEPENDENT_AMBULATORY_CARE_PROVIDER_SITE_OTHER): Admitting: Family Medicine

## 2023-10-13 VITALS — BP 138/67 | Ht 65.0 in | Wt 178.0 lb

## 2023-10-13 DIAGNOSIS — M67912 Unspecified disorder of synovium and tendon, left shoulder: Secondary | ICD-10-CM

## 2023-10-13 DIAGNOSIS — M5412 Radiculopathy, cervical region: Secondary | ICD-10-CM | POA: Diagnosis not present

## 2023-10-13 NOTE — Progress Notes (Signed)
 DATE OF VISIT: 10/13/2023        Kimberly Escobar DOB: [birth year] MRN: [id number]  CC:  f/u Lt shoulder & Lt-sided cervical radiculopathy  History of present Illness: Kimberly Escobar is a 68 y.o. female who presents for a follow-up visit for Lt shoulder pain Last seen by me 08/31/23 for left rotator cuff tendinosis and partial rotator cuff tear - Was given a Medrol  Dosepak and referred to PT Stop by phone 09/22/2023 due to worsening symptoms - Was given 12-day prednisone  taper and an order for MRI cervical spine was placed Had MRI of the cervical spine 09/29/2023 with findings as noted below  Today she reports feeling about 90% improved Having occasional numbness and tingling along the left fifth finger Denies any weakness Now has full range of motion of the shoulder without pain Not taking anything for pain at a regular basis Will use occasional tizanidine  at bedtime Has not been doing any PT or home exercises at this time  Medications:  Outpatient Encounter Medications as of 10/13/2023  Medication Sig   acyclovir  (ZOVIRAX ) 400 MG tablet Take 1 tablet (400 mg total) by mouth daily as needed.   acyclovir  ointment (ZOVIRAX ) 5 % Apply topically 3 hours as needed   cholecalciferol (VITAMIN D3) 25 MCG (1000 UNIT) tablet Take 1,000 Units by mouth daily.   cyclobenzaprine  (FLEXERIL ) 5 MG tablet Take 1 tablet (5 mg total) by mouth 3 (three) times daily as needed for muscle spasms.   diclofenac  (VOLTAREN ) 75 MG EC tablet Take 1 tablet by mouth twice daily with food   fluticasone  (FLONASE ) 50 MCG/ACT nasal spray SHAKE LIQUID AND USE 2 SPRAYS IN EACH NOSTRIL DAILY   furosemide  (LASIX ) 40 MG tablet Take 1 tablet by mouth once daily   gabapentin  (NEURONTIN ) 100 MG capsule Take by mouth as needed.   hydroxychloroquine  (PLAQUENIL ) 200 MG tablet TAKE 1 TABLET BY MOUTH TWICE DAILY MONDAY THROUGH FRIDAY   loratadine  (CLARITIN ) 10 MG tablet Take 1 tablet (10 mg total) by mouth daily.   meclizine  (ANTIVERT )  25 MG tablet TAKE 1 TABLET BY MOUTH THREE TIMES DAILY AS NEEDED FOR DIZZINESS (Patient not taking: Reported on 09/22/2023)   Multiple Vitamins-Minerals (CENTRUM SILVER 50+WOMEN PO) Take by mouth daily.   NONFORMULARY OR COMPOUNDED ITEM Compression stockings  20-30 mm/ hg   dx varicose veins   OMEGA-3 FATTY ACIDS PO Take by mouth as needed.   pantoprazole  (PROTONIX ) 40 MG tablet Take 1 tablet by mouth twice daily   predniSONE  (DELTASONE ) 10 MG tablet Days 1-4: take 6 tabs by mouth daily, days 5-8: take 4 tabs by mouth daily, days 9-12: take 2 tabs by mouth daily   tiZANidine  (ZANAFLEX ) 4 MG tablet Take 1 tablet (4 mg total) by mouth every 6 (six) hours as needed for muscle spasms. (Patient not taking: Reported on 09/22/2023)   traMADol  (ULTRAM ) 50 MG tablet Take 1 tablet (50 mg total) by mouth every 6 (six) hours as needed.   TURMERIC CURCUMIN PO Take 1 capsule by mouth daily.   Tyrosine  500 MG CAPS Take 1 capsule (500 mg total) by mouth daily. (Patient not taking: Reported on 09/22/2023)   No facility-administered encounter medications on file as of 10/13/2023.    Allergies: is allergic to codeine.  Physical Examination: Vitals: BP 138/67   Ht 5' 5 (1.651 m)   Wt 178 lb (80.7 kg)   BMI 29.62 kg/m  GENERAL:  Kimberly Escobar is a 68 y.o. female appearing their stated age,  alert and oriented x 3, in no apparent distress.  SKIN: no rashes or lesions, skin clean, dry, intact MSK: C-spine with full range of motion without pain.  No midline or paraspinal tenderness.  Negative Spurling's bilaterally Shoulder: Bilateral shoulders with full range of motion without pain.  Negative Hawkins, negative Neer, negative empty can, negative speeds.  Rotator cuff strength 5/5 throughout.  Normal grip strength bilaterally NEURO: sensation intact to light touch upper extremity bilaterally, DTR 2/4 bicep, tricep, brachial radialis bilaterally VASC: pulses 2+ and symmetric radial bilaterally, no  edema  Radiology: MRI cervical spine without contrast 09/29/2023 showing: FINDINGS:  BONES AND ALIGNMENT: Straightening of the normal cervical lordosis.   SPINAL CORD: The central canal is mildly prominent at the C3 and C4 vertebral body levels, unchanged in the interim.   SOFT TISSUES: No paraspinal mass.   C2-C3: No significant disc herniation. No spinal canal stenosis or neural foraminal narrowing.   C3-C4: No significant disc herniation. Mildly prominent central canal.   C4-C5: No significant disc herniation. No spinal canal stenosis or neural foraminal narrowing.   C5-C6: Disc space narrowing and mild diffuse endplate ridging with mild central spinal canal stenosis and mild bilateral neural foraminal stenosis.   C6-C7: Small central disc protrusion with mild central spinal canal stenosis and mild right neural foraminal stenosis.   C7-T1: Moderate left-sided uncovertebral joint hypertrophy causing severe left neural foraminal stenosis and likely impingement of the left C8 nerve.   IMPRESSION: 1. 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 2. Mild central spinal canal stenosis and mild bilateral neural foraminal stenosis at C5-6. 3. Mild central spinal canal stenosis and mild right neural foraminal stenosis at C6-7.  Assessment & Plan Left cervical radiculopathy Left-sided cervical radiculopathy which is currently stable, has intermittent numbness and tingling into the left fifth finger along the C8 nerve root distribution which correlates with her MRI findings  Plan: - MRI cervical spine reviewed with patient, images reviewed with her today - She can continue her home exercises as directed - Since she is relatively symptom-free will continue to monitor.  If she has any worsening symptoms in the future including increased pain, increased numbness tingling, or associated weakness would consider  possible epidural injection or referral to a surgeon - Otherwise she will follow-up with me on an as-needed basis.  Encouraged to reach out with any questions or concerns Tendinopathy of rotator cuff, left Left rotator cuff tendinopathy greatly improved, feeling 90% improved, has excellent range of motion and excellent strength today  Plan: - Can continue her home exercises as directed - Will follow-up if symptoms worsening, otherwise as needed   Patient expressed understanding & agreement with above.  Encounter Diagnoses  Name Primary?   Left cervical radiculopathy Yes   Tendinopathy of rotator cuff, left     No orders of the defined types were placed in this encounter.

## 2023-10-13 NOTE — Progress Notes (Signed)
 Reviewed with patient during visit 10/13/23.  See Progress Note from that visit for further details.

## 2023-10-13 NOTE — Assessment & Plan Note (Signed)
 Left-sided cervical radiculopathy which is currently stable, has intermittent numbness and tingling into the left fifth finger along the C8 nerve root distribution which correlates with her MRI findings  Plan: - MRI cervical spine reviewed with patient, images reviewed with her today - She can continue her home exercises as directed - Since she is relatively symptom-free will continue to monitor.  If she has any worsening symptoms in the future including increased pain, increased numbness tingling, or associated weakness would consider possible epidural injection or referral to a surgeon - Otherwise she will follow-up with me on an as-needed basis.  Encouraged to reach out with any questions or concerns

## 2023-11-02 ENCOUNTER — Ambulatory Visit (HOSPITAL_BASED_OUTPATIENT_CLINIC_OR_DEPARTMENT_OTHER)
Admission: RE | Admit: 2023-11-02 | Discharge: 2023-11-02 | Disposition: A | Discharge status: Home / Self Care | Source: Ambulatory Visit | Attending: Obstetrics & Gynecology | Admitting: Obstetrics & Gynecology

## 2023-11-02 ENCOUNTER — Encounter (HOSPITAL_BASED_OUTPATIENT_CLINIC_OR_DEPARTMENT_OTHER): Payer: Self-pay

## 2023-11-02 ENCOUNTER — Ambulatory Visit (HOSPITAL_BASED_OUTPATIENT_CLINIC_OR_DEPARTMENT_OTHER)
Admission: RE | Admit: 2023-11-02 | Discharge: 2023-11-02 | Disposition: A | Discharge status: Home / Self Care | Source: Ambulatory Visit | Attending: Family Medicine | Admitting: Family Medicine

## 2023-11-02 DIAGNOSIS — Z780 Asymptomatic menopausal state: Secondary | ICD-10-CM | POA: Insufficient documentation

## 2023-11-02 DIAGNOSIS — M8580 Other specified disorders of bone density and structure, unspecified site: Secondary | ICD-10-CM | POA: Diagnosis not present

## 2023-11-02 DIAGNOSIS — Z1231 Encounter for screening mammogram for malignant neoplasm of breast: Secondary | ICD-10-CM | POA: Insufficient documentation

## 2023-11-02 DIAGNOSIS — M8589 Other specified disorders of bone density and structure, multiple sites: Secondary | ICD-10-CM | POA: Diagnosis not present

## 2023-11-05 DIAGNOSIS — Z79899 Other long term (current) drug therapy: Secondary | ICD-10-CM | POA: Diagnosis not present

## 2023-11-05 DIAGNOSIS — H25813 Combined forms of age-related cataract, bilateral: Secondary | ICD-10-CM | POA: Diagnosis not present

## 2023-11-05 DIAGNOSIS — H40053 Ocular hypertension, bilateral: Secondary | ICD-10-CM | POA: Diagnosis not present

## 2023-11-05 DIAGNOSIS — H43822 Vitreomacular adhesion, left eye: Secondary | ICD-10-CM | POA: Diagnosis not present

## 2023-11-09 ENCOUNTER — Ambulatory Visit: Admitting: Family Medicine

## 2023-11-09 ENCOUNTER — Encounter: Payer: Self-pay | Admitting: Family Medicine

## 2023-11-09 VITALS — BP 126/74 | Ht 64.5 in | Wt 182.0 lb

## 2023-11-09 DIAGNOSIS — M170 Bilateral primary osteoarthritis of knee: Secondary | ICD-10-CM | POA: Diagnosis not present

## 2023-11-09 MED ORDER — METHYLPREDNISOLONE ACETATE 40 MG/ML IJ SUSP
40.0000 mg | Freq: Once | INTRAMUSCULAR | Status: AC
  Administered 2023-11-09: 40 mg via INTRA_ARTICULAR

## 2023-11-09 NOTE — Patient Instructions (Signed)

## 2023-11-09 NOTE — Progress Notes (Signed)
 DATE OF VISIT: 11/09/2023        Kimberly Escobar DOB: 1955-12-24 MRN: [id number]  Discussed the use of AI scribe software for clinical note transcription with the patient, who gave verbal consent to proceed.  History of Present Illness Kimberly Escobar is a 68 year old female with moderate osteoarthritis of both knees who presents with bilateral knee pain.  Bilateral knee pain - Chronic bilateral knee pain, left greater than right - Pain significantly impairs stair use at home - Pain limits participation in choir activities due to fear and discomfort - Occasional nocturnal pain affecting sleep - Grinding sensations in the knees at night - No recent clicking, catching, or popping - Knees infrequently feel like they might give out  Functional impact - Difficulty with stairs at home - Avoidance of choir activities due to knee pain and fear of instability  Prior interventions and medications - Cortisone injections administered 8-10 years ago without significant relief; injection itself was painful - Voltaren  gel used in the past for severe pain, not used recently but available - Topical cream applied to knees at night when pain is bothersome - No current oral medications for knee pain - Takes a natural anti-inflammatory with mild blood-thinning effects  Allergies and medication considerations - Allergic to codeine - Not taking any blood thinners  Imaging and history - Last knee x-rays performed on May 22, 2021 - History of moderate osteoarthritis in both knees    Medications:  Outpatient Encounter Medications as of 11/09/2023  Medication Sig   acyclovir  (ZOVIRAX ) 400 MG tablet Take 1 tablet (400 mg total) by mouth daily as needed.   acyclovir  ointment (ZOVIRAX ) 5 % Apply topically 3 hours as needed   cholecalciferol (VITAMIN D3) 25 MCG (1000 UNIT) tablet Take 1,000 Units by mouth daily.   cyclobenzaprine  (FLEXERIL ) 5 MG tablet Take 1 tablet (5 mg total) by mouth 3 (three)  times daily as needed for muscle spasms.   diclofenac  (VOLTAREN ) 75 MG EC tablet Take 1 tablet by mouth twice daily with food   fluticasone  (FLONASE ) 50 MCG/ACT nasal spray SHAKE LIQUID AND USE 2 SPRAYS IN EACH NOSTRIL DAILY   furosemide  (LASIX ) 40 MG tablet Take 1 tablet by mouth once daily   gabapentin  (NEURONTIN ) 100 MG capsule Take by mouth as needed.   hydroxychloroquine  (PLAQUENIL ) 200 MG tablet TAKE 1 TABLET BY MOUTH TWICE DAILY MONDAY THROUGH FRIDAY   loratadine  (CLARITIN ) 10 MG tablet Take 1 tablet (10 mg total) by mouth daily.   Multiple Vitamins-Minerals (CENTRUM SILVER 50+WOMEN PO) Take by mouth daily.   NONFORMULARY OR COMPOUNDED ITEM Compression stockings  20-30 mm/ hg   dx varicose veins   OMEGA-3 FATTY ACIDS PO Take by mouth as needed.   pantoprazole  (PROTONIX ) 40 MG tablet Take 1 tablet by mouth twice daily   predniSONE  (DELTASONE ) 10 MG tablet Days 1-4: take 6 tabs by mouth daily, days 5-8: take 4 tabs by mouth daily, days 9-12: take 2 tabs by mouth daily   tiZANidine  (ZANAFLEX ) 4 MG tablet Take 1 tablet (4 mg total) by mouth every 6 (six) hours as needed for muscle spasms.   traMADol  (ULTRAM ) 50 MG tablet Take 1 tablet (50 mg total) by mouth every 6 (six) hours as needed.   TURMERIC CURCUMIN PO Take 1 capsule by mouth daily.   No facility-administered encounter medications on file as of 11/09/2023.    Allergies: is allergic to codeine.  Physical Examination: Vitals: BP 126/74   Ht 5' 4.5 (  1.638 m)   Wt 182 lb (82.6 kg)   BMI 30.76 kg/m  GENERAL:  Kimberly Escobar is a 68 y.o. female appearing their stated age, alert and oriented x 3, in no apparent distress.  SKIN: no rashes or lesions, skin clean, dry, intact MSK: KNEE: b/l knee without swelling or effusion.  FROM with pain at terminal flexion.  Mild medial and lateral joint line tenderness bilaterally.  Negative McMurray bilaterally.  Negative Lachman bilaterally.  Negative varus and valgus stress bilaterally.   Walking with a slight limp. Neurovascular intact distally  Radiology: Bilateral knee x-rays 05/22/2021 with the rheumatologist personally reviewed and interpreted by me today showing: - Mild to moderate degenerative changes most prominent in the patellofemoral compartments   Assessment & Plan Bilateral primary osteoarthritis of the knees  Moderate bilateral osteoarthritis with flare-up, more severe on the left. Previous cortisone injections ineffective ~8-10 years ago, but another trial is reasonable. No recent x-rays  - Administer cortisone injections in both knees today, hope they last 3-4 months. - Discuss potential side effects, including steroid flare - Advise rest and avoidance of dedicated exercise for 2-3 days post-injection, then advance activity as tolerated - Recommend using Voltaren  gel on the knees up to 3-4 times a day. - Consider updated x-rays if symptoms worsen. - Follow-up 4 to 6 weeks if no improvement, sooner as needed  PROCEDURE:  Risks & benefits of injection reviewed. Consent obtained. Time-out completed.  Right knee: Patient prepped and draped in the normal fashion. Area cleansed with alcohol. Ethyl chloride spray used to anesthetize the skin. Solution of 4 mL 1% lidocaine  with 1 mL methylprednisolone  (Depo-medrol ) 40mg /mL injected into the right knee using a 22-gauge 1.5-inch needle via the anterior lateral approach. Patient tolerated procedure well without any complications. Area covered with adhesive bandage.  Left knee completed with same procedure. Patient prepped and draped in the normal fashion. Area cleansed with alcohol. Ethyl chloride spray used to anesthetize the skin. Solution of 4 mL 1% lidocaine  with 1 mL methylprednisolone  (Depo-medrol ) 40mg /mL injected into the left knee using a 22-gauge 1.5-inch needle via the anterior medial approach. Patient tolerated procedure well without any complications. Area covered with adhesive bandage. Post-procedure care  reviewed, all questions answered.      Patient expressed understanding & agreement with above.  Encounter Diagnosis  Name Primary?   Primary osteoarthritis of both knees Yes    No orders of the defined types were placed in this encounter.    VISIT SUMMARY: Today, we discussed your chronic bilateral knee pain due to moderate osteoarthritis, which is more severe in your left knee. We reviewed your history, including previous treatments and the impact on your daily activities.  YOUR PLAN: -BILATERAL PRIMARY OSTEOARTHRITIS OF THE KNEES: Osteoarthritis is a condition where the protective cartilage that cushions the ends of your bones wears down over time. We will administer cortisone injections in both knees to provide immediate and longer-term relief. You should rest and avoid dedicated exercise for 2-3 days after the injections. You can also use Voltaren  gel on your knees up to 3-4 times a day. If your symptoms worsen, we may consider updated x-rays.  INSTRUCTIONS: Please rest and avoid dedicated exercise for 2-3 days after your cortisone injections. Use Voltaren  gel on your knees up to 3-4 times a day as needed for pain. If your symptoms worsen, contact us  to discuss the possibility of updated x-rays. Contains text generated by [employer].

## 2023-11-09 NOTE — Addendum Note (Signed)
 Addended by: MARTHA BOUCHARD E on: 11/09/2023 03:22 PM   Modules accepted: Orders

## 2023-11-10 ENCOUNTER — Ambulatory Visit (INDEPENDENT_AMBULATORY_CARE_PROVIDER_SITE_OTHER): Admitting: Family Medicine

## 2023-11-10 VITALS — BP 133/79 | HR 74 | Temp 98.4°F | Resp 12 | Ht 64.5 in | Wt 183.4 lb

## 2023-11-10 DIAGNOSIS — R1033 Periumbilical pain: Secondary | ICD-10-CM | POA: Diagnosis not present

## 2023-11-10 DIAGNOSIS — Z23 Encounter for immunization: Secondary | ICD-10-CM | POA: Diagnosis not present

## 2023-11-10 DIAGNOSIS — F5101 Primary insomnia: Secondary | ICD-10-CM | POA: Diagnosis not present

## 2023-11-10 MED ORDER — TRAZODONE HCL 50 MG PO TABS: 25.0000 mg | ORAL_TABLET | Freq: Every evening | ORAL | 3 refills | Status: AC | PRN

## 2023-11-10 MED ORDER — DICLOFENAC SODIUM 75 MG PO TBEC: DELAYED_RELEASE_TABLET | ORAL | 1 refills | Status: DC
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
## 2023-11-12 ENCOUNTER — Telehealth: Payer: Self-pay

## 2023-11-12 NOTE — Telephone Encounter (Signed)
**Note De-identified  [place] Obfuscation** Please advise 

## 2023-11-12 NOTE — Telephone Encounter (Signed)
 Copied from CRM #[id number]. Topic: Clinical - Medication Question >> Nov 12, 2023 11:25 AM Laymon HERO wrote: Reason for CRM: Patient was told she was going to get a prescription for headaches and was not given anything. Just for sleep and arthritis

## 2023-11-13 NOTE — Telephone Encounter (Signed)
 Pt called. LDVM

## 2023-11-14 ENCOUNTER — Other Ambulatory Visit: Payer: Self-pay | Admitting: Family Medicine

## 2023-11-14 DIAGNOSIS — R600 Localized edema: Secondary | ICD-10-CM

## 2023-11-17 ENCOUNTER — Other Ambulatory Visit: Payer: Self-pay | Admitting: Physician Assistant

## 2023-11-17 DIAGNOSIS — M351 Other overlap syndromes: Secondary | ICD-10-CM

## 2023-11-17 NOTE — Telephone Encounter (Signed)
 Last Fill: 08/21/2023  Eye exam: 08/26/2022   Labs: 09/22/2023 CBC and CMP WNL  ESR WNL Urine protein creatinine ratio WNL Complements WNL  ANA is low titer positive and stable, Smith and RNP antibodies remain positive, double-stranded DNA negative, urine protein creatinine ratio normal, sed rate normal, CBC and CMP normal. Complements normal. Labs do not indicate an autoimmune disease flare.   Next Visit: 03/18/2024  Last Visit: 09/22/2023  DX:Mixed connective tissue disease (HCC), Chronic   Current Dose per office note 09/22/2023: Plaquenil  200 mg 1 tablet by mouth twice daily Monday through Friday.   Attempted to contact the patient and left a message to advise her eye exam is due and to call us  back with the date she had it done or will have it done.   Okay to refill Plaquenil ?

## 2023-11-18 ENCOUNTER — Other Ambulatory Visit (HOSPITAL_BASED_OUTPATIENT_CLINIC_OR_DEPARTMENT_OTHER): Payer: Self-pay

## 2023-11-18 MED ORDER — COMIRNATY 30 MCG/0.3ML IM SUSY
0.3000 mL | PREFILLED_SYRINGE | Freq: Once | INTRAMUSCULAR | 0 refills | Status: AC
  Filled 2023-11-18: qty 0.3, 1d supply, fill #0

## 2023-11-18 MED ORDER — PREVNAR 20 0.5 ML IM SUSY
0.5000 mL | PREFILLED_SYRINGE | Freq: Once | INTRAMUSCULAR | 0 refills | Status: AC
  Filled 2023-11-18: qty 0.5, 1d supply, fill #0

## 2023-11-23 DIAGNOSIS — H25811 Combined forms of age-related cataract, right eye: Secondary | ICD-10-CM | POA: Diagnosis not present

## 2023-12-08 DIAGNOSIS — H25811 Combined forms of age-related cataract, right eye: Secondary | ICD-10-CM | POA: Diagnosis not present

## 2023-12-08 DIAGNOSIS — H268 Other specified cataract: Secondary | ICD-10-CM | POA: Diagnosis not present

## 2023-12-08 HISTORY — PX: CATARACT EXTRACTION: SUR2

## 2023-12-14 DIAGNOSIS — H25812 Combined forms of age-related cataract, left eye: Secondary | ICD-10-CM | POA: Diagnosis not present

## 2023-12-18 ENCOUNTER — Other Ambulatory Visit: Payer: Self-pay | Admitting: Physician Assistant

## 2023-12-18 DIAGNOSIS — M351 Other overlap syndromes: Secondary | ICD-10-CM

## 2023-12-18 NOTE — Telephone Encounter (Signed)
 Patient called the office back and state she is currently completing cataract removal surgery. She had one on the left done already an the right one is coming up , she stated the Dr is going to do the [facility] exam after the removal of the cataracts.

## 2023-12-18 NOTE — Telephone Encounter (Signed)
 Last Fill: 11/17/2023  Eye exam: 08/26/2022 WNL   Labs: 09/22/2023 CBC and CMP WNL  ESR WNL Urine protein creatinine ratio WNL  Next Visit: 03/18/2024  Last Visit: 09/22/2023  DX: Mixed connective tissue disease   Current Dose per office note 09/22/2023: Plaquenil  200 mg 1 tablet by mouth twice daily Monday through Friday.   Okay to refill Plaquenil ?  LMOM for patient to call the office back about her eye exam.

## 2023-12-21 ENCOUNTER — Encounter: Payer: Self-pay | Admitting: Family Medicine

## 2023-12-21 ENCOUNTER — Ambulatory Visit: Admitting: Family Medicine

## 2023-12-21 VITALS — BP 110/70 | HR 74 | Temp 98.4°F | Resp 16 | Ht 64.5 in | Wt 181.2 lb

## 2023-12-21 DIAGNOSIS — E7800 Pure hypercholesterolemia, unspecified: Secondary | ICD-10-CM

## 2023-12-21 DIAGNOSIS — R413 Other amnesia: Secondary | ICD-10-CM

## 2023-12-21 DIAGNOSIS — G8929 Other chronic pain: Secondary | ICD-10-CM | POA: Diagnosis not present

## 2023-12-21 DIAGNOSIS — M329 Systemic lupus erythematosus, unspecified: Secondary | ICD-10-CM | POA: Diagnosis not present

## 2023-12-21 DIAGNOSIS — I10 Essential (primary) hypertension: Secondary | ICD-10-CM

## 2023-12-21 DIAGNOSIS — Z0000 Encounter for general adult medical examination without abnormal findings: Secondary | ICD-10-CM | POA: Diagnosis not present

## 2023-12-21 DIAGNOSIS — M25561 Pain in right knee: Secondary | ICD-10-CM | POA: Diagnosis not present

## 2023-12-21 LAB — COMPREHENSIVE METABOLIC PANEL WITH GFR
ALT: 20 U/L (ref 0–35)
AST: 15 U/L (ref 0–37)
Albumin: 4.5 g/dL (ref 3.5–5.2)
Alkaline Phosphatase: 76 U/L (ref 39–117)
BUN: 16 mg/dL (ref 6–23)
CO2: 28 meq/L (ref 19–32)
Calcium: 9.8 mg/dL (ref 8.4–10.5)
Chloride: 106 meq/L (ref 96–112)
Creatinine, Ser: 0.69 mg/dL (ref 0.40–1.20)
GFR: 88.03 mL/min (ref >60.00)
Glucose, Bld: 88 mg/dL (ref 70–99)
Potassium: 4 meq/L (ref 3.5–5.1)
Sodium: 142 meq/L (ref 135–145)
Total Bilirubin: 0.5 mg/dL (ref 0.2–1.2)
Total Protein: 7.2 g/dL (ref 6.0–8.3)

## 2023-12-21 LAB — CBC WITH DIFFERENTIAL/PLATELET
Basophils Absolute: 0 K/uL (ref 0.0–0.1)
Basophils Relative: 0.9 % (ref 0.0–3.0)
Eosinophils Absolute: 0.1 K/uL (ref 0.0–0.7)
Eosinophils Relative: 1.7 % (ref 0.0–5.0)
HCT: 38.6 % (ref 36.0–46.0)
Hemoglobin: 12.9 g/dL (ref 12.0–15.0)
Lymphocytes Relative: 40.8 % (ref 12.0–46.0)
Lymphs Abs: 2.1 K/uL (ref 0.7–4.0)
MCHC: 33.4 g/dL (ref 30.0–36.0)
MCV: 94.2 fl (ref 78.0–100.0)
Monocytes Absolute: 0.4 K/uL (ref 0.1–1.0)
Monocytes Relative: 7.7 % (ref 3.0–12.0)
Neutro Abs: 2.5 K/uL (ref 1.4–7.7)
Neutrophils Relative %: 48.9 % (ref 43.0–77.0)
Platelets: 242 K/uL (ref 150.0–400.0)
RBC: 4.1 Mil/uL (ref 3.87–5.11)
RDW: 13.3 % (ref 11.5–15.5)
WBC: 5.1 K/uL (ref 4.0–10.5)

## 2023-12-21 LAB — LIPID PANEL
Cholesterol: 168 mg/dL (ref 0–200)
HDL: 57.4 mg/dL (ref >39.00)
LDL Cholesterol: 95 mg/dL (ref 0–99)
NonHDL: 110.88
Total CHOL/HDL Ratio: 3
Triglycerides: 81 mg/dL (ref 0.0–149.0)
VLDL: 16.2 mg/dL (ref 0.0–40.0)

## 2023-12-21 LAB — TSH: TSH: 1.23 u[IU]/mL (ref 0.35–5.50)

## 2023-12-21 NOTE — Patient Instructions (Signed)
 Preventive Care 83 Years and Older, Female Preventive care refers to lifestyle choices and visits with your health care provider that can promote health and wellness. Preventive care visits are also called wellness exams. What can I expect for my preventive care visit? Counseling Your health care provider may ask you questions about your: Medical history, including: Past medical problems. Family medical history. Pregnancy and menstrual history. History of falls. Current health, including: Memory and ability to understand (cognition). Emotional well-being. Home life and relationship well-being. Sexual activity and sexual health. Lifestyle, including: Alcohol, nicotine or tobacco, and drug use. Access to firearms. Diet, exercise, and sleep habits. Work and work Astronomer. Sunscreen use. Safety issues such as seatbelt and bike helmet use. Physical exam Your health care provider will check your: Height and weight. These may be used to calculate your BMI (body mass index). BMI is a measurement that tells if you are at a healthy weight. Waist circumference. This measures the distance around your waistline. This measurement also tells if you are at a healthy weight and may help predict your risk of certain diseases, such as type 2 diabetes and high blood pressure. Heart rate and blood pressure. Body temperature. Skin for abnormal spots. What immunizations do I need?  Vaccines are usually given at various ages, according to a schedule. Your health care provider will recommend vaccines for you based on your age, medical history, and lifestyle or other factors, such as travel or where you work. What tests do I need? Screening Your health care provider may recommend screening tests for certain conditions. This may include: Lipid and cholesterol levels. Hepatitis C test. Hepatitis B test. HIV (human immunodeficiency virus) test. STI (sexually transmitted infection) testing, if you are at  risk. Lung cancer screening. Colorectal cancer screening. Diabetes screening. This is done by checking your blood sugar (glucose) after you have not eaten for a while (fasting). Mammogram. Talk with your health care provider about how often you should have regular mammograms. BRCA-related cancer screening. This may be done if you have a family history of breast, ovarian, tubal, or peritoneal cancers. Bone density scan. This is done to screen for osteoporosis. Talk with your health care provider about your test results, treatment options, and if necessary, the need for more tests. Follow these instructions at home: Eating and drinking  Eat a diet that includes fresh fruits and vegetables, whole grains, lean protein, and low-fat dairy products. Limit your intake of foods with high amounts of sugar, saturated fats, and salt. Take vitamin and mineral supplements as recommended by your health care provider. Do not drink alcohol if your health care provider tells you not to drink. If you drink alcohol: Limit how much you have to 0-1 drink a day. Know how much alcohol is in your drink. In the [place] one drink equals one 12 oz bottle of beer (355 mL), one 5 oz glass of wine (148 mL), or one 1 oz glass of hard liquor (44 mL). Lifestyle Brush your teeth every morning and night with fluoride toothpaste. Floss one time each day. Exercise for at least 30 minutes 5 or more days each week. Do not use any products that contain nicotine or tobacco. These products include cigarettes, chewing tobacco, and vaping devices, such as e-cigarettes. If you need help quitting, ask your health care provider. Do not use drugs. If you are sexually active, practice safe sex. Use a condom or other form of protection in order to prevent STIs. Take aspirin only as told by  your health care provider. Make sure that you understand how much to take and what form to take. Work with your health care provider to find out whether it  is safe and beneficial for you to take aspirin daily. Ask your health care provider if you need to take a cholesterol-lowering medicine (statin). Find healthy ways to manage stress, such as: Meditation, yoga, or listening to music. Journaling. Talking to a trusted person. Spending time with friends and family. Minimize exposure to UV radiation to reduce your risk of skin cancer. Safety Always wear your seat belt while driving or riding in a vehicle. Do not drive: If you have been drinking alcohol. Do not ride with someone who has been drinking. When you are tired or distracted. While texting. If you have been using any mind-altering substances or drugs. Wear a helmet and other protective equipment during sports activities. If you have firearms in your house, make sure you follow all gun safety procedures. What's next? Visit your health care provider once a year for an annual wellness visit. Ask your health care provider how often you should have your eyes and teeth checked. Stay up to date on all vaccines. This information is not intended to replace advice given to you by your health care provider. Make sure you discuss any questions you have with your health care provider. Document Revised: 06/27/2020 Document Reviewed: 06/27/2020 Elsevier Patient Education  2024 [employer].

## 2023-12-21 NOTE — Assessment & Plan Note (Signed)
 Encourage heart healthy diet such as MIND or DASH diet, increase exercise, avoid trans fats, simple carbohydrates and processed foods, consider a krill or fish or flaxseed oil cap daily.

## 2023-12-21 NOTE — Assessment & Plan Note (Signed)
 Ghm utd Check labs See AVS Health Maintenance  Topic Date Due   COVID-19 Vaccine (7 - Mixed Product risk 2025-26 season) 05/17/2024   Fecal DNA (Cologuard)  06/17/2024   Medicare Annual Wellness (AWV)  09/07/2024   Mammogram  11/01/2024   Bone Density Scan  11/01/2025   DTaP/Tdap/Td (3 - Td or Tdap) 12/07/2032   Pneumococcal Vaccine: 50+ Years  Completed   Influenza Vaccine  Completed   Hepatitis C Screening  Completed   Zoster Vaccines- Shingrix  Completed   Meningococcal B Vaccine  Aged Out   Colonoscopy  Discontinued

## 2023-12-21 NOTE — Assessment & Plan Note (Signed)
 Well controlled, no changes to meds. Encouraged heart healthy diet such as the DASH diet and exercise as tolerated.

## 2023-12-21 NOTE — Assessment & Plan Note (Signed)
Per rheum 
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
## 2023-12-22 DIAGNOSIS — H25812 Combined forms of age-related cataract, left eye: Secondary | ICD-10-CM | POA: Diagnosis not present

## 2023-12-22 DIAGNOSIS — H268 Other specified cataract: Secondary | ICD-10-CM | POA: Diagnosis not present

## 2023-12-23 ENCOUNTER — Ambulatory Visit: Admitting: Physician Assistant

## 2023-12-24 ENCOUNTER — Ambulatory Visit: Payer: Self-pay | Admitting: Family Medicine
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
## 2023-12-29 ENCOUNTER — Ambulatory Visit: Attending: Physician Assistant | Admitting: Physician Assistant

## 2023-12-29 ENCOUNTER — Encounter: Payer: Self-pay | Admitting: Physician Assistant

## 2023-12-29 VITALS — BP 120/80 | HR 68 | Temp 97.7°F | Resp 14 | Ht 65.0 in | Wt 184.8 lb

## 2023-12-29 DIAGNOSIS — G4709 Other insomnia: Secondary | ICD-10-CM

## 2023-12-29 DIAGNOSIS — M7062 Trochanteric bursitis, left hip: Secondary | ICD-10-CM

## 2023-12-29 DIAGNOSIS — E785 Hyperlipidemia, unspecified: Secondary | ICD-10-CM

## 2023-12-29 DIAGNOSIS — M797 Fibromyalgia: Secondary | ICD-10-CM

## 2023-12-29 DIAGNOSIS — M940 Chondrocostal junction syndrome [Tietze]: Secondary | ICD-10-CM

## 2023-12-29 DIAGNOSIS — Z8719 Personal history of other diseases of the digestive system: Secondary | ICD-10-CM

## 2023-12-29 DIAGNOSIS — M5030 Other cervical disc degeneration, unspecified cervical region: Secondary | ICD-10-CM

## 2023-12-29 DIAGNOSIS — Z79899 Other long term (current) drug therapy: Secondary | ICD-10-CM

## 2023-12-29 DIAGNOSIS — Z9889 Other specified postprocedural states: Secondary | ICD-10-CM

## 2023-12-29 DIAGNOSIS — M351 Other overlap syndromes: Secondary | ICD-10-CM

## 2023-12-29 DIAGNOSIS — Z8669 Personal history of other diseases of the nervous system and sense organs: Secondary | ICD-10-CM

## 2023-12-29 DIAGNOSIS — I872 Venous insufficiency (chronic) (peripheral): Secondary | ICD-10-CM

## 2023-12-29 DIAGNOSIS — M8589 Other specified disorders of bone density and structure, multiple sites: Secondary | ICD-10-CM

## 2023-12-29 DIAGNOSIS — M19041 Primary osteoarthritis, right hand: Secondary | ICD-10-CM

## 2023-12-29 DIAGNOSIS — M170 Bilateral primary osteoarthritis of knee: Secondary | ICD-10-CM

## 2023-12-29 DIAGNOSIS — G8929 Other chronic pain: Secondary | ICD-10-CM

## 2023-12-30 ENCOUNTER — Ambulatory Visit: Payer: Self-pay | Admitting: Physician Assistant

## 2023-12-30 NOTE — Progress Notes (Signed)
Complements WNL

## 2023-12-30 NOTE — Progress Notes (Signed)
 ESR and CRP WNL-good news!

## 2023-12-30 NOTE — Progress Notes (Signed)
 RNP negative  dsDNA is negative  Smith antibody remains positive

## 2023-12-31 ENCOUNTER — Other Ambulatory Visit: Payer: Self-pay | Admitting: Family Medicine

## 2023-12-31 DIAGNOSIS — R413 Other amnesia: Secondary | ICD-10-CM

## 2023-12-31 LAB — ANTI-SMITH ANTIBODY: ENA SM Ab Ser-aCnc: 2.3 AI — AB

## 2023-12-31 LAB — C3 AND C4
C3 Complement: 141 mg/dL (ref 83–193)
C4 Complement: 22 mg/dL (ref 15–57)

## 2023-12-31 LAB — ANTI-NUCLEAR AB-TITER (ANA TITER): ANA Titer 1: 1:40 {titer} — ABNORMAL HIGH

## 2023-12-31 LAB — ANA: Anti Nuclear Antibody (ANA): POSITIVE — AB

## 2023-12-31 LAB — C-REACTIVE PROTEIN: CRP: <3 mg/L (ref <8.0)

## 2023-12-31 LAB — RNP ANTIBODY: Ribonucleic Protein(ENA) Antibody, IgG: <1 AI

## 2023-12-31 LAB — ANTI-DNA ANTIBODY, DOUBLE-STRANDED: ds DNA Ab: <1 [IU]/mL

## 2023-12-31 LAB — SEDIMENTATION RATE: Sed Rate: 6 mm/h (ref 0–30)

## 2024-01-01 ENCOUNTER — Other Ambulatory Visit: Payer: Self-pay | Admitting: Family Medicine

## 2024-01-01 DIAGNOSIS — H6991 Unspecified Eustachian tube disorder, right ear: Secondary | ICD-10-CM

## 2024-01-01 DIAGNOSIS — R1033 Periumbilical pain: Secondary | ICD-10-CM

## 2024-01-01 NOTE — Progress Notes (Signed)
 ANA remains positive-very low titer.   No changes recommended at this time. Labs are not consistent with a flare.

## 2024-01-18 DIAGNOSIS — M351 Other overlap syndromes: Secondary | ICD-10-CM

## 2024-01-18 NOTE — Telephone Encounter (Signed)
 Last Fill: 12/18/2023 (30 day supply)  Eye exam: 08/26/2022    Labs: 12/29/2023 ANA remains positive-very low titer.   No changes recommended at this time. Labs are not consistent with a flare.   Next Visit: 06/02/2024  Last Visit: 12/29/2023  IK:FRUI (mixed connective tissue disease)   Current Dose per office note on 12/29/2023: Plaquenil  200 mg 1 tablet by mouth twice daily Monday through Friday.   Per telephone encounter on 12/18/2023: Patient called the office back and state she is currently completing cataract removal surgery. She had one on the left done already an the right one is coming up , she stated the Dr is going to do the [facility] exam after the removal of the cataracts.     Okay to refill Plaquenil ?

## 2024-01-29 ENCOUNTER — Other Ambulatory Visit: Payer: Self-pay | Admitting: Gastroenterology

## 2024-03-18 ENCOUNTER — Ambulatory Visit: Admitting: Rheumatology

## 2024-04-20 ENCOUNTER — Institutional Professional Consult (permissible substitution): Admitting: Neurology

## 2024-06-02 ENCOUNTER — Ambulatory Visit: Admitting: Rheumatology

## 2024-09-20 ENCOUNTER — Ambulatory Visit
